# Patient Record
Sex: Male | Born: 1945
Health system: Southern US, Community
[De-identification: ages and names within clinical notes are randomized; demographics above are authoritative.]

## PROBLEM LIST (undated history)

## (undated) DIAGNOSIS — I10 Essential (primary) hypertension: Secondary | ICD-10-CM

## (undated) DIAGNOSIS — E119 Type 2 diabetes mellitus without complications: Secondary | ICD-10-CM

## (undated) DIAGNOSIS — F528 Other sexual dysfunction not due to a substance or known physiological condition: Secondary | ICD-10-CM

## (undated) DIAGNOSIS — H6500 Acute serous otitis media, unspecified ear: Secondary | ICD-10-CM

## (undated) DIAGNOSIS — E669 Obesity, unspecified: Secondary | ICD-10-CM

## (undated) DIAGNOSIS — E785 Hyperlipidemia, unspecified: Secondary | ICD-10-CM

## (undated) DIAGNOSIS — R112 Nausea with vomiting, unspecified: Secondary | ICD-10-CM

## (undated) DIAGNOSIS — M1990 Unspecified osteoarthritis, unspecified site: Secondary | ICD-10-CM

## (undated) DIAGNOSIS — K219 Gastro-esophageal reflux disease without esophagitis: Secondary | ICD-10-CM

## (undated) DIAGNOSIS — Z9889 Other specified postprocedural states: Secondary | ICD-10-CM

## (undated) HISTORY — PX: TONSILLECTOMY: SUR1361

## (undated) HISTORY — DX: Gastro-esophageal reflux disease without esophagitis: K21.9

## (undated) HISTORY — DX: Other sexual dysfunction not due to a substance or known physiological condition: F52.8

## (undated) HISTORY — DX: Essential (primary) hypertension: I10

## (undated) HISTORY — DX: Type 2 diabetes mellitus without complications: E11.9

## (undated) HISTORY — DX: Hyperlipidemia, unspecified: E78.5

## (undated) HISTORY — DX: Obesity, unspecified: E66.9

## (undated) HISTORY — DX: Acute serous otitis media, unspecified ear: H65.00

---

## 1972-12-02 HISTORY — PX: NASAL SEPTUM SURGERY: SHX37

## 2002-12-17 LAB — HM COLONOSCOPY

## 2004-03-21 ENCOUNTER — Ambulatory Visit (HOSPITAL_COMMUNITY): Admission: RE | Admit: 2004-03-21 | Discharge: 2004-03-21 | Payer: Self-pay | Admitting: Internal Medicine

## 2007-12-31 ENCOUNTER — Encounter: Payer: Self-pay | Admitting: Family Medicine

## 2008-01-07 ENCOUNTER — Encounter: Payer: Self-pay | Admitting: Family Medicine

## 2009-03-15 ENCOUNTER — Ambulatory Visit: Payer: Self-pay | Admitting: Family Medicine

## 2009-03-15 LAB — CONVERTED CEMR LAB
ALT: 33 units/L (ref 0–53)
Alkaline Phosphatase: 67 units/L (ref 39–117)
BUN: 20 mg/dL (ref 6–23)
Basophils Relative: 0.3 % (ref 0.0–3.0)
Bilirubin Urine: NEGATIVE
Bilirubin, Direct: 0 mg/dL (ref 0.0–0.3)
Blood in Urine, dipstick: NEGATIVE
Calcium: 10.1 mg/dL (ref 8.4–10.5)
Cholesterol: 237 mg/dL — ABNORMAL HIGH (ref 0–200)
Creatinine, Ser: 1 mg/dL (ref 0.4–1.5)
Direct LDL: 167.7 mg/dL
Eosinophils Absolute: 0.1 10*3/uL (ref 0.0–0.7)
Eosinophils Relative: 1.5 % (ref 0.0–5.0)
Glucose, Urine, Semiquant: NEGATIVE
HDL: 50.2 mg/dL (ref >39.00)
Ketones, urine, test strip: NEGATIVE
Lymphocytes Relative: 27.3 % (ref 12.0–46.0)
Monocytes Absolute: 0.7 10*3/uL (ref 0.1–1.0)
Neutrophils Relative %: 63.4 % (ref 43.0–77.0)
PSA: 0.93 ng/mL (ref 0.10–4.00)
Platelets: 268 10*3/uL (ref 150.0–400.0)
RBC: 5.56 M/uL (ref 4.22–5.81)
Total Bilirubin: 0.9 mg/dL (ref 0.3–1.2)
Total CHOL/HDL Ratio: 5
Total Protein: 7 g/dL (ref 6.0–8.3)
Triglycerides: 132 mg/dL (ref 0.0–149.0)
Urobilinogen, UA: 0.2
VLDL: 26.4 mg/dL (ref 0.0–40.0)
WBC: 9.3 10*3/uL (ref 4.5–10.5)
pH: 7.5

## 2009-03-24 ENCOUNTER — Ambulatory Visit: Payer: Self-pay | Admitting: Family Medicine

## 2009-03-24 DIAGNOSIS — I10 Essential (primary) hypertension: Secondary | ICD-10-CM

## 2009-03-24 DIAGNOSIS — K219 Gastro-esophageal reflux disease without esophagitis: Secondary | ICD-10-CM

## 2009-03-24 HISTORY — DX: Essential (primary) hypertension: I10

## 2009-03-24 HISTORY — DX: Gastro-esophageal reflux disease without esophagitis: K21.9

## 2009-04-24 ENCOUNTER — Telehealth (INDEPENDENT_AMBULATORY_CARE_PROVIDER_SITE_OTHER): Payer: Self-pay | Admitting: *Deleted

## 2009-04-25 ENCOUNTER — Encounter: Payer: Self-pay | Admitting: Family Medicine

## 2009-04-25 DIAGNOSIS — E669 Obesity, unspecified: Secondary | ICD-10-CM

## 2009-04-25 DIAGNOSIS — F528 Other sexual dysfunction not due to a substance or known physiological condition: Secondary | ICD-10-CM

## 2009-04-25 HISTORY — DX: Obesity, unspecified: E66.9

## 2009-04-25 HISTORY — DX: Other sexual dysfunction not due to a substance or known physiological condition: F52.8

## 2010-02-22 ENCOUNTER — Ambulatory Visit: Payer: Self-pay | Admitting: Family Medicine

## 2010-02-22 DIAGNOSIS — H6500 Acute serous otitis media, unspecified ear: Secondary | ICD-10-CM

## 2010-02-22 HISTORY — DX: Acute serous otitis media, unspecified ear: H65.00

## 2010-03-14 ENCOUNTER — Encounter: Payer: Self-pay | Admitting: Family Medicine

## 2010-03-22 ENCOUNTER — Ambulatory Visit: Payer: Self-pay | Admitting: Family Medicine

## 2010-03-22 LAB — CONVERTED CEMR LAB
ALT: 31 U/L
AST: 21 U/L
Albumin: 4.5 g/dL
Alkaline Phosphatase: 52 U/L
BUN: 19 mg/dL
Basophils Absolute: 0 K/uL
Basophils Relative: 0.4 %
Bilirubin Urine: NEGATIVE
Bilirubin, Direct: 0 mg/dL
Blood in Urine, dipstick: NEGATIVE
CO2: 27 meq/L
Calcium: 9.5 mg/dL
Chloride: 105 meq/L
Cholesterol: 240 mg/dL — ABNORMAL HIGH
Creatinine, Ser: 1 mg/dL
Direct LDL: 178.6 mg/dL
Eosinophils Absolute: 0.1 K/uL
Eosinophils Relative: 1.6 %
GFR calc non Af Amer: 79.64 mL/min
Glucose, Bld: 106 mg/dL — ABNORMAL HIGH
Glucose, Urine, Semiquant: NEGATIVE
HCT: 46.8 %
HDL: 47.3 mg/dL
Hemoglobin: 15.9 g/dL
Ketones, urine, test strip: NEGATIVE
Lymphocytes Relative: 30.8 %
Lymphs Abs: 2.3 K/uL
MCHC: 33.9 g/dL
MCV: 87.8 fL
Monocytes Absolute: 0.6 K/uL
Monocytes Relative: 8.1 %
Neutro Abs: 4.4 K/uL
Neutrophils Relative %: 59.1 %
Nitrite: NEGATIVE
PSA: 0.96 ng/mL
Platelets: 274 K/uL
Potassium: 4.1 meq/L
RBC: 5.32 M/uL
RDW: 14.6 %
Sodium: 139 meq/L
Specific Gravity, Urine: 1.02
TSH: 0.98 u[IU]/mL
Testosterone: 378.62 ng/dL
Total Bilirubin: 0.5 mg/dL
Total CHOL/HDL Ratio: 5
Total Protein: 7 g/dL
Triglycerides: 186 mg/dL — ABNORMAL HIGH
Urobilinogen, UA: 0.2
VLDL: 37.2 mg/dL
WBC Urine, dipstick: NEGATIVE
WBC: 7.4 10*3/microliter
pH: 7

## 2010-03-28 ENCOUNTER — Ambulatory Visit: Payer: Self-pay | Admitting: Family Medicine

## 2010-03-28 DIAGNOSIS — E785 Hyperlipidemia, unspecified: Secondary | ICD-10-CM

## 2010-03-28 HISTORY — DX: Hyperlipidemia, unspecified: E78.5

## 2010-04-26 ENCOUNTER — Ambulatory Visit: Payer: Self-pay | Admitting: Family Medicine

## 2010-05-31 ENCOUNTER — Telehealth: Payer: Self-pay | Admitting: Family Medicine

## 2010-07-19 ENCOUNTER — Ambulatory Visit: Payer: Self-pay | Admitting: Family Medicine

## 2011-01-01 NOTE — Assessment & Plan Note (Signed)
Summary: fup//ccm   Vital Signs:  Patient profile:   65 year old male Weight:      220 pounds Temp:     97.8 degrees F oral BP sitting:   138 / 88  (left arm) Cuff size:   regular  Vitals Entered By: Sid Falcon LPN (July 19, 2010 1:10 PM)  Serial Vital Signs/Assessments:  Time      Position  BP       Pulse  Resp  Temp     By                     138/82                         Evelena Peat MD   History of Present Illness: Follow up hypertension. Patient takes Diovan HCTZ. We have suspected white coat syndrome. Home blood pressures reviewed and consistently good control. He brings home cuff with him today and  consistent readings with our cuff here. He feels well. Good energy. No chest pains, dizziness, or palpitations.  Needs refills Viagra.  Allergies: 1)  ! Penicillin V Potassium (Penicillin V Potassium)  Past History:  Past Medical History: Last updated: 04/25/2009 GERD Hypertension Hay fever, allergies erectile dysfunction moderate obesity  Review of Systems      See HPI  Physical Exam  General:  Well-developed,well-nourished,in no acute distress; alert,appropriate and cooperative throughout examination Neck:  No deformities, masses, or tenderness noted. Lungs:  Normal respiratory effort, chest expands symmetrically. Lungs are clear to auscultation, no crackles or wheezes. Heart:  Normal rate and regular rhythm. S1 and S2 normal without gallop, murmur, click, rub or other extra sounds. Extremities:  no edema.   Impression & Recommendations:  Problem # 1:  HYPERTENSION (ICD-401.9) Assessment Unchanged controlled by home readings. His updated medication list for this problem includes:    Diovan Hct 160-12.5 Mg Tabs (Valsartan-hydrochlorothiazide) ..... One by mouth daily  Problem # 2:  ERECTILE DYSFUNCTION (ICD-302.72)  His updated medication list for this problem includes:    Viagra 100 Mg Tabs (Sildenafil citrate) ..... One daily as  needed  Complete Medication List: 1)  Diovan Hct 160-12.5 Mg Tabs (Valsartan-hydrochlorothiazide) .... One by mouth daily 2)  Androgel Pump 1 % Gel (Testosterone) .... Once daily 3)  Fluticasone Propionate 50 Mcg/act Susp (Fluticasone propionate) .... One spray to each nostril once daily 4)  Viagra 100 Mg Tabs (Sildenafil citrate) .... One daily as needed 5)  Betamethasone Valerate 0.1 % Crea (Betamethasone valerate) .... Apply to affected rash two times a day as needed 6)  Omeprazole Magnesium 20.6 (20 Base) Mg Cpdr (Omeprazole magnesium) .... Once daily Prescriptions: VIAGRA 100 MG TABS (SILDENAFIL CITRATE) one daily as needed  #10 x 11   Entered and Authorized by:   Evelena Peat MD   Signed by:   Evelena Peat MD on 07/19/2010   Method used:   Faxed to ...       [facility]       [address]       [place], [place]  [address]       Ph: [phone] or [phone]       Fax: [phone]   RxID:   [id number]

## 2011-01-01 NOTE — Medication Information (Signed)
Summary: Coverage Approval for Androgel  Coverage Approval for Androgel   Imported By: Maryln Gottron 03/20/2010 09:28:39  _____________________________________________________________________  External Attachment:    Type:   Image     Comment:   External Document

## 2011-01-01 NOTE — Assessment & Plan Note (Signed)
Summary: cpx/njr   Vital Signs:  Patient profile:   65 year old male Height:      67.5 inches Weight:      220 pounds BMI:     34.07 O2 Sat:      98 % Temp:     98.5 degrees F oral Pulse rate:   76 / minute Pulse rhythm:   regular Resp:     16 per minute BP sitting:   156 / 88  Vitals Entered By: Lynann Beaver CMA (March 28, 2010 10:41 AM)  Nutrition Counseling: Patient's BMI is greater than 25 and therefore counseled on weight management options. CC: cpx Is Patient Diabetic? No Pain Assessment Patient in pain? no        History of Present Illness: Here for CPE.  Overall doing well. Colonoscopy 2005 normal   Prior Zostavax 2009. Tdap 2009. Not exercisiing regularly.  PMH, SH , AND FH reviewed and no signif changes.  Current Medications (verified): 1)  Diovan 80 Mg Tabs (Valsartan) .... Once Daily 2)  Androgel Pump 1 % Gel (Testosterone) .... Once Daily 3)  Fluticasone Propionate 50 Mcg/act Susp (Fluticasone Propionate) .... One Spray To Each Nostril Once Daily 4)  Viagra 100 Mg Tabs (Sildenafil Citrate) .... One Daily As Needed 5)  Betamethasone Valerate 0.1 % Crea (Betamethasone Valerate) .... Apply To Affected Rash Two Times A Day As Needed 6)  Omeprazole Magnesium 20.6 (20 Base) Mg Cpdr (Omeprazole Magnesium) .... Once Daily  Allergies (verified): 1)  ! Penicillin V Potassium (Penicillin V Potassium)  Past History:  Past Medical History: Last updated: 04/25/2009 GERD Hypertension Hay fever, allergies erectile dysfunction moderate obesity  Family History: Last updated: 03/24/2009 scleroderma mother dementia  father  Social History: Last updated: 03/24/2009 Occupation:  Education Married Never Smoked Alcohol use-no  Risk Factors: Smoking Status: never (03/24/2009)  Review of Systems  The patient denies anorexia, fever, weight loss, weight gain, vision loss, decreased hearing, hoarseness, chest pain, syncope, dyspnea on exertion, peripheral  edema, prolonged cough, headaches, hemoptysis, abdominal pain, melena, hematochezia, severe indigestion/heartburn, hematuria, incontinence, genital sores, muscle weakness, suspicious skin lesions, transient blindness, difficulty walking, depression, unusual weight change, abnormal bleeding, enlarged lymph nodes, and testicular masses.    Physical Exam  General:  Well-developed,well-nourished,in no acute distress; alert,appropriate and cooperative throughout examination Head:  Normocephalic and atraumatic without obvious abnormalities. No apparent alopecia or balding. Eyes:  pupils equal, pupils round, and pupils reactive to light.   Ears:  External ear exam shows no significant lesions or deformities.  Otoscopic examination reveals clear canals, tympanic membranes are intact bilaterally without bulging, retraction, inflammation or discharge. Hearing is grossly normal bilaterally. Mouth:  Oral mucosa and oropharynx without lesions or exudates.  Teeth in good repair. Neck:  No deformities, masses, or tenderness noted. Chest Wall:  No deformities, masses, tenderness or gynecomastia noted. Lungs:  Normal respiratory effort, chest expands symmetrically. Lungs are clear to auscultation, no crackles or wheezes. Heart:  Normal rate and regular rhythm. S1 and S2 normal without gallop, murmur, click, rub or other extra sounds. Abdomen:  Bowel sounds positive,abdomen soft and non-tender without masses, organomegaly or hernias noted. Rectal:  No external abnormalities noted. Normal sphincter tone. No rectal masses or tenderness. Prostate:  Prostate gland firm and smooth, no enlargement, nodularity, tenderness, mass, asymmetry or induration. Msk:  No deformity or scoliosis noted of thoracic or lumbar spine.   Extremities:  No clubbing, cyanosis, edema, or deformity noted with normal full range of motion of all joints.  Neurologic:  alert & oriented X3, cranial nerves II-XII intact, strength normal in all  extremities, and gait normal.   Skin:  Intact without suspicious lesions or rashes Cervical Nodes:  No lymphadenopathy noted Psych:  normally interactive, good eye contact, not anxious appearing, and not depressed appearing.     Impression & Recommendations:  Problem # 1:  ROUTINE GENERAL MEDICAL EXAM@HEALTH  CARE FACL (ICD-V70.0) labs reviewed with pt.  No signif changes since last year.  Needs to lose some weight and work on more regular exercise. Colonoscopy in 4 years.  PVX is given.  EKG sinus rhythm with no acute changes.  Problem # 2:  HYPERTENSION (ICD-401.9) Assessment: Deteriorated Increase Diovan to 160 mg by mouth once daily and reassess in one month. His updated medication list for this problem includes:    Diovan 160 Mg Tabs (Valsartan) ..... One by mouth once daily  Orders: EKG w/ Interpretation (93000)  Complete Medication List: 1)  Diovan 160 Mg Tabs (Valsartan) .... One by mouth once daily 2)  Androgel Pump 1 % Gel (Testosterone) .... Once daily 3)  Fluticasone Propionate 50 Mcg/act Susp (Fluticasone propionate) .... One spray to each nostril once daily 4)  Viagra 100 Mg Tabs (Sildenafil citrate) .... One daily as needed 5)  Betamethasone Valerate 0.1 % Crea (Betamethasone valerate) .... Apply to affected rash two times a day as needed 6)  Omeprazole Magnesium 20.6 (20 Base) Mg Cpdr (Omeprazole magnesium) .... Once daily  Other Orders: Pneumococcal Vaccine (13086) Admin 1st Vaccine (57846)  Patient Instructions: 1)  Please schedule a follow-up appointment in 1 month.  2)  It is important that you exercise reguarly at least 20 minutes 5 times a week. If you develop chest pain, have severe difficulty breathing, or feel very tired, stop exercising immediately and seek medical attention.  3)  You need to lose weight. Consider a lower calorie diet and regular exercise.  Prescriptions: DIOVAN 160 MG TABS (VALSARTAN) one by mouth once daily  #30 x 11   Entered and  Authorized by:   Evelena Peat MD   Signed by:   Evelena Peat MD on 03/28/2010   Method used:   Faxed to ...       [facility]ail[facility]       [address]       [place], [place]  [address]       Ph: [phone] or [phone]       Fax: [phone]   RxID:   [id number]    Preventive Care Screening  Last Pneumovax:    Date:  03/28/2010    Results:  given   Immunizations Administered:  Pneumonia Vaccine:    Vaccine Type: Pneumovax    Site: right deltoid    Mfr: Merck    Dose: 0.5 ml    Route: [place]    Given by: Lynann Beaver CMA    Exp. Date: 03/22/2011    Lot #: [id number]

## 2011-01-01 NOTE — Assessment & Plan Note (Signed)
Summary: ear pain/cb   Vital Signs:  Patient profile:   65 year old male BP sitting:   140 / 82  (left arm) Cuff size:   regular  Vitals Entered By: Sid Falcon LPN (February 22, 2010 8:43 AM) CC: left ear pain, wax build-up?   History of Present Illness: L ear pain for 2 weeks.  Rx Z-pack at Urgent care 2 weeks ago. No better.  Sensation of ear closing up off and on.  moderate pain.  No signif vertigo.  ?mild hearing loss.  No fever  Needs refill androderm and to schedule CPE.  Allergies: 1)  ! Penicillin V Potassium (Penicillin V Potassium)  Past History:  Past Medical History: Last updated: 04/25/2009 GERD Hypertension Hay fever, allergies erectile dysfunction moderate obesity PMH reviewed for relevance  Review of Systems      See HPI  Physical Exam  General:  Well-developed,well-nourished,in no acute distress; alert,appropriate and cooperative throughout examination Ears:  minimal cerumen canals.  minimal L effusion o/w normal. Nose:  External nasal examination shows no deformity or inflammation. Nasal mucosa are pink and moist without lesions or exudates. Mouth:  Oral mucosa and oropharynx without lesions or exudates.  Teeth in good repair.   Impression & Recommendations:  Problem # 1:  ACUTE SEROUS OTITIS MEDIA (ICD-381.01) Assessment New add allegra to his Flonase. ?allergic precipitant.  No supperative changes on exam.  Problem # 2:  Preventive Health Care (ICD-V70.0) scedule CPE.  Complete Medication List: 1)  Diovan 80 Mg Tabs (Valsartan) .... Once daily 2)  Androgel Pump 1 % Gel (Testosterone) .... Once daily 3)  Fluticasone Propionate 50 Mcg/act Susp (Fluticasone propionate) .... One spray to each nostril once daily 4)  Viagra 100 Mg Tabs (Sildenafil citrate) .... One daily as needed 5)  Betamethasone Valerate 0.1 % Crea (Betamethasone valerate) .... Apply to affected rash two times a day as needed 6)  Omeprazole Magnesium 20.6 (20 Base) Mg Cpdr  (Omeprazole magnesium) .... Once daily  Patient Instructions: 1)  Please schedule a follow-up appointment in 1 month.  2)  Schedule CPE then and add tetosterone level:  302.72 3)  Add Allegra once daily for ear symptoms. Prescriptions: ANDROGEL PUMP 1 % GEL (TESTOSTERONE) once daily  #1 month x 11   Entered and Authorized by:   Evelena Peat MD   Signed by:   Evelena Peat MD on 02/22/2010   Method used:   Print then Give to Patient   RxID:   [id number]

## 2011-01-01 NOTE — Progress Notes (Signed)
Summary: BP readings  Phone Note Call from Patient   Caller: Patient Call For: Evelena Peat MD Summary of Call: BP 156/83, 148/80  averaging the same. Headache today. [phone]  [phone] Initial call taken by: Lynann Beaver CMA,  May 31, 2010 1:58 PM  Follow-up for Phone Call        change to Diovan HCTZ 160/12.5 mg one by mouth once daily and pt needs office follow up within one month to reassess. Follow-up by: Evelena Peat MD,  May 31, 2010 6:16 PM    New/Updated Medications: DIOVAN HCT 160-12.5 MG TABS (VALSARTAN-HYDROCHLOROTHIAZIDE) one by mouth daily Prescriptions: DIOVAN HCT 160-12.5 MG TABS (VALSARTAN-HYDROCHLOROTHIAZIDE) one by mouth daily  #30 x 6   Entered by:   Lynann Beaver CMA   Authorized by:   Evelena Peat MD   Signed by:   Lynann Beaver CMA on 06/01/2010   Method used:   Faxed to ...       [facility] (retail)       [address]       [place], [place]  [address]       Ph: [phone] or [phone]       Fax: [phone]   RxID:   [id number]  Pt notified.

## 2011-01-01 NOTE — Assessment & Plan Note (Signed)
Summary: Follow up/ns/cb   Vital Signs:  Patient profile:   65 year old male Temp:     98.0 degrees F oral BP sitting:   158 / 92  (left arm) Cuff size:   large  Vitals Entered By: Sid Falcon LPN (Apr 26, 2010 1:16 PM)  Serial Vital Signs/Assessments:  Time      Position  BP       Pulse  Resp  Temp     By                     150/82                         Evelena Peat MD  CC: BP med check, Hypertension Management   History of Present Illness: Patient here for followup hypertension. Well controlled by home readings with 130 systolic and around 80 diastolic. No side effects from increased dosage of Diovan. Compliant with therapy. Exercising more and has lost a few pounds. Denies chest pains or dizziness.  Hypertension History:      He denies headache, chest pain, palpitations, dyspnea with exertion, orthopnea, peripheral edema, visual symptoms, neurologic problems, and syncope.        Positive major cardiovascular risk factors include male age 51 years old or older, hyperlipidemia, and hypertension.  Negative major cardiovascular risk factors include non-tobacco-user status.     Allergies: 1)  ! Penicillin V Potassium (Penicillin V Potassium)  Past History:  Past Medical History: Last updated: 04/25/2009 GERD Hypertension Hay fever, allergies erectile dysfunction moderate obesity  Review of Systems      See HPI  Physical Exam  General:  Well-developed,well-nourished,in no acute distress; alert,appropriate and cooperative throughout examination Mouth:  Oral mucosa and oropharynx without lesions or exudates.  Teeth in good repair. Neck:  No deformities, masses, or tenderness noted. Lungs:  Normal respiratory effort, chest expands symmetrically. Lungs are clear to auscultation, no crackles or wheezes. Heart:  Normal rate and regular rhythm. S1 and S2 normal without gallop, murmur, click, rub or other extra sounds.   Impression & Recommendations:  Problem # 1:   HYPERTENSION (ICD-401.9) probable whitecoat syndrome. Continue close monitoring at home. Reassess in 3 months. His updated medication list for this problem includes:    Diovan 160 Mg Tabs (Valsartan) ..... One by mouth once daily  Complete Medication List: 1)  Diovan 160 Mg Tabs (Valsartan) .... One by mouth once daily 2)  Androgel Pump 1 % Gel (Testosterone) .... Once daily 3)  Fluticasone Propionate 50 Mcg/act Susp (Fluticasone propionate) .... One spray to each nostril once daily 4)  Viagra 100 Mg Tabs (Sildenafil citrate) .... One daily as needed 5)  Betamethasone Valerate 0.1 % Crea (Betamethasone valerate) .... Apply to affected rash two times a day as needed 6)  Omeprazole Magnesium 20.6 (20 Base) Mg Cpdr (Omeprazole magnesium) .... Once daily  Hypertension Assessment/Plan:      The patient's hypertensive risk group is category B: At least one risk factor (excluding diabetes) with no target organ damage.  Today's blood pressure is 158/92.    Patient Instructions: 1)  Please schedule a follow-up appointment in 3 months .  2)  It is important that you exercise reguarly at least 20 minutes 5 times a week. If you develop chest pain, have severe difficulty breathing, or feel very tired, stop exercising immediately and seek medical attention.

## 2011-02-07 ENCOUNTER — Other Ambulatory Visit: Payer: Self-pay | Admitting: Family Medicine

## 2011-04-19 NOTE — Op Note (Signed)
NAME:  Clifford Gibson, Clifford Gibson                       ACCOUNT NO.:  [id number]   MEDICAL RECORD NO.:  [id number]                   PATIENT TYPE:  AMB   LOCATION:  DAY                                  FACILITY:  [facility]   PHYSICIAN:  Lionel December, M.D.                 DATE OF BIRTH:  05-23-1946   DATE OF PROCEDURE:  03/21/2004  DATE OF DISCHARGE:                                 OPERATIVE REPORT   PROCEDURE:  Total colonoscopy.   INDICATIONS FOR PROCEDURE:  Effie is a 65 year old Caucasian male who is  here for screening colonoscopy.  His family history is negative for  colorectal carcinoma.  The procedure and risks were reviewed with the  patient, and informed consent was obtained.   PREOPERATIVE MEDICATIONS:  Demerol 25 mg IV, Versed 4 mg IV.   FINDINGS:  The procedure was performed in the endoscopy suite.  The  patient's vital signs and O2 saturations were monitored during the  procedure.  He was noted to have single PVCs.  He had a run of bigeminy  without any symptoms.  He did tell us that he has history of PVCs.   The patient was placed in the left lateral recumbent position and rectal  examination performed.  No abnormality noted on external or digital exam.  The Olympus videoscope was placed into the rectum and advanced into the  region of the sigmoid colon and beyond.  The preparation was excellent.  The  scope was passed to the cecum which was identified by the appendiceal  orifice and ileocecal valve.  As the scope was withdrawn, the colonic mucosa  was once again carefully examined, and no polyps, tumor masses, or other  abnormalities were noted.  The rectal mucosa similarly was normal.  The  scope was retroflexed to examine the anorectal junction, and thickened anal  papillae were noted.  The endoscope was straightened and withdrawn.  The  patient tolerated the procedure well.   FINAL DIAGNOSIS:  Normal colonoscopy.   RECOMMENDATIONS:  1. He should continue yearly  Hemoccults.  2. He may consider next screening exam in 10 years from now.      ___________________________________________                                            Lionel December, M.D.   NR/MEDQ  D:  03/21/2004  T:  03/21/2004  Job:  [id number]   cc:   Weyman Pedro, M.D.  [place], [place]

## 2011-06-17 ENCOUNTER — Ambulatory Visit (INDEPENDENT_AMBULATORY_CARE_PROVIDER_SITE_OTHER): Payer: Medicare Other | Admitting: Family Medicine

## 2011-06-17 ENCOUNTER — Encounter: Payer: Self-pay | Admitting: Family Medicine

## 2011-06-17 VITALS — BP 128/82 | Temp 98.3°F | Wt 228.0 lb

## 2011-06-17 DIAGNOSIS — R42 Dizziness and giddiness: Secondary | ICD-10-CM

## 2011-06-17 MED ORDER — VALSARTAN-HYDROCHLOROTHIAZIDE 160-12.5 MG PO TABS: 1.0000 | ORAL_TABLET | Freq: Every day | ORAL | Status: DC

## 2011-06-17 NOTE — Progress Notes (Signed)
  Subjective:    Patient ID: Clifford Gibson, male    DOB: 1946-03-28, 65 y.o.   MRN: [id number]  HPI Patient seen with intermittent vertigo for several years. About 40 years ago had initial episode. Now has occurrences which usually last 2-3 days. Occasional nausea but no vomiting. No hearing changes. No tinnitus. Has taken meclizine and scopolamine in past but had sedation with both. Has previously seen ENT surgeons with no successful treatment. Question history acute vestibular neuritis versus acute labyrinthitis initially several years ago. No syncopal or presyncopal symptoms. Denies recent headache.   Review of Systems  Constitutional: Negative for fever, chills, fatigue and unexpected weight change.  HENT: Negative for hearing loss, ear pain, tinnitus and ear discharge.   Cardiovascular: Negative for chest pain.  Neurological: Positive for dizziness. Negative for tremors, syncope, weakness, light-headedness and headaches.  Hematological: Negative for adenopathy.       Objective:   Physical Exam  Constitutional: He is oriented to person, place, and time. He appears well-developed and well-nourished. No distress.  Cardiovascular: Normal rate, regular rhythm and normal heart sounds.   Pulmonary/Chest: Effort normal and breath sounds normal. No respiratory distress. He has no wheezes. He has no rales.  Neurological: He is alert and oriented to person, place, and time. No cranial nerve deficit. He exhibits normal muscle tone.       Cerebellar normal by finger to nose testing No focal strength deficits  Psychiatric: He has a normal mood and affect. His behavior is normal.          Assessment & Plan:  Chronic recurrent vertigo probably following acute vestibular neuritis several years ago. Discussed medication options for flare but limitations of sedation with each. Doubt that he would benefit from vestibular rehabilitation as episodes are acute and very limited.  No red flags for  acute change of symptoms

## 2011-06-17 NOTE — Patient Instructions (Signed)
Follow up promptly for any hearing loss or any new symptoms.

## 2011-09-18 ENCOUNTER — Other Ambulatory Visit: Payer: Self-pay | Admitting: Family Medicine

## 2011-10-25 ENCOUNTER — Encounter: Payer: Self-pay | Admitting: Family Medicine

## 2011-10-25 ENCOUNTER — Ambulatory Visit (INDEPENDENT_AMBULATORY_CARE_PROVIDER_SITE_OTHER): Payer: Medicare Other | Admitting: Family Medicine

## 2011-10-25 DIAGNOSIS — I10 Essential (primary) hypertension: Secondary | ICD-10-CM

## 2011-10-25 DIAGNOSIS — Z0000 Encounter for general adult medical examination without abnormal findings: Secondary | ICD-10-CM

## 2011-10-25 DIAGNOSIS — E785 Hyperlipidemia, unspecified: Secondary | ICD-10-CM

## 2011-10-25 DIAGNOSIS — K219 Gastro-esophageal reflux disease without esophagitis: Secondary | ICD-10-CM

## 2011-10-25 LAB — HEPATIC FUNCTION PANEL
ALT: 29 U/L (ref 0–53)
AST: 21 U/L (ref 0–37)
Bilirubin, Direct: 0 mg/dL (ref 0.0–0.3)
Total Bilirubin: 0.6 mg/dL (ref 0.3–1.2)
Total Protein: 7.1 g/dL (ref 6.0–8.3)

## 2011-10-25 LAB — BASIC METABOLIC PANEL
BUN: 18 mg/dL (ref 6–23)
Chloride: 103 mEq/L (ref 96–112)
Glucose, Bld: 125 mg/dL — ABNORMAL HIGH (ref 70–99)
Potassium: 5 mEq/L (ref 3.5–5.1)
Sodium: 141 mEq/L (ref 135–145)

## 2011-10-25 LAB — LIPID PANEL
Cholesterol: 223 mg/dL — ABNORMAL HIGH (ref 0–200)
HDL: 49.2 mg/dL (ref >39.00)
Total CHOL/HDL Ratio: 5
VLDL: 36.2 mg/dL (ref 0.0–40.0)

## 2011-10-25 LAB — LDL CHOLESTEROL, DIRECT: Direct LDL: 162.8 mg/dL

## 2011-10-25 NOTE — Progress Notes (Signed)
Subjective:    Patient ID: Clifford Gibson, male    DOB: 1946-02-17, 65 y.o.   MRN: [id number]  HPI  Patient seen for Medicare wellness exam and medical followup. Medical problems include hyperlipidemia, obesity, prediabetes, erectile dysfunction, hypertension, and GERD. Medications reviewed. Compliant with all. Blood pressure stable. No symptoms of hyperglycemia.  Denies side effects from medications.  No consistent exercise.  Immunizations reviewed. All up to date. Colonoscopy 2004. Not exercising regularly.  1.  Risk factors based on Past Medical , Social, and Family history reviewed as below 2.  Limitations in physical activities no limitation in activities. Low risk for fall 3.  Depression/mood no depression or anxiety issues 4.  Hearing no deficits 5.  ADLs fully independent in all 6.  Cognitive function (orientation to time and place, language, writing, speech,memory) no short or long-term memory deficits. Language intact. Judgment intact 7.  Home Safety no issues identified 8.  Height, weight, and visual acuity. Height and weight stable. Vision stable 9.  Counseling needs to lose weight and exercise more. 10. Recommendation of preventive services. Immunizations up to date. Discussed importance of more regular exercise. 11. Labs based on risk factors based metabolic panel, lipid panel, and hepatic panel 12. Care Plan as above  Past Medical History  Diagnosis Date  . HYPERLIPIDEMIA 03/28/2010  . OBESITY, MODERATE 04/25/2009  . ERECTILE DYSFUNCTION 04/25/2009  . Acute serous otitis media 02/22/2010  . HYPERTENSION 03/24/2009  . GERD 03/24/2009   No past surgical history on file.  reports that he has never smoked. He has never used smokeless tobacco. He reports that he does not drink alcohol. His drug history not on file. family history includes Dementia in his father and Scleroderma in his mother. Allergies  Allergen Reactions  . Penicillins        Review of Systems    Constitutional: Negative for fever, activity change, appetite change and fatigue.  HENT: Negative for ear pain, congestion and trouble swallowing.   Eyes: Negative for pain and visual disturbance.  Respiratory: Negative for cough, shortness of breath and wheezing.   Cardiovascular: Negative for chest pain and palpitations.  Gastrointestinal: Negative for nausea, vomiting, abdominal pain, diarrhea, constipation, blood in stool, abdominal distention and rectal pain.  Genitourinary: Negative for dysuria, hematuria and testicular pain.  Musculoskeletal: Negative for joint swelling and arthralgias.  Skin: Negative for rash.  Neurological: Negative for dizziness, syncope and headaches.  Hematological: Negative for adenopathy.  Psychiatric/Behavioral: Negative for confusion and dysphoric mood.       Objective:   Physical Exam  Constitutional: He is oriented to person, place, and time. He appears well-developed and well-nourished. No distress.  HENT:  Head: Normocephalic and atraumatic.  Right Ear: External ear normal.  Left Ear: External ear normal.  Mouth/Throat: Oropharynx is clear and moist.  Eyes: Conjunctivae and EOM are normal. Pupils are equal, round, and reactive to light.  Neck: Normal range of motion. Neck supple. No thyromegaly present.  Cardiovascular: Normal rate, regular rhythm and normal heart sounds.   No murmur heard. Pulmonary/Chest: No respiratory distress. He has no wheezes. He has no rales.  Abdominal: Soft. Bowel sounds are normal. He exhibits no distension and no mass. There is no tenderness. There is no rebound and no guarding.  Musculoskeletal: He exhibits no edema.  Lymphadenopathy:    He has no cervical adenopathy.  Neurological: He is alert and oriented to person, place, and time. He displays normal reflexes. No cranial nerve deficit.  Skin: No rash noted.  Psychiatric: He has a normal mood and affect.          Assessment & Plan:  #1 health  maintenance. Immunizations up to date. Discussed need for weight loss and more consistent exercise. Obtain screening labs #2 hyperlipidemia. Check lipid and hepatic panel  #3 hypertension stable continue Diovan HCTZ.  #4 GERD stable continue omeprazole. #5 history of prediabetes. Reassess fasting sugar. Work on weight loss.

## 2011-10-25 NOTE — Patient Instructions (Signed)
Work on weight loss.

## 2011-10-28 NOTE — Progress Notes (Signed)
Quick Note:  Pt aware ______ 

## 2011-10-28 NOTE — Progress Notes (Signed)
Quick Note:  Left a message for return call. ______ 

## 2012-03-05 ENCOUNTER — Encounter: Payer: Self-pay | Admitting: Family Medicine

## 2012-03-05 ENCOUNTER — Ambulatory Visit (INDEPENDENT_AMBULATORY_CARE_PROVIDER_SITE_OTHER): Payer: Medicare Other | Admitting: Family Medicine

## 2012-03-05 ENCOUNTER — Ambulatory Visit (INDEPENDENT_AMBULATORY_CARE_PROVIDER_SITE_OTHER)
Admission: RE | Admit: 2012-03-05 | Discharge: 2012-03-05 | Disposition: A | Discharge status: Home / Self Care | Payer: Medicare Other | Source: Ambulatory Visit | Attending: Family Medicine | Admitting: Family Medicine

## 2012-03-05 VITALS — BP 150/84 | Temp 98.0°F | Wt 231.0 lb

## 2012-03-05 DIAGNOSIS — M25461 Effusion, right knee: Secondary | ICD-10-CM

## 2012-03-05 DIAGNOSIS — M25469 Effusion, unspecified knee: Secondary | ICD-10-CM

## 2012-03-05 MED ORDER — DICLOFENAC SODIUM 75 MG PO TBEC: 75.0000 mg | DELAYED_RELEASE_TABLET | Freq: Two times a day (BID) | ORAL | Status: DC

## 2012-03-05 NOTE — Progress Notes (Signed)
  Subjective:    Patient ID: Clifford Gibson, male    DOB: [birth year], 66 y.o.   MRN: [id number]  HPI  Right knee pain. Onset in January. No injury. Pain is medial aspect of knee. Moderate severity without radiation. Possibly small edema but no erythema or warmth. No ecchymosis. Pain actually improves somewhat with walking. Occasional Aleve with some relief. No locking or giving way. No prior history of knee difficulty.   Review of Systems  Constitutional: Negative for fever and chills.  Skin: Negative for rash.       Objective:   Physical Exam  Constitutional: He appears well-developed and well-nourished.  Cardiovascular: Normal rate and regular rhythm.   Pulmonary/Chest: Effort normal and breath sounds normal. No respiratory distress. He has no wheezes. He has no rales.  Musculoskeletal:       Right knee reveals small effusion. No ecchymosis. No erythema. No definite warmth. Full range of motion with some crepitus with flexion and extension. Mild to moderate medial joint line tenderness. Ligament testing is normal.          Assessment & Plan:  Small right knee effusion. Differential is degenerative arthritis versus medial meniscus tear. Plain x-rays. Diclofenac 75 mg twice a day with food. Reassess 2 weeks

## 2012-03-19 ENCOUNTER — Encounter: Payer: Self-pay | Admitting: Family Medicine

## 2012-03-19 ENCOUNTER — Ambulatory Visit (INDEPENDENT_AMBULATORY_CARE_PROVIDER_SITE_OTHER): Payer: Medicare Other | Admitting: Family Medicine

## 2012-03-19 VITALS — BP 140/90 | Temp 98.6°F | Wt 231.0 lb

## 2012-03-19 DIAGNOSIS — S83241A Other tear of medial meniscus, current injury, right knee, initial encounter: Secondary | ICD-10-CM

## 2012-03-19 DIAGNOSIS — M25561 Pain in right knee: Secondary | ICD-10-CM

## 2012-03-19 DIAGNOSIS — IMO0002 Reserved for concepts with insufficient information to code with codable children: Secondary | ICD-10-CM

## 2012-03-19 DIAGNOSIS — M25569 Pain in unspecified knee: Secondary | ICD-10-CM

## 2012-03-19 NOTE — Progress Notes (Addendum)
  Subjective:    Patient ID: Clifford Gibson, male    DOB: 01-15-1946, 66 y.o.   MRN: [id number]  HPI  Persistent right knee pain and effusion. This has gone on now for several months. No specific injury. No improvement with NSAIDS. Location is medial. Effusion is not improved. No locking or giving way. Pain has been somewhat progressive since last visit. Moderate pain without radiation.  Past Medical History  Diagnosis Date  . HYPERLIPIDEMIA 03/28/2010  . OBESITY, MODERATE 04/25/2009  . ERECTILE DYSFUNCTION 04/25/2009  . Acute serous otitis media 02/22/2010  . HYPERTENSION 03/24/2009  . GERD 03/24/2009   No past surgical history on file.  reports that he has never smoked. He has never used smokeless tobacco. He reports that he does not drink alcohol. His drug history not on file. family history includes Dementia in his father and Scleroderma in his mother. Allergies  Allergen Reactions  . Penicillins       Review of Systems  Constitutional: Negative for fever and chills.  Musculoskeletal: Positive for joint swelling. Negative for gait problem.       Objective:   Physical Exam  Constitutional: He appears well-developed and well-nourished.  Cardiovascular: Normal rate and regular rhythm.   Musculoskeletal:       Right knee reveals small effusion. Full range of motion. Medial joint line tenderness. Ligament testing is normal. No erythema. Possibly mild warmth.          Assessment & Plan:  Persistent right knee pain and effusion. Rule out medial meniscal tear. Set up MRI scan. May need orthopedic referral.  MRI large R medial meniscus post horn tear. Will set up orthopedic referral.

## 2012-03-20 ENCOUNTER — Ambulatory Visit
Admission: RE | Admit: 2012-03-20 | Discharge: 2012-03-20 | Disposition: A | Discharge status: Home / Self Care | Payer: Medicare Other | Source: Ambulatory Visit | Attending: Family Medicine | Admitting: Family Medicine

## 2012-03-20 DIAGNOSIS — M25561 Pain in right knee: Secondary | ICD-10-CM

## 2012-03-22 NOTE — Progress Notes (Signed)
Addended by: Kristian Covey on: 03/22/2012 12:02 PM   Modules accepted: Orders

## 2012-05-05 ENCOUNTER — Other Ambulatory Visit: Payer: Self-pay | Admitting: Family Medicine

## 2012-05-06 ENCOUNTER — Encounter: Payer: Self-pay | Admitting: Family Medicine

## 2012-05-06 ENCOUNTER — Ambulatory Visit (INDEPENDENT_AMBULATORY_CARE_PROVIDER_SITE_OTHER)
Admission: RE | Admit: 2012-05-06 | Discharge: 2012-05-06 | Disposition: A | Discharge status: Home / Self Care | Payer: Medicare Other | Source: Ambulatory Visit | Attending: Family Medicine | Admitting: Family Medicine

## 2012-05-06 ENCOUNTER — Ambulatory Visit (INDEPENDENT_AMBULATORY_CARE_PROVIDER_SITE_OTHER): Payer: Medicare Other | Admitting: Family Medicine

## 2012-05-06 VITALS — BP 130/60 | HR 80 | Temp 97.8°F | Resp 12 | Wt 230.0 lb

## 2012-05-06 DIAGNOSIS — K219 Gastro-esophageal reflux disease without esophagitis: Secondary | ICD-10-CM

## 2012-05-06 DIAGNOSIS — E785 Hyperlipidemia, unspecified: Secondary | ICD-10-CM

## 2012-05-06 DIAGNOSIS — Z01818 Encounter for other preprocedural examination: Secondary | ICD-10-CM

## 2012-05-06 DIAGNOSIS — I517 Cardiomegaly: Secondary | ICD-10-CM

## 2012-05-06 DIAGNOSIS — I10 Essential (primary) hypertension: Secondary | ICD-10-CM

## 2012-05-06 NOTE — Progress Notes (Addendum)
  Subjective:    Patient ID: Clifford Gibson, male    DOB: February 22, 1946, 66 y.o.   MRN: [id number]  HPI  Patient here for preoperative assessment.. Presented here with right knee injury. X-rays did not show any major degenerative changes but he had ongoing medial knee pain. MRI scan revealed large posterior horn meniscus tear. Patient referred to orthopedist. Apparently had significant loss of joint space with standing views of the knee. He has been scheduled for right total knee replacement. His chronic problems include history of obesity, hypertension, GERD, hyperlipidemia. No cardiac history. No lung problems. No history of smoking. Blood pressures have been well controlled.  Medications reviewed. Compliant with all.  Past Medical History  Diagnosis Date  . HYPERLIPIDEMIA 03/28/2010  . OBESITY, MODERATE 04/25/2009  . ERECTILE DYSFUNCTION 04/25/2009  . Acute serous otitis media 02/22/2010  . HYPERTENSION 03/24/2009  . GERD 03/24/2009   No past surgical history on file.  reports that he has never smoked. He has never used smokeless tobacco. He reports that he does not drink alcohol. His drug history not on file. family history includes Dementia in his father and Scleroderma in his mother. Allergies  Allergen Reactions  . Penicillins       Review of Systems  Constitutional: Negative for fever, chills, appetite change, fatigue and unexpected weight change.  Eyes: Negative for visual disturbance.  Respiratory: Negative for cough, chest tightness and shortness of breath.   Cardiovascular: Negative for chest pain, palpitations and leg swelling.  Gastrointestinal: Negative for abdominal pain.  Neurological: Negative for dizziness, syncope, weakness, light-headedness and headaches.       Objective:   Physical Exam  Constitutional: He is oriented to person, place, and time. He appears well-developed and well-nourished.  HENT:  Mouth/Throat: Oropharynx is clear and moist.  Neck: Neck  supple. No thyromegaly present.       No carotid bruits  Cardiovascular: Normal rate and regular rhythm.  Exam reveals no gallop.   No murmur heard. Pulmonary/Chest: Effort normal and breath sounds normal. No respiratory distress. He has no wheezes. He has no rales.  Abdominal: Soft. He exhibits no distension and no mass. There is no tenderness. There is no rebound and no guarding.  Musculoskeletal: He exhibits no edema.  Lymphadenopathy:    He has no cervical adenopathy.  Neurological: He is alert and oriented to person, place, and time. No cranial nerve deficit.  Psychiatric: He has a normal mood and affect. His behavior is normal.          Assessment & Plan:  Preoperative assessment. Blood pressure well controlled. EKG no acute changes compared to previous tracing 2011. Obtain PA and lateral chest x-ray. No contraindications noted for surgery  Chest x-ray shows cardiomegaly, though poor inspiration. Patient asymptomatic. Set up echocardiogram prior to elective surgery.

## 2012-05-08 NOTE — Progress Notes (Signed)
Addended by: Kristian Covey on: 05/08/2012 08:29 AM   Modules accepted: Orders

## 2012-05-11 ENCOUNTER — Ambulatory Visit (HOSPITAL_COMMUNITY): Payer: Medicare Other | Attending: Cardiology | Admitting: Radiology

## 2012-05-11 DIAGNOSIS — I10 Essential (primary) hypertension: Secondary | ICD-10-CM | POA: Insufficient documentation

## 2012-05-11 DIAGNOSIS — E785 Hyperlipidemia, unspecified: Secondary | ICD-10-CM | POA: Insufficient documentation

## 2012-05-11 DIAGNOSIS — I517 Cardiomegaly: Secondary | ICD-10-CM

## 2012-05-11 DIAGNOSIS — Z01818 Encounter for other preprocedural examination: Secondary | ICD-10-CM

## 2012-05-11 NOTE — Progress Notes (Signed)
Echocardiogram performed.  

## 2012-05-15 ENCOUNTER — Other Ambulatory Visit: Payer: Self-pay | Admitting: Otolaryngology

## 2012-05-29 ENCOUNTER — Encounter (HOSPITAL_COMMUNITY): Payer: Self-pay | Admitting: Pharmacy Technician

## 2012-06-03 ENCOUNTER — Encounter (HOSPITAL_COMMUNITY): Payer: Self-pay

## 2012-06-03 ENCOUNTER — Encounter (HOSPITAL_COMMUNITY)
Admission: RE | Admit: 2012-06-03 | Discharge: 2012-06-03 | Disposition: A | Discharge status: Home / Self Care | Payer: Medicare Other | Source: Ambulatory Visit | Attending: Specialist | Admitting: Specialist

## 2012-06-03 ENCOUNTER — Ambulatory Visit (HOSPITAL_COMMUNITY)
Admission: RE | Admit: 2012-06-03 | Discharge: 2012-06-03 | Disposition: A | Discharge status: Home / Self Care | Payer: Medicare Other | Source: Ambulatory Visit | Attending: Otolaryngology | Admitting: Otolaryngology

## 2012-06-03 DIAGNOSIS — Z01818 Encounter for other preprocedural examination: Secondary | ICD-10-CM | POA: Insufficient documentation

## 2012-06-03 DIAGNOSIS — Z01812 Encounter for preprocedural laboratory examination: Secondary | ICD-10-CM | POA: Insufficient documentation

## 2012-06-03 DIAGNOSIS — M1710 Unilateral primary osteoarthritis, unspecified knee: Secondary | ICD-10-CM | POA: Insufficient documentation

## 2012-06-03 HISTORY — DX: Unspecified osteoarthritis, unspecified site: M19.90

## 2012-06-03 HISTORY — DX: Other specified postprocedural states: R11.2

## 2012-06-03 HISTORY — DX: Other specified postprocedural states: Z98.890

## 2012-06-03 LAB — URINALYSIS, ROUTINE W REFLEX MICROSCOPIC
Glucose, UA: NEGATIVE mg/dL
Leukocytes, UA: NEGATIVE
Nitrite: NEGATIVE
Specific Gravity, Urine: 1.02 (ref 1.005–1.030)
pH: 7 (ref 5.0–8.0)

## 2012-06-03 LAB — CBC
HCT: 44.5 % (ref 39.0–52.0)
MCHC: 34.2 g/dL (ref 30.0–36.0)
Platelets: 296 10*3/uL (ref 150–400)
RDW: 14.3 % (ref 11.5–15.5)
WBC: 9 10*3/uL (ref 4.0–10.5)

## 2012-06-03 LAB — DIFFERENTIAL
Eosinophils Absolute: 0.1 10*3/uL (ref 0.0–0.7)
Lymphs Abs: 2.5 10*3/uL (ref 0.7–4.0)
Monocytes Absolute: 1 10*3/uL (ref 0.1–1.0)
Monocytes Relative: 11 % (ref 3–12)
Neutrophils Relative %: 59 % (ref 43–77)

## 2012-06-03 LAB — COMPREHENSIVE METABOLIC PANEL
AST: 18 U/L (ref 0–37)
Albumin: 4.4 g/dL (ref 3.5–5.2)
Alkaline Phosphatase: 67 U/L (ref 39–117)
BUN: 20 mg/dL (ref 6–23)
Chloride: 100 mEq/L (ref 96–112)
Potassium: 4.3 mEq/L (ref 3.5–5.1)
Sodium: 138 mEq/L (ref 135–145)
Total Bilirubin: 0.4 mg/dL (ref 0.3–1.2)
Total Protein: 7.1 g/dL (ref 6.0–8.3)

## 2012-06-03 LAB — PROTIME-INR: INR: 0.99 (ref 0.00–1.49)

## 2012-06-03 NOTE — Patient Instructions (Signed)
20 Clifford Gibson  06/03/2012   Your procedure is scheduled on:  Thursday 06/11/2012 at 0730am  Report to [facility] at 500 AM.  Call this number if you have problems the morning of surgery: [phone]   Remember:   Do not eat food:After Midnight.  May have clear liquids:until Midnight .  Marland Kitchen  Take these medicines the morning of surgery with A SIP OF WATER: prilosec, use Flonase nasal spray if needed   Do not wear jewelry  Do not wear lotions, powders, or perfumes.   Do not shave 48 hours prior to surgery. Men may shave face and neck.  Do not bring valuables to the hospital.  Contacts, dentures or bridgework may not be worn into surgery.  Leave suitcase in the car. After surgery it may be brought to your room.  For patients admitted to the hospital, checkout time is 11:00 AM the day of discharge.       Special Instructions: CHG Shower Use Special Wash: 1/2 bottle night before surgery and 1/2 bottle morning of surgery.   Please read over the following fact sheets that you were given: MRSA Information, Blood Transfusion sheet, Incentive Spirometry sheet, Sleep apnea sheet               Call me [name], BSN at [phone] if any questions

## 2012-06-03 NOTE — Progress Notes (Signed)
06/03/12 1053  OBSTRUCTIVE SLEEP APNEA  Have you ever been diagnosed with sleep apnea through a sleep study? No  Do you snore loudly (loud enough to be heard through closed doors)?  1  Do you often feel tired, fatigued, or sleepy during the daytime? 0  Has anyone observed you stop breathing during your sleep? 0  Do you have, or are you being treated for high blood pressure? 1  BMI more than 35 kg/m2? 0  Age over 67 years old? 1  Neck circumference greater than 40 cm/18 inches? 0  Gender: 1  Obstructive Sleep Apnea Score 4   Score 4 or greater  Updated health history

## 2012-06-03 NOTE — Pre-Procedure Instructions (Signed)
Reviewed pre-op instructions with patient using Teach back method. 

## 2012-06-04 NOTE — H&P (Signed)
Chief Complaint:  right knee pain.  Subjective: Patient is admitted for right total knee arthroplasty.  Patient is a 66 y.o. male who has been seen by Dr. Shelle Iron for ongoing hip pain.  They have been followed and found to have continued progressive pain.  They have been treated conservatively in the past including medications.  Despite conservative measures, they continue to have pain.  X-rays show that the patient has bone on bone arthritis of the right knee.  It is felt that they would benefit from undergoing surgical intervention.  Risks and benefits have been discussed with the patient and they elect to proceed with surgery.  The patient has no contraindications to the upcoming procedure such as ongoing infection or progressive neurological disease.  Allergies: Allergies  Allergen Reactions  . Penicillins Other (See Comments)    Reaction=bleeding,diarrhea     Medications: Diovan 160 mg daily Fluticasone Propionate one spray daily 1% Andorgel daily Omeprazole 20.6mg  daily Vitamins Flax Oil Omega 3 fish oil DHA  Magnesium Baby Aspirin Vitamin C  Past Medical History: Past Medical History  Diagnosis Date  . HYPERLIPIDEMIA 03/28/2010  . OBESITY, MODERATE 04/25/2009  . ERECTILE DYSFUNCTION 04/25/2009  . Acute serous otitis media 02/22/2010  . HYPERTENSION 03/24/2009  . GERD 03/24/2009  . PONV (postoperative nausea and vomiting)   . Arthritis      Past Surgical History: Past Surgical History  Procedure Date  . Nasal septum surgery 1974  . Tonsillectomy     3 yrs. old     Family History: Family History  Problem Relation Age of Onset  . Scleroderma Mother   . Dementia Father     Social History: History  Substance Use Topics  . Smoking status: Never Smoker   . Smokeless tobacco: Never Used  . Alcohol Use: No     Review of Systems A comprehensive review of systems was negative.  Physical Exam:  Vitals: Pulse:76 Respirations:10 Blood  Pressure:163/87  General appearance: alert and mild distress Head: Normocephalic, without obvious abnormality, atraumatic Lungs: clear to auscultation bilaterally Heart: regular rate and rhythm, S1, S2 normal, no murmur, click, rub or gallop Abdomen: soft, non-tender; bowel sounds normal; no masses,  no organomegaly Extremities: mild effusion right knee, TTP MJL, -3 to 90 Skin: Skin color, texture, turgor normal. No rashes or lesions  Assessment/Plan: End stage arthritis, right knee  The patient is being admitted to [facility] to undergo a right total knee arthroplasty.  Surgery will be performed by Dr. Jene Every.  Risks and benefits have been discussed with the patient and they elect to proceed wth the procedure.  Patient plans to go home after surgery.

## 2012-06-11 ENCOUNTER — Encounter (HOSPITAL_COMMUNITY): Payer: Self-pay | Admitting: Anesthesiology

## 2012-06-11 ENCOUNTER — Inpatient Hospital Stay (HOSPITAL_COMMUNITY)
Admission: RE | Admit: 2012-06-11 | Discharge: 2012-06-14 | DRG: 470 | Disposition: A | Discharge status: Home Health | Payer: Medicare Other | Source: Ambulatory Visit | Attending: Specialist | Admitting: Specialist

## 2012-06-11 ENCOUNTER — Ambulatory Visit (HOSPITAL_COMMUNITY): Payer: Medicare Other | Admitting: Anesthesiology

## 2012-06-11 ENCOUNTER — Encounter (HOSPITAL_COMMUNITY): Payer: Self-pay | Admitting: *Deleted

## 2012-06-11 ENCOUNTER — Encounter (HOSPITAL_COMMUNITY)
Admission: RE | Disposition: A | Discharge status: Home Health | Payer: Self-pay | Source: Ambulatory Visit | Attending: Specialist

## 2012-06-11 DIAGNOSIS — M1710 Unilateral primary osteoarthritis, unspecified knee: Secondary | ICD-10-CM

## 2012-06-11 DIAGNOSIS — I10 Essential (primary) hypertension: Secondary | ICD-10-CM | POA: Diagnosis present

## 2012-06-11 DIAGNOSIS — E669 Obesity, unspecified: Secondary | ICD-10-CM | POA: Diagnosis present

## 2012-06-11 HISTORY — PX: TOTAL KNEE ARTHROPLASTY: SHX125

## 2012-06-11 LAB — TYPE AND SCREEN: Antibody Screen: NEGATIVE

## 2012-06-11 SURGERY — ARTHROPLASTY, KNEE, TOTAL
Anesthesia: General | Site: Knee | Laterality: Right | Wound class: Clean

## 2012-06-11 MED ORDER — IRBESARTAN 150 MG PO TABS
150.0000 mg | ORAL_TABLET | Freq: Every day | ORAL | Status: DC
  Administered 2012-06-11 – 2012-06-13 (×3): 150 mg via ORAL
  Filled 2012-06-11 (×4): qty 1

## 2012-06-11 MED ORDER — VANCOMYCIN HCL IN DEXTROSE 1-5 GM/200ML-% IV SOLN
1000.0000 mg | Freq: Two times a day (BID) | INTRAVENOUS | Status: AC
  Administered 2012-06-12 (×3): 1000 mg via INTRAVENOUS
  Filled 2012-06-11 (×3): qty 200

## 2012-06-11 MED ORDER — SODIUM CHLORIDE 0.9 % IR SOLN
Status: DC | PRN
  Administered 2012-06-11: 08:00:00

## 2012-06-11 MED ORDER — MENTHOL 3 MG MT LOZG: 1.0000 | LOZENGE | OROMUCOSAL | Status: DC | PRN

## 2012-06-11 MED ORDER — PROMETHAZINE HCL 25 MG/ML IJ SOLN
6.2500 mg | INTRAMUSCULAR | Status: DC | PRN
  Administered 2012-06-11: 12.5 mg via INTRAVENOUS

## 2012-06-11 MED ORDER — 0.9 % SODIUM CHLORIDE (POUR BTL) OPTIME
TOPICAL | Status: DC | PRN
  Administered 2012-06-11: 1000 mL

## 2012-06-11 MED ORDER — SUFENTANIL CITRATE 50 MCG/ML IV SOLN
INTRAVENOUS | Status: DC | PRN
  Administered 2012-06-11: 10 ug via INTRAVENOUS
  Administered 2012-06-11 (×2): 5 ug via INTRAVENOUS
  Administered 2012-06-11: 10 ug via INTRAVENOUS
  Administered 2012-06-11: 20 ug via INTRAVENOUS

## 2012-06-11 MED ORDER — METHOCARBAMOL 100 MG/ML IJ SOLN
500.0000 mg | Freq: Four times a day (QID) | INTRAVENOUS | Status: DC | PRN
  Administered 2012-06-11: 500 mg via INTRAVENOUS
  Filled 2012-06-11 (×2): qty 5

## 2012-06-11 MED ORDER — SODIUM CHLORIDE 0.45 % IV SOLN
INTRAVENOUS | Status: DC
  Administered 2012-06-11 – 2012-06-12 (×2): via INTRAVENOUS

## 2012-06-11 MED ORDER — ONDANSETRON HCL 4 MG/2ML IJ SOLN
INTRAMUSCULAR | Status: DC | PRN
  Administered 2012-06-11: 4 mg via INTRAVENOUS

## 2012-06-11 MED ORDER — ACETAMINOPHEN 650 MG RE SUPP: 650.0000 mg | Freq: Four times a day (QID) | RECTAL | Status: DC | PRN

## 2012-06-11 MED ORDER — BUPIVACAINE-EPINEPHRINE (PF) 0.5% -1:200000 IJ SOLN
INTRAMUSCULAR | Status: AC
Start: 2012-06-11 — End: 2012-06-11
  Filled 2012-06-11: qty 10

## 2012-06-11 MED ORDER — ACETAMINOPHEN 325 MG PO TABS: 650.0000 mg | ORAL_TABLET | Freq: Four times a day (QID) | ORAL | Status: DC | PRN

## 2012-06-11 MED ORDER — ACETAMINOPHEN 10 MG/ML IV SOLN
INTRAVENOUS | Status: AC
  Filled 2012-06-11: qty 100

## 2012-06-11 MED ORDER — PROPOFOL 10 MG/ML IV EMUL
INTRAVENOUS | Status: DC | PRN
  Administered 2012-06-11: 150 mg via INTRAVENOUS

## 2012-06-11 MED ORDER — HYDROMORPHONE HCL PF 1 MG/ML IJ SOLN
1.0000 mg | INTRAMUSCULAR | Status: DC | PRN
  Administered 2012-06-11 (×2): 1 mg via INTRAVENOUS
  Administered 2012-06-11: 0.5 mg via INTRAVENOUS
  Administered 2012-06-12: 1 mg via INTRAVENOUS
  Filled 2012-06-11 (×4): qty 1

## 2012-06-11 MED ORDER — ROCURONIUM BROMIDE 100 MG/10ML IV SOLN
INTRAVENOUS | Status: DC | PRN
  Administered 2012-06-11: 40 mg via INTRAVENOUS

## 2012-06-11 MED ORDER — RIVAROXABAN 10 MG PO TABS
10.0000 mg | ORAL_TABLET | Freq: Every day | ORAL | Status: DC
  Administered 2012-06-12 – 2012-06-14 (×3): 10 mg via ORAL
  Filled 2012-06-11 (×4): qty 1

## 2012-06-11 MED ORDER — ACETAMINOPHEN 10 MG/ML IV SOLN
INTRAVENOUS | Status: DC | PRN
  Administered 2012-06-11: 1000 mg via INTRAVENOUS

## 2012-06-11 MED ORDER — LACTATED RINGERS IV SOLN
INTRAVENOUS | Status: DC
  Administered 2012-06-11 (×3): via INTRAVENOUS

## 2012-06-11 MED ORDER — VALSARTAN-HYDROCHLOROTHIAZIDE 160-12.5 MG PO TABS: 1.0000 | ORAL_TABLET | Freq: Every day | ORAL | Status: DC

## 2012-06-11 MED ORDER — OXYCODONE HCL 5 MG PO TABS
5.0000 mg | ORAL_TABLET | ORAL | Status: DC | PRN
  Administered 2012-06-12: 5 mg via ORAL
  Administered 2012-06-12: 10 mg via ORAL
  Administered 2012-06-12: 5 mg via ORAL
  Administered 2012-06-12 – 2012-06-13 (×5): 10 mg via ORAL
  Filled 2012-06-11: qty 1
  Filled 2012-06-11 (×2): qty 2
  Filled 2012-06-11: qty 1
  Filled 2012-06-11 (×4): qty 2

## 2012-06-11 MED ORDER — SODIUM CHLORIDE 0.9 % IR SOLN
Status: DC | PRN
  Administered 2012-06-11: 3000 mL

## 2012-06-11 MED ORDER — METOCLOPRAMIDE HCL 10 MG PO TABS
5.0000 mg | ORAL_TABLET | Freq: Three times a day (TID) | ORAL | Status: DC | PRN
  Administered 2012-06-12: 5 mg via ORAL
  Filled 2012-06-11: qty 1

## 2012-06-11 MED ORDER — BUPIVACAINE-EPINEPHRINE 0.5% -1:200000 IJ SOLN
INTRAMUSCULAR | Status: DC | PRN
  Administered 2012-06-11: 25 mL

## 2012-06-11 MED ORDER — PANTOPRAZOLE SODIUM 40 MG PO TBEC
40.0000 mg | DELAYED_RELEASE_TABLET | Freq: Every day | ORAL | Status: DC
  Administered 2012-06-12 – 2012-06-13 (×2): 40 mg via ORAL
  Filled 2012-06-11 (×4): qty 1

## 2012-06-11 MED ORDER — HYDROCHLOROTHIAZIDE 12.5 MG PO CAPS
12.5000 mg | ORAL_CAPSULE | Freq: Every day | ORAL | Status: DC
  Administered 2012-06-11 – 2012-06-13 (×3): 12.5 mg via ORAL
  Filled 2012-06-11 (×4): qty 1

## 2012-06-11 MED ORDER — METOCLOPRAMIDE HCL 5 MG/ML IJ SOLN: 5.0000 mg | Freq: Three times a day (TID) | INTRAMUSCULAR | Status: DC | PRN

## 2012-06-11 MED ORDER — CIPROFLOXACIN IN D5W 400 MG/200ML IV SOLN
400.0000 mg | Freq: Two times a day (BID) | INTRAVENOUS | Status: AC
  Administered 2012-06-11 – 2012-06-12 (×2): 400 mg via INTRAVENOUS
  Filled 2012-06-11 (×2): qty 200

## 2012-06-11 MED ORDER — MIDAZOLAM HCL 5 MG/5ML IJ SOLN
INTRAMUSCULAR | Status: DC | PRN
  Administered 2012-06-11: 2 mg via INTRAVENOUS

## 2012-06-11 MED ORDER — CIPROFLOXACIN IN D5W 400 MG/200ML IV SOLN
400.0000 mg | INTRAVENOUS | Status: AC
  Administered 2012-06-11: 400 mg via INTRAVENOUS

## 2012-06-11 MED ORDER — PROMETHAZINE HCL 25 MG/ML IJ SOLN
INTRAMUSCULAR | Status: AC
  Filled 2012-06-11: qty 1

## 2012-06-11 MED ORDER — CIPROFLOXACIN IN D5W 400 MG/200ML IV SOLN
INTRAVENOUS | Status: AC
Start: 2012-06-11 — End: 2012-06-11
  Filled 2012-06-11: qty 200

## 2012-06-11 MED ORDER — PHENOL 1.4 % MT LIQD: 1.0000 | OROMUCOSAL | Status: DC | PRN

## 2012-06-11 MED ORDER — ONDANSETRON HCL 4 MG PO TABS
4.0000 mg | ORAL_TABLET | Freq: Four times a day (QID) | ORAL | Status: DC | PRN
  Administered 2012-06-12 (×2): 4 mg via ORAL
  Filled 2012-06-11 (×2): qty 1

## 2012-06-11 MED ORDER — ONDANSETRON HCL 4 MG/2ML IJ SOLN
4.0000 mg | Freq: Four times a day (QID) | INTRAMUSCULAR | Status: DC | PRN
  Administered 2012-06-13: 4 mg via INTRAVENOUS
  Filled 2012-06-11: qty 2

## 2012-06-11 MED ORDER — DEXAMETHASONE SODIUM PHOSPHATE 4 MG/ML IJ SOLN
INTRAMUSCULAR | Status: DC | PRN
  Administered 2012-06-11: 10 mg via INTRAVENOUS

## 2012-06-11 MED ORDER — VANCOMYCIN HCL 1000 MG IV SOLR
1500.0000 mg | INTRAVENOUS | Status: AC
  Administered 2012-06-11: 1500 mg via INTRAVENOUS
  Filled 2012-06-11: qty 1500

## 2012-06-11 MED ORDER — METHOCARBAMOL 500 MG PO TABS
500.0000 mg | ORAL_TABLET | Freq: Four times a day (QID) | ORAL | Status: DC | PRN
  Administered 2012-06-12 (×3): 500 mg via ORAL
  Filled 2012-06-11 (×3): qty 1

## 2012-06-11 MED ORDER — DOCUSATE SODIUM 100 MG PO CAPS
100.0000 mg | ORAL_CAPSULE | Freq: Two times a day (BID) | ORAL | Status: DC
  Administered 2012-06-11 – 2012-06-14 (×6): 100 mg via ORAL

## 2012-06-11 MED ORDER — HYDROMORPHONE HCL PF 1 MG/ML IJ SOLN
INTRAMUSCULAR | Status: AC
  Filled 2012-06-11: qty 1

## 2012-06-11 MED ORDER — LIDOCAINE HCL (CARDIAC) 20 MG/ML IV SOLN
INTRAVENOUS | Status: DC | PRN
  Administered 2012-06-11: 100 mg via INTRAVENOUS

## 2012-06-11 MED ORDER — FLUTICASONE PROPIONATE 50 MCG/ACT NA SUSP
1.0000 | Freq: Every day | NASAL | Status: DC
  Administered 2012-06-12 – 2012-06-13 (×2): 1 via NASAL
  Filled 2012-06-11: qty 16

## 2012-06-11 MED ORDER — HYDROMORPHONE HCL PF 1 MG/ML IJ SOLN
0.2500 mg | INTRAMUSCULAR | Status: DC | PRN
Start: 2012-06-11 — End: 2012-06-11
  Administered 2012-06-11 (×2): 0.5 mg via INTRAVENOUS

## 2012-06-11 MED ORDER — MECLIZINE HCL 25 MG PO TABS
25.0000 mg | ORAL_TABLET | Freq: Three times a day (TID) | ORAL | Status: DC | PRN
  Filled 2012-06-11: qty 1

## 2012-06-11 MED ORDER — DROPERIDOL 2.5 MG/ML IJ SOLN
INTRAMUSCULAR | Status: DC | PRN
  Administered 2012-06-11: .625 mg via INTRAVENOUS

## 2012-06-11 SURGICAL SUPPLY — 65 items
BAG SPEC THK2 15X12 ZIP CLS (MISCELLANEOUS) ×1
BAG ZIPLOCK 12X15 (MISCELLANEOUS) ×2 IMPLANT
BANDAGE ELASTIC 4 VELCRO ST LF (GAUZE/BANDAGES/DRESSINGS) ×2 IMPLANT
BANDAGE ELASTIC 6 VELCRO ST LF (GAUZE/BANDAGES/DRESSINGS) ×2 IMPLANT
BANDAGE ESMARK 6X9 LF (GAUZE/BANDAGES/DRESSINGS) ×1 IMPLANT
BLADE SAG 18X100X1.27 (BLADE) ×2 IMPLANT
BLADE SAW SGTL 13.0X1.19X90.0M (BLADE) ×2 IMPLANT
BNDG CMPR 9X6 STRL LF SNTH (GAUZE/BANDAGES/DRESSINGS) ×1
BNDG ESMARK 6X9 LF (GAUZE/BANDAGES/DRESSINGS) ×2
CEMENT HV SMART SET (Cement) ×4 IMPLANT
CHLORAPREP W/TINT 26ML (MISCELLANEOUS) IMPLANT
CLOTH BEACON ORANGE TIMEOUT ST (SAFETY) ×2 IMPLANT
CUFF TOURN SGL QUICK 34 (TOURNIQUET CUFF) ×2
CUFF TRNQT CYL 34X4X40X1 (TOURNIQUET CUFF) ×1 IMPLANT
DECANTER SPIKE VIAL GLASS SM (MISCELLANEOUS) ×2 IMPLANT
DRAPE INCISE IOBAN 66X45 STRL (DRAPES) ×1 IMPLANT
DRAPE LG THREE QUARTER DISP (DRAPES) ×2 IMPLANT
DRAPE ORTHO SPLIT 77X108 STRL (DRAPES) ×4
DRAPE POUCH INSTRU U-SHP 10X18 (DRAPES) ×2 IMPLANT
DRAPE SURG ORHT 6 SPLT 77X108 (DRAPES) ×2 IMPLANT
DRAPE U-SHAPE 47X51 STRL (DRAPES) ×2 IMPLANT
DRSG ADAPTIC 3X8 NADH LF (GAUZE/BANDAGES/DRESSINGS) ×2 IMPLANT
DRSG PAD ABDOMINAL 8X10 ST (GAUZE/BANDAGES/DRESSINGS) ×2 IMPLANT
DURAPREP 26ML APPLICATOR (WOUND CARE) ×2 IMPLANT
ELECT REM PT RETURN 9FT ADLT (ELECTROSURGICAL) ×2
ELECTRODE REM PT RTRN 9FT ADLT (ELECTROSURGICAL) ×1 IMPLANT
EVACUATOR 1/8 PVC DRAIN (DRAIN) ×2 IMPLANT
FACESHIELD LNG OPTICON STERILE (SAFETY) ×10 IMPLANT
GLOVE BIO SURGEON STRL SZ 6.5 (GLOVE) ×2 IMPLANT
GLOVE BIOGEL PI IND STRL 8 (GLOVE) ×1 IMPLANT
GLOVE BIOGEL PI INDICATOR 8 (GLOVE) ×1
GLOVE ECLIPSE 6.5 STRL STRAW (GLOVE) ×2 IMPLANT
GLOVE INDICATOR 6.5 STRL GRN (GLOVE) ×2 IMPLANT
GLOVE SURG SS PI 8.0 STRL IVOR (GLOVE) ×4 IMPLANT
GOWN PREVENTION PLUS LG XLONG (DISPOSABLE) ×2 IMPLANT
GOWN PREVENTION PLUS XLARGE (GOWN DISPOSABLE) ×3 IMPLANT
GOWN STRL REIN XL XLG (GOWN DISPOSABLE) ×3 IMPLANT
HANDPIECE INTERPULSE COAX TIP (DISPOSABLE) ×2
IMMOBILIZER KNEE 20 (SOFTGOODS) ×2
IMMOBILIZER KNEE 20 THIGH 36 (SOFTGOODS) ×1 IMPLANT
KIT BASIN OR (CUSTOM PROCEDURE TRAY) ×2 IMPLANT
MANIFOLD NEPTUNE II (INSTRUMENTS) ×2 IMPLANT
NS IRRIG 1000ML POUR BTL (IV SOLUTION) ×2 IMPLANT
PACK TOTAL JOINT (CUSTOM PROCEDURE TRAY) ×2 IMPLANT
PADDING CAST COTTON 6X4 STRL (CAST SUPPLIES) ×2 IMPLANT
POSITIONER SURGICAL ARM (MISCELLANEOUS) ×2 IMPLANT
SET HNDPC FAN SPRY TIP SCT (DISPOSABLE) ×1 IMPLANT
SPONGE GAUZE 4X4 12PLY (GAUZE/BANDAGES/DRESSINGS) ×1 IMPLANT
SPONGE SURGIFOAM ABS GEL 100 (HEMOSTASIS) ×2 IMPLANT
STAPLER VISISTAT (STAPLE) ×2 IMPLANT
STRIP CLOSURE SKIN 1/2X4 (GAUZE/BANDAGES/DRESSINGS) ×1 IMPLANT
SUCTION FRAZIER 12FR DISP (SUCTIONS) ×2 IMPLANT
SUT BONE WAX W31G (SUTURE) ×2 IMPLANT
SUT MNCRL AB 4-0 PS2 18 (SUTURE) ×1 IMPLANT
SUT VIC AB 1 CT1 27 (SUTURE) ×8
SUT VIC AB 1 CT1 27XBRD ANTBC (SUTURE) ×4 IMPLANT
SUT VIC AB 2-0 CT1 27 (SUTURE) ×6
SUT VIC AB 2-0 CT1 TAPERPNT 27 (SUTURE) ×3 IMPLANT
SUT VLOC 180 0 24IN GS25 (SUTURE) ×2 IMPLANT
SYR 30ML LL (SYRINGE) ×2 IMPLANT
SYR BULB IRRIGATION 50ML (SYRINGE) ×1 IMPLANT
TOWER CARTRIDGE SMART MIX (DISPOSABLE) ×2 IMPLANT
TRAY FOLEY CATH 14FRSI W/METER (CATHETERS) ×2 IMPLANT
WATER STERILE IRR 1500ML POUR (IV SOLUTION) ×2 IMPLANT
WRAP KNEE MAXI GEL POST OP (GAUZE/BANDAGES/DRESSINGS) ×2 IMPLANT

## 2012-06-11 NOTE — Anesthesia Preprocedure Evaluation (Signed)
Anesthesia Evaluation  Patient identified by MRN, date of birth, ID band Patient awake    Reviewed: Allergy & Precautions, H&P , NPO status , Patient's Chart, lab work & pertinent test results, reviewed documented beta blocker date and time   Airway Mallampati: III TM Distance: >3 FB Neck ROM: Full    Dental  (+) Teeth Intact and Dental Advisory Given   Pulmonary neg pulmonary ROS,  breath sounds clear to auscultation        Cardiovascular hypertension, Pt. on medications Rhythm:Regular Rate:Normal  Denies cardiac symptoms   Neuro/Psych negative neurological ROS  negative psych ROS   GI/Hepatic negative GI ROS, Neg liver ROS,   Endo/Other  negative endocrine ROS  Renal/GU negative Renal ROS  negative genitourinary   Musculoskeletal   Abdominal   Peds negative pediatric ROS (+)  Hematology negative hematology ROS (+)   Anesthesia Other Findings   Reproductive/Obstetrics negative OB ROS                           Anesthesia Physical Anesthesia Plan  ASA: II  Anesthesia Plan: General   Post-op Pain Management:    Induction: Intravenous  Airway Management Planned: Oral ETT  Additional Equipment:   Intra-op Plan:   Post-operative Plan: Extubation in OR  Informed Consent: I have reviewed the patients History and Physical, chart, labs and discussed the procedure including the risks, benefits and alternatives for the proposed anesthesia with the patient or authorized representative who has indicated his/her understanding and acceptance.   Dental advisory given  Plan Discussed with: CRNA and Surgeon  Anesthesia Plan Comments:         Anesthesia Quick Evaluation

## 2012-06-11 NOTE — Progress Notes (Signed)
UR COMPLETED  

## 2012-06-11 NOTE — Interval H&P Note (Signed)
History and Physical Interval Note:  06/11/2012 6:41 AM  Clifford Gibson  has presented today for surgery, with the diagnosis of djd right knee   The various methods of treatment have been discussed with the patient and family. After consideration of risks, benefits and other options for treatment, the patient has consented to  Procedure(s) (LRB): TOTAL KNEE ARTHROPLASTY (Right) as a surgical intervention .  The patient's history has been reviewed, patient examined, no change in status, stable for surgery.  I have reviewed the patients' chart and labs.  Questions were answered to the patient's satisfaction.     Secret Kristensen C

## 2012-06-11 NOTE — Brief Op Note (Signed)
06/11/2012  9:41 AM  PATIENT:  Clifford Gibson  66 y.o. male  PRE-OPERATIVE DIAGNOSIS:  degenerative joint disease right knee   POST-OPERATIVE DIAGNOSIS:  degenerative joint disease right knee  PROCEDURE:  Procedure(s) (LRB): TOTAL KNEE ARTHROPLASTY (Right)  SURGEON:  Surgeon(s) and Role:    * Javier Docker, MD - Primary  PHYSICIAN ASSISTANT:   ASSISTANTS: Tech   ANESTHESIA:   general  EBL:  Total I/O In: 1000 [I.V.:1000] Out: 300 [Urine:200; Blood:100]  BLOOD ADMINISTERED:none  DRAINS: (1) Hemovact drain(s) in the open with  Suction Open   LOCAL MEDICATIONS USED:  MARCAINE     SPECIMEN:  No Specimen  DISPOSITION OF SPECIMEN:  N/A  COUNTS:  YES  TOURNIQUET:   Total Tourniquet Time Documented: Thigh (Right) - 91 minutes  DICTATION: .Other Dictation: Dictation Number [id number]  PLAN OF CARE: Admit to inpatient   PATIENT DISPOSITION:  PACU - hemodynamically stable.   Delay start of Pharmacological VTE agent (>24hrs) due to surgical blood loss or risk of bleeding: no

## 2012-06-11 NOTE — Anesthesia Postprocedure Evaluation (Signed)
  Anesthesia Post-op Note  Patient: Clifford Gibson  Procedure(s) Performed: Procedure(s) (LRB): TOTAL KNEE ARTHROPLASTY (Right)  Patient Location: PACU  Anesthesia Type: General  Level of Consciousness: oriented and sedated  Airway and Oxygen Therapy: Patient Spontanous Breathing and Patient connected to nasal cannula oxygen  Post-op Pain: mild  Post-op Assessment: Post-op Vital signs reviewed, Patient's Cardiovascular Status Stable, Respiratory Function Stable and Patent Airway  Post-op Vital Signs: stable  Complications: No apparent anesthesia complications 

## 2012-06-11 NOTE — Transfer of Care (Signed)
Immediate Anesthesia Transfer of Care Note  Patient: Clifford Gibson  Procedure(s) Performed: Procedure(s) (LRB): TOTAL KNEE ARTHROPLASTY (Right)  Patient Location: PACU  Anesthesia Type: General  Level of Consciousness: awake and sedated  Airway & Oxygen Therapy: Patient Spontanous Breathing and Patient connected to face mask oxygen  Post-op Assessment: Report given to PACU RN, Post -op Vital signs reviewed and stable and Patient moving all extremities  Post vital signs: Reviewed and stable  Complications: No apparent anesthesia complications

## 2012-06-11 NOTE — Anesthesia Postprocedure Evaluation (Signed)
  Anesthesia Post-op Note  Patient: Clifford Gibson  Procedure(s) Performed: Procedure(s) (LRB): TOTAL KNEE ARTHROPLASTY (Right)  Patient Location: PACU  Anesthesia Type: General  Level of Consciousness: oriented and sedated  Airway and Oxygen Therapy: Patient Spontanous Breathing and Patient connected to nasal cannula oxygen  Post-op Pain: mild  Post-op Assessment: Post-op Vital signs reviewed, Patient's Cardiovascular Status Stable, Respiratory Function Stable and Patent Airway  Post-op Vital Signs: stable  Complications: No apparent anesthesia complications

## 2012-06-12 ENCOUNTER — Inpatient Hospital Stay (HOSPITAL_COMMUNITY): Payer: Medicare Other

## 2012-06-12 ENCOUNTER — Encounter (HOSPITAL_COMMUNITY): Payer: Self-pay | Admitting: Specialist

## 2012-06-12 LAB — BASIC METABOLIC PANEL
BUN: 15 mg/dL (ref 6–23)
Calcium: 9 mg/dL (ref 8.4–10.5)
GFR calc non Af Amer: 84 mL/min — ABNORMAL LOW (ref >90)
Glucose, Bld: 173 mg/dL — ABNORMAL HIGH (ref 70–99)
Sodium: 135 mEq/L (ref 135–145)

## 2012-06-12 LAB — CBC
HCT: 38.5 % — ABNORMAL LOW (ref 39.0–52.0)
Hemoglobin: 13.1 g/dL (ref 13.0–17.0)
MCH: 28.5 pg (ref 26.0–34.0)
MCHC: 34 g/dL (ref 30.0–36.0)

## 2012-06-12 NOTE — Evaluation (Signed)
Physical Therapy Evaluation Patient Details Name: Clifford Gibson MRN: [id number] DOB: 06-29-1946 Today's Date: 06/12/2012 Time: 4098-1191 PT Time Calculation (min): 42 min  PT Assessment / Plan / Recommendation Clinical Impression  Pt with R TKR presents with decreased R LE strength/ ROM and limitations to functional mobility    PT Assessment  Patient needs continued PT services    Follow Up Recommendations  Home health PT    Barriers to Discharge        Equipment Recommendations  Rolling walker with 5" wheels    Recommendations for Other Services     Frequency 7X/week    Precautions / Restrictions Precautions Precautions: Knee Required Braces or Orthoses: Knee Immobilizer - Right Knee Immobilizer - Right: Discontinue once straight leg raise with < 10 degree lag Restrictions Weight Bearing Restrictions: No Other Position/Activity Restrictions: WBAT   Pertinent Vitals/Pain 5/10, pt premedicated, cold packs provided      Mobility  Bed Mobility Bed Mobility: Supine to Sit Supine to Sit: 4: Min assist Details for Bed Mobility Assistance: cues for sequence and use of R LE to self assist Transfers Transfers: Sit to Stand;Stand to Sit Sit to Stand: 4: Min assist Stand to Sit: 4: Min assist Details for Transfer Assistance: cues for LE management and use of UEs to self assist Ambulation/Gait Ambulation/Gait Assistance: 4: Min assist Ambulation Distance (Feet): 70 Feet Assistive device: Rolling walker Ambulation/Gait Assistance Details: cues for sequence, stride length, posture and position from RW Gait Pattern: Step-to pattern    Exercises Total Joint Exercises Ankle Circles/Pumps: AROM;15 reps;Both;Supine Quad Sets: AROM;Both;10 reps;Supine Heel Slides: AAROM;10 reps;Supine;Right Straight Leg Raises: AAROM;15 reps;Supine;Right   PT Diagnosis: Difficulty walking  PT Problem List: Decreased range of motion;Decreased strength;Decreased activity tolerance;Decreased  mobility;Decreased knowledge of use of DME;Pain PT Treatment Interventions: DME instruction;Gait training;Stair training;Functional mobility training;Therapeutic activities;Therapeutic exercise;Patient/family education   PT Goals Acute Rehab PT Goals PT Goal Formulation: With patient Time For Goal Achievement: 06/17/12 Potential to Achieve Goals: Good Pt will go Supine/Side to Sit: with supervision PT Goal: Supine/Side to Sit - Progress: Goal set today Pt will go Sit to Supine/Side: with supervision PT Goal: Sit to Supine/Side - Progress: Goal set today Pt will go Sit to Stand: with supervision PT Goal: Sit to Stand - Progress: Goal set today Pt will go Stand to Sit: with supervision PT Goal: Stand to Sit - Progress: Goal set today Pt will Ambulate: >150 feet;with supervision;with rolling walker PT Goal: Ambulate - Progress: Goal set today Pt will Go Up / Down Stairs: 1-2 stairs;with min assist;with least restrictive assistive device PT Goal: Up/Down Stairs - Progress: Goal set today  Visit Information  Last PT Received On: 06/12/12 Assistance Needed: +1    Subjective Data  Subjective: It's not as hard to walk as I expected Patient Stated Goal: Resume previous lifestyle with decreased pain   Prior Functioning  Home Living Lives With: Spouse Available Help at Discharge: Family Type of Home: House Home Access: Stairs to enter Secretary/administrator of Steps: 1 Home Layout: One level Prior Function Level of Independence: Independent Able to Take Stairs?: Yes Driving: Yes Communication Communication: No difficulties    Cognition  Overall Cognitive Status: Appears within functional limits for tasks assessed/performed Arousal/Alertness: Awake/alert Orientation Level: Appears intact for tasks assessed Behavior During Session: [facility] for tasks performed    Extremity/Trunk Assessment Right Upper Extremity Assessment RUE ROM/Strength/Tone: [facility] for tasks assessed Left Upper  Extremity Assessment LUE ROM/Strength/Tone: [facility] for tasks assessed Right Lower Extremity Assessment  RLE ROM/Strength/Tone: Deficits RLE ROM/Strength/Tone Deficits: AAROM at knee -10 - 70 with 3-/5 quads Left Lower Extremity Assessment LLE ROM/Strength/Tone: WFL for tasks assessed   Balance    End of Session PT - End of Session Equipment Utilized During Treatment: Right knee immobilizer Activity Tolerance: Patient tolerated treatment well Patient left: in chair;with call bell/phone within reach;with family/visitor present Nurse Communication: Mobility status  GP     Clifford Gibson 06/12/2012, 2:12 PM

## 2012-06-12 NOTE — Progress Notes (Signed)
Physical Therapy Treatment Patient Details Name: Clifford Gibson MRN: [id number] DOB: 1946-09-24 Today's Date: 06/12/2012 Time: 2595-6387 PT Time Calculation (min): 25 min  PT Assessment / Plan / Recommendation Comments on Treatment Session       Follow Up Recommendations  Home health PT    Barriers to Discharge        Equipment Recommendations  Rolling walker with 5" wheels    Recommendations for Other Services    Frequency 7X/week   Plan Discharge plan remains appropriate    Precautions / Restrictions Precautions Precautions: Knee Required Braces or Orthoses: Knee Immobilizer - Right Knee Immobilizer - Right: Discontinue once straight leg raise with < 10 degree lag Restrictions Weight Bearing Restrictions: No Other Position/Activity Restrictions: WBAT   Pertinent Vitals/Pain 3/10, pt premedicated    Mobility  Bed Mobility Bed Mobility: Sit to Supine Supine to Sit: 4: Min assist Sit to Supine: 4: Min assist Details for Bed Mobility Assistance: cues for sequence and min assist for R LE Transfers Transfers: Sit to Stand;Stand to Sit Sit to Stand: 4: Min guard Stand to Sit: 4: Min assist Details for Transfer Assistance: cues for LE management and use of UEs to self assist Ambulation/Gait Ambulation/Gait Assistance: 4: Min Environmental consultant (Feet): 115 Feet Assistive device: Rolling walker Ambulation/Gait Assistance Details: cues for stride length, safe RW management, posture and position from RW Gait Pattern: Step-to pattern    Exercises Total Joint Exercises Ankle Circles/Pumps: AROM;15 reps;Both;Supine Quad Sets: AROM;Both;10 reps;Supine Heel Slides: AAROM;10 reps;Supine;Right Straight Leg Raises: AAROM;15 reps;Supine;Right   PT Diagnosis: Difficulty walking  PT Problem List: Decreased range of motion;Decreased strength;Decreased activity tolerance;Decreased mobility;Decreased knowledge of use of DME;Pain PT Treatment Interventions: DME  instruction;Gait training;Stair training;Functional mobility training;Therapeutic activities;Therapeutic exercise;Patient/family education   PT Goals Acute Rehab PT Goals PT Goal Formulation: With patient Time For Goal Achievement: 06/17/12 Potential to Achieve Goals: Good Pt will go Supine/Side to Sit: with supervision PT Goal: Supine/Side to Sit - Progress: Goal set today Pt will go Sit to Supine/Side: with supervision PT Goal: Sit to Supine/Side - Progress: Goal set today Pt will go Sit to Stand: with supervision PT Goal: Sit to Stand - Progress: Progressing toward goal Pt will go Stand to Sit: with supervision PT Goal: Stand to Sit - Progress: Progressing toward goal Pt will Ambulate: >150 feet;with supervision;with rolling walker PT Goal: Ambulate - Progress: Progressing toward goal Pt will Go Up / Down Stairs: 1-2 stairs;with min assist;with least restrictive assistive device PT Goal: Up/Down Stairs - Progress: Goal set today  Visit Information  Last PT Received On: 06/12/12 Assistance Needed: +1    Subjective Data  Subjective: I need to use the bathroom Patient Stated Goal: Resume previous lifestyle with decreased pain   Cognition  Overall Cognitive Status: Appears within functional limits for tasks assessed/performed Arousal/Alertness: Awake/alert Orientation Level: Appears intact for tasks assessed Behavior During Session: [facility] for tasks performed    Balance     End of Session PT - End of Session Equipment Utilized During Treatment: Right knee immobilizer Activity Tolerance: Patient tolerated treatment well Patient left: Other (comment);in bed Nurse Communication: Mobility status   GP     Yug Loria 06/12/2012, 2:19 PM

## 2012-06-12 NOTE — Progress Notes (Signed)
Physical Therapy Treatment Patient Details Name: Clifford Gibson MRN: [id number] DOB: 1946/07/15 Today's Date: 06/12/2012 Time: 4098-1191 PT Time Calculation (min): 12 min  PT Assessment / Plan / Recommendation Comments on Treatment Session       Follow Up Recommendations  Home health PT    Barriers to Discharge        Equipment Recommendations  Rolling walker with 5" wheels    Recommendations for Other Services    Frequency 7X/week   Plan Discharge plan remains appropriate    Precautions / Restrictions Precautions Precautions: Knee Required Braces or Orthoses: Knee Immobilizer - Right Knee Immobilizer - Right: Discontinue once straight leg raise with < 10 degree lag Restrictions Weight Bearing Restrictions: No Other Position/Activity Restrictions: WBAT   Pertinent Vitals/Pain 4-5/10    Mobility  Bed Mobility Bed Mobility: Supine to Sit Supine to Sit: 4: Min assist Details for Bed Mobility Assistance: cues for sequence and use of R LE to self assist Transfers Transfers: Sit to Stand;Stand to Sit Sit to Stand: 4: Min assist Stand to Sit: 4: Min assist Details for Transfer Assistance: cues for LE management and use of UEs to self assist Ambulation/Gait Ambulation/Gait Assistance: 4: Min Environmental consultant (Feet): 24 Feet Assistive device: Rolling walker Ambulation/Gait Assistance Details: cues for posture and position from RW Gait Pattern: Step-to pattern    Exercises Total Joint Exercises Ankle Circles/Pumps: AROM;15 reps;Both;Supine Quad Sets: AROM;Both;10 reps;Supine Heel Slides: AAROM;10 reps;Supine;Right Straight Leg Raises: AAROM;15 reps;Supine;Right   PT Diagnosis: Difficulty walking  PT Problem List: Decreased range of motion;Decreased strength;Decreased activity tolerance;Decreased mobility;Decreased knowledge of use of DME;Pain PT Treatment Interventions: DME instruction;Gait training;Stair training;Functional mobility training;Therapeutic  activities;Therapeutic exercise;Patient/family education   PT Goals Acute Rehab PT Goals PT Goal Formulation: With patient Time For Goal Achievement: 06/17/12 Potential to Achieve Goals: Good Pt will go Supine/Side to Sit: with supervision PT Goal: Supine/Side to Sit - Progress: Goal set today Pt will go Sit to Supine/Side: with supervision PT Goal: Sit to Supine/Side - Progress: Goal set today Pt will go Sit to Stand: with supervision PT Goal: Sit to Stand - Progress: Progressing toward goal Pt will go Stand to Sit: with supervision PT Goal: Stand to Sit - Progress: Progressing toward goal Pt will Ambulate: >150 feet;with supervision;with rolling walker PT Goal: Ambulate - Progress: Progressing toward goal Pt will Go Up / Down Stairs: 1-2 stairs;with min assist;with least restrictive assistive device PT Goal: Up/Down Stairs - Progress: Goal set today  Visit Information  Last PT Received On: 06/12/12 Assistance Needed: +1    Subjective Data  Subjective: I need to use the bathroom Patient Stated Goal: Resume previous lifestyle with decreased pain   Cognition  Overall Cognitive Status: Appears within functional limits for tasks assessed/performed Arousal/Alertness: Awake/alert Orientation Level: Appears intact for tasks assessed Behavior During Session: [facility] for tasks performed    Balance     End of Session PT - End of Session Equipment Utilized During Treatment: Right knee immobilizer Activity Tolerance: Patient tolerated treatment well Patient left: Other (comment) (in bathroom with call light) Nurse Communication: Mobility status   GP     Kirke Breach 06/12/2012, 2:16 PM

## 2012-06-12 NOTE — Progress Notes (Signed)
Subjective: 1 Day Post-Op Procedure(s) (LRB): TOTAL KNEE ARTHROPLASTY (Right) Patient reports pain as 5 on 0-10 scale.    Objective: Vital signs in last 24 hours: Temp:  [97.5 F (36.4 C)-98.6 F (37 C)] 98.6 F (37 C) (07/12 1610) Pulse Rate:  [84-96] 92  (07/12 0614) Resp:  [14-19] 14  (07/12 0614) BP: (124-168)/(71-88) 124/77 mmHg (07/12 0614) SpO2:  [96 %-100 %] 97 % (07/12 0614) Weight:  [103.874 kg (229 lb)] 103.874 kg (229 lb) (07/11 1130)  Intake/Output from previous day: 07/11 0701 - 07/12 0700 In: 4126.8 [P.O.:960; I.V.:2711.8; IV Piggyback:455] Out: 3455 [Urine:3285; Drains:70; Blood:100] Intake/Output this shift:     Basename 06/12/12 0432  HGB 13.1    Basename 06/12/12 0432  WBC 16.4*  RBC 4.60  HCT 38.5*  PLT 249    Basename 06/12/12 0432  NA 135  K 4.0  CL 99  CO2 25  BUN 15  CREATININE 0.95  GLUCOSE 173*  CALCIUM 9.0   No results found for this basename: LABPT:2,INR:2 in the last 72 hours  Neurologically intact Intact pulses distally No cellulitis present Compartment soft  Assessment/Plan: 1 Day Post-Op Procedure(s) (LRB): TOTAL KNEE ARTHROPLASTY (Right) Advance diet Up with therapy D/C IV fluids. D/C drain  Gisel Vipond C 06/12/2012, 7:58 AM

## 2012-06-12 NOTE — Op Note (Signed)
NAMEMarland Gibson  MORSE, BRUEGGEMANN NO.:  [id number]  MEDICAL RECORD NO.:  [id number]  LOCATION:  1616                         FACILITY:  [facility]  PHYSICIAN:  Jene Every, M.D.    DATE OF BIRTH:  1946-11-12  DATE OF PROCEDURE:  06/11/2012 DATE OF DISCHARGE:                              OPERATIVE REPORT   PREOPERATIVE DIAGNOSES:  Degenerative joint disease, end-stage osteoarthrosis of the right knee.  POSTOPERATIVE DIAGNOSES:  Degenerative joint disease, end-stage osteoarthrosis of the right knee.  PROCEDURE PERFORMED:  Right total knee arthroplasty.  ANESTHESIA:  General.  ASSISTANT:  Surgical tech.  COMPONENTS USED:  DePuy rotating platform, 3 femur, 3 tibia, 10 mm insert, 38 patella.  HISTORY:  A 66 year old with bone on bone osteoarthrosis exposing medial compartment, patellofemoral joint, refractory to conservative treatment indicated for replacement of the degenerated joint.  Risks and benefits discussed including bleeding, infection, damage to vascular structures, suboptimal range of motion, arthrofibrosis, DVT, PE, anesthetic complications, etc.  TECHNIQUE:  With the patient in supine position, after induction of adequate general anesthesia, 1 g of vancomycin and 400 ciprofloxacin due to the patient's history of a pen allergy and requesting broad-based antimicrobial prophylaxis.  The right lower extremity was prepped and draped in the usual sterile fashion and exsanguinated with thigh tourniquet inflated at 275 mmHg.  Midline incision made.  Full thickness flaps, median and parapatellar arthrotomy performed.  Soft tissues elevated medially.  The patella everted.  The flexed bone-on-bone osteoarthrosis of medial compartment, patellofemoral joint was noted relatively spared lateral compartment.  Removed the remnants of the medial and lateral menisci, cauterized the geniculate, and removed the ACL.  A step drill was utilized to enter the femoral canal,  irrigated, T- handle 5 degree right, 10 off the distal femur.  This was pinned, cut, sized off the distal femur.  It was between a 3 and 4 closer to a 3. After careful consideration, we selected a 3 basing off the anterior cortex.  This was then pinned as measurement __________ and then subsequent 3.  We used a saw to perform an anterior, posterior, and chamfer cuts.  Next, attention turned towards the tibia.  The knee was flexed.  The tibia subluxed, 10 off the high side which was laterally bisecting the ankle, medializing the distal guide.  This was then pinned parallel to the tibia.  An oscillating saw performed the tibial cut.  We then examined our flexion extension gaps and they were equivalent. Checked posteriorly, removed any menisci remnants, cauterized the geniculate.  Popliteus was spared.  Then completed the tibia, the tibia subluxed with McHale, they measured to a 3, maximizing the coverage on the cortical bone, removed osteophytes.  Centered just to the medial aspect of the tibial tubercle, pinned, essentially drilled, punch guide utilized, turned attention towards the femur.  Again AP was different than medial and lateral.  It was centered over the canal slightly lateral with the box cut which was then performed.  Trial femur placed, flushed laterally.  Ten insert, full extension, full flexion, good stability, varus valgus stressing at 0-30 degrees.  Good patellofemoral tracking __________ sized 23, selected the 38, planed to a 15 with a clamp utilizing oscillating  saw, drilled PEG holes, medializing them. __________ trial patella and we had normal patellofemoral tracking.  All trials were then removed.  We used pulsatile lavage on the bony surfaces, checked posteriorly.  __________, knee flexed, all surfaces dried, cement mixed in appropriate fashion, injected into the proximal tibia, digitally pressurized, impacted a 3 permanent tray, cemented the femur, impacted the 3  femur, trial 10 insert, full extension, axial load applied throughout the curing of the cement, cemented the patellar button.  After curing the cement, we trialed, 10 was optimal, full extension, full flexion, good stability, varus valgus stressing at 0-30 degrees.  Negative anterior drawer.  We removed the trial and put in the permanent insert, again checked posteriorly, removed any redundant cement.  Copiously irrigated prior to placing the final insert which was 10 again, full extension and flexion and tracking of the patella.  Next the wound was copiously irrigated.  Marcaine with epinephrine to anesthetize the periosteum.  The Hemovac placed and brought out through a stab wound in the skin.  We repaired the patellar arthrotomy with #1 Vicryl interrupted figure-of-eight approximation sutures and a running V- Loc, subcu with 2-0 Vicryl simple sutures and the skin was reapproximated with 4-0 subcuticular suture.  Steri-Strips applied. Tourniquet was deflated at 90 minutes.  Adequate revascularization of lower extremity appreciated.  He had 90 degrees of flexion to gravity after the dressing was applied.  I placed him in a knee immobilizer, extubated without difficulty, and transported to the recovery room in satisfactory condition.  The patient tolerated the procedure well.  No complications.  Minimal blood loss.     Jene Every, M.D.     Cordelia Pen  D:  06/11/2012  T:  06/12/2012  Job:  [id number]

## 2012-06-13 LAB — CBC
HCT: 37.6 % — ABNORMAL LOW (ref 39.0–52.0)
Hemoglobin: 12.5 g/dL — ABNORMAL LOW (ref 13.0–17.0)
MCHC: 33.2 g/dL (ref 30.0–36.0)
RBC: 4.39 MIL/uL (ref 4.22–5.81)

## 2012-06-13 NOTE — Evaluation (Signed)
Occupational Therapy Evaluation Patient Details Name: Clifford Gibson MRN: [id number] DOB: 11/05/1946 Today's Date: 06/13/2012 Time: 4098-1191 OT Time Calculation (min): 23 min  OT Assessment / Plan / Recommendation Clinical Impression  This 66 year old man is s/p R TKA.  Wife will assist him at home.  Will follow in acute care to complete education for bathroom transfers for safe discharge home.      OT Assessment  Patient needs continued OT Services    Follow Up Recommendations  No OT follow up    Barriers to Discharge      Equipment Recommendations  Rolling walker with 5" wheels    Recommendations for Other Services    Frequency  Min 2X/week    Precautions / Restrictions Precautions Precautions: Knee Required Braces or Orthoses: Knee Immobilizer - Right Knee Immobilizer - Right: Discontinue once straight leg raise with < 10 degree lag   Pertinent Vitals/Pain R knee min pain with weightbearing; had nausea and RN gave him medicine for this.      ADL  Eating/Feeding: Simulated;Independent Where Assessed - Eating/Feeding: Edge of bed Grooming: Simulated;Set up Where Assessed - Grooming: Unsupported sitting Upper Body Bathing: Simulated;Set up Where Assessed - Upper Body Bathing: Unsupported sitting Lower Body Bathing: Simulated;Minimal assistance Where Assessed - Lower Body Bathing: Supported sit to stand Upper Body Dressing: Simulated;Set up Where Assessed - Upper Body Dressing: Unsupported sitting Lower Body Dressing: Simulated;Moderate assistance Where Assessed - Lower Body Dressing: Supported sit to stand Toilet Transfer: Mining engineer Method:  (ambulate) Acupuncturist:  (walked around bed to recliner) Toileting - Clothing Manipulation and Hygiene: Simulated;Min guard Where Assessed - Engineer, mining and Hygiene: Standing Tub/Shower Transfer:  (reviewed sequence) Equipment Used: Rolling  walker Transfers/Ambulation Related to ADLs: min guard walking in room ADL Comments: pt/wife had questions about recliner and shower.  They would like to practice shower later when pt feels better.  His nausea was resolving when I left.  Educated on Sports administrator uses for ADLs    OT Diagnosis: Generalized weakness  OT Problem List: Decreased activity tolerance;Decreased knowledge of use of DME or AE;Pain;Decreased strength OT Treatment Interventions: Self-care/ADL training;DME and/or AE instruction;Patient/family education   OT Goals Acute Rehab OT Goals OT Goal Formulation: With patient/family Time For Goal Achievement: 06/20/12 Potential to Achieve Goals: Good ADL Goals Pt Will Transfer to Toilet: Ambulation;with supervision;3-in-1 ADL Goal: Toilet Transfer - Progress: Goal set today Pt Will Perform Tub/Shower Transfer: Shower transfer;Ambulation;with supervision ADL Goal: Tub/Shower Transfer - Progress: Goal set today  Visit Information  Last OT Received On: 06/13/12 Assistance Needed: +1    Subjective Data  Subjective: "I feel nauseaus.  I'm waiting for someone to help me get to the chair" Patient Stated Goal: agreeable to OT.  Wants to practice shower transfer later   Prior Functioning  Vision/Perception  Home Living Lives With: Spouse Type of Home: House Home Layout: One level Bathroom Shower/Tub: Health visitor: Standard Additional Comments: has reacher Prior Function Level of Independence: Independent Able to Take Stairs?: Yes Driving: Yes Communication Communication: No difficulties      Cognition  Overall Cognitive Status: Appears within functional limits for tasks assessed/performed Behavior During Session: [facility] for tasks performed    Extremity/Trunk Assessment Right Upper Extremity Assessment RUE ROM/Strength/Tone: [facility] for tasks assessed Left Upper Extremity Assessment LUE ROM/Strength/Tone: WFL for tasks assessed   Mobility Transfers Sit to  Stand: 4: Min guard   Exercise    Balance    End of  Session OT - End of Session Activity Tolerance: Other (comment) (nausea) Patient left: in chair;with call bell/phone within reach;with family/visitor present  GO     Meliton Samad 06/13/2012, 8:33 AM Marica Otter, OTR/L [phone] 06/13/2012

## 2012-06-13 NOTE — Progress Notes (Signed)
Cm spoke with patient concerning dc planning with spouse at the bedside. Per pt choice [facility] to provide [facility] services upon discharge. Spouse to assist in home care. Pt has RW present at bedside. Pt request 3N1 to assist with weight bearing as tolerated on right lower ext. No other needs stated. [facility] notified of DME referral. Genevieve Norlander notified of potential dc 06/14/12.   Leonie Green [phone]

## 2012-06-13 NOTE — Progress Notes (Signed)
Physical Therapy Treatment Patient Details Name: AC COLAN MRN: [id number] DOB: 1946-04-21 Today's Date: 06/13/2012 Time: 4098-1191 PT Time Calculation (min): 13 min  PT Assessment / Plan / Recommendation Comments on Treatment Session  Pt feeling nauseated this morning.  Pt ambulated in hallway min/guard with minimal cues.  Pt and spouse verbally educated on 1 step technique as pt reports he felt he didn't need to practice just 1 step.    Follow Up Recommendations  Home health PT    Barriers to Discharge        Equipment Recommendations  Rolling walker with 5" wheels    Recommendations for Other Services    Frequency     Plan Discharge plan remains appropriate    Precautions / Restrictions Precautions Precautions: Knee Required Braces or Orthoses: Knee Immobilizer - Right Knee Immobilizer - Right: Discontinue once straight leg raise with < 10 degree lag Restrictions Other Position/Activity Restrictions: WBAT   Pertinent Vitals/Pain No pain    Mobility  Transfers Transfers: Sit to Stand;Stand to Sit Sit to Stand: 4: Min guard;With upper extremity assist;With armrests;From chair/3-in-1 Stand to Sit: 4: Min guard;With upper extremity assist;With armrests;To chair/3-in-1 Details for Transfer Assistance: verbal cues for R LE forward and hand placement Ambulation/Gait Ambulation/Gait Assistance: 4: Min guard Ambulation Distance (Feet): 80 Feet Assistive device: Rolling walker Ambulation/Gait Assistance Details: verbal cues for safe use of RW, step length Gait Pattern: Step-to pattern Gait velocity: decreased    Exercises     PT Diagnosis:    PT Problem List:   PT Treatment Interventions:     PT Goals Acute Rehab PT Goals PT Goal: Sit to Stand - Progress: Progressing toward goal PT Goal: Stand to Sit - Progress: Progressing toward goal PT Goal: Ambulate - Progress: Progressing toward goal  Visit Information  Last PT Received On: 06/13/12 Assistance Needed:  +1    Subjective Data  Subjective: "I'm feeling less nauseated but let's wait to do exercises."   Cognition  Overall Cognitive Status: Appears within functional limits for tasks assessed/performed Behavior During Session: [facility] for tasks performed    Balance     End of Session PT - End of Session Equipment Utilized During Treatment: Right knee immobilizer Activity Tolerance: Patient tolerated treatment well Patient left: in chair;with call bell/phone within reach;with family/visitor present   GP     Captain Blucher,KATHrine E 06/13/2012, 11:54 AM Pager: [phone]

## 2012-06-13 NOTE — Progress Notes (Signed)
   Subjective: 2 Days Post-Op Procedure(s) (LRB): TOTAL KNEE ARTHROPLASTY (Right) Patient reports pain as mild and moderate.   Patient seen in rounds with Dr. Lequita Halt. Wife in room. Patient is well, but has had some minor complaints of nausea/vomiting likely due to pain meds.  Take with food. Plan is to go Home after hospital stay.  Objective: Vital signs in last 24 hours: Temp:  [98.5 F (36.9 C)-99 F (37.2 C)] 99 F (37.2 C) (07/13 7846) Pulse Rate:  [89-100] 98  (07/13 0735) Resp:  [16-18] 16  (07/13 0735) BP: (121-158)/(72-84) 126/72 mmHg (07/13 0735) SpO2:  [93 %-97 %] 95 % (07/13 0735)  Intake/Output from previous day:  Intake/Output Summary (Last 24 hours) at 06/13/12 0922 Last data filed at 06/13/12 0820  Gross per 24 hour  Intake    780 ml  Output   1200 ml  Net   -420 ml    Intake/Output this shift: Total I/O In: 60 [P.O.:60] Out: -   Labs:  Basename 06/13/12 0436 06/12/12 0432  HGB 12.5* 13.1    Basename 06/13/12 0436 06/12/12 0432  WBC 13.2* 16.4*  RBC 4.39 4.60  HCT 37.6* 38.5*  PLT 239 249    Basename 06/12/12 0432  NA 135  K 4.0  CL 99  CO2 25  BUN 15  CREATININE 0.95  GLUCOSE 173*  CALCIUM 9.0   No results found for this basename: LABPT:2,INR:2 in the last 72 hours  EXAM General - Patient is Alert, Appropriate and Oriented Extremity - Neurovascular intact Sensation intact distally Dorsiflexion/Plantar flexion intact No cellulitis present Dressing/Incision - clean, dry, no drainage, healing Motor Function - intact, moving foot and toes well on exam.   Past Medical History  Diagnosis Date  . HYPERLIPIDEMIA 03/28/2010  . OBESITY, MODERATE 04/25/2009  . ERECTILE DYSFUNCTION 04/25/2009  . Acute serous otitis media 02/22/2010  . HYPERTENSION 03/24/2009  . GERD 03/24/2009  . PONV (postoperative nausea and vomiting)   . Arthritis     Assessment/Plan: 2 Days Post-Op Procedure(s) (LRB): TOTAL KNEE ARTHROPLASTY (Right) Active  Problems:  * No active hospital problems. *    Up with therapy Plan for discharge tomorrow Discharge home with home health  DVT Prophylaxis - Xarelto Weight-Bearing as tolerated to right leg  Clifford Gibson 06/13/2012, 9:22 AM

## 2012-06-13 NOTE — Progress Notes (Signed)
Physical Therapy Treatment Note   06/13/12 1500  PT Visit Information  Last PT Received On 06/13/12  Assistance Needed +1  PT Time Calculation  PT Start Time 1359  PT Stop Time 1428  PT Time Calculation (min) 29 min  Subjective Data  Subjective "I'm ready, let's go."  Precautions  Precautions Knee  Required Braces or Orthoses Knee Immobilizer - Right  Knee Immobilizer - Right Discontinue once straight leg raise with < 10 degree lag  Restrictions  Other Position/Activity Restrictions WBAT  Cognition  Overall Cognitive Status Appears within functional limits for tasks assessed/performed  Transfers  Transfers Sit to Stand;Stand to Sit  Sit to Stand 4: Min guard;With upper extremity assist;With armrests;From chair/3-in-1  Stand to Sit 4: Min guard;With upper extremity assist;With armrests;To chair/3-in-1  Details for Transfer Assistance verbal cues for R LE forward  Ambulation/Gait  Ambulation/Gait Assistance 5: Supervision  Ambulation Distance (Feet) 120 Feet  Assistive device Rolling walker  Ambulation/Gait Assistance Details verbal cues for step length and RW distance  Gait Pattern Step-to pattern  Gait velocity decreased  Total Joint Exercises  Ankle Circles/Pumps AROM;Both;20 reps  Quad Sets AROM;Strengthening;Both;20 reps  Heel Slides AAROM;Right;20 reps  Straight Leg Raises AAROM;Strengthening;Right;10 reps  Hip ABduction/ADduction Strengthening;Right;AROM;20 reps  Short Arc Quad AAROM;Strengthening;Right;20 reps  Goniometric ROM approx -4-55* R knee with exercises  PT - End of Session  Equipment Utilized During Treatment Right knee immobilizer  Activity Tolerance Patient tolerated treatment well  Patient left in chair;with call bell/phone within reach;with family/visitor present  PT - Assessment/Plan  Comments on Treatment Session Pt doing well with mobility and performed exercises.  PT Plan Discharge plan remains appropriate  Follow Up Recommendations Home health  PT  Equipment Recommended Rolling walker with 5" wheels  Acute Rehab PT Goals  PT Goal: Sit to Stand - Progress Progressing toward goal  PT Goal: Stand to Sit - Progress Progressing toward goal  PT Goal: Ambulate - Progress Partly met  PT General Charges  $$ ACUTE PT VISIT 1 Procedure  PT Treatments  $Gait Training 8-22 mins  $Therapeutic Exercise 8-22 mins    Zenovia Jarred, PT Pager: [phone]

## 2012-06-14 LAB — CBC
HCT: 36.6 % — ABNORMAL LOW (ref 39.0–52.0)
Hemoglobin: 12.4 g/dL — ABNORMAL LOW (ref 13.0–17.0)
MCH: 28.5 pg (ref 26.0–34.0)
MCV: 84.1 fL (ref 78.0–100.0)
Platelets: 223 10*3/uL (ref 150–400)
RBC: 4.35 MIL/uL (ref 4.22–5.81)

## 2012-06-14 MED ORDER — METHOCARBAMOL 500 MG PO TABS: 500.0000 mg | ORAL_TABLET | Freq: Four times a day (QID) | ORAL | Status: DC | PRN

## 2012-06-14 MED ORDER — TRAMADOL HCL 50 MG PO TABS: 50.0000 mg | ORAL_TABLET | Freq: Four times a day (QID) | ORAL | Status: DC | PRN

## 2012-06-14 MED ORDER — TRAMADOL HCL 50 MG PO TABS: 50.0000 mg | ORAL_TABLET | Freq: Four times a day (QID) | ORAL | Status: AC | PRN

## 2012-06-14 MED ORDER — OXYCODONE HCL 5 MG PO TABS: 5.0000 mg | ORAL_TABLET | ORAL | Status: DC | PRN

## 2012-06-14 MED ORDER — RIVAROXABAN 10 MG PO TABS: 10.0000 mg | ORAL_TABLET | Freq: Every day | ORAL | Status: DC

## 2012-06-14 MED ORDER — METHOCARBAMOL 500 MG PO TABS: 500.0000 mg | ORAL_TABLET | Freq: Four times a day (QID) | ORAL | Status: AC | PRN

## 2012-06-14 NOTE — Progress Notes (Signed)
Physical Therapy Treatment Patient Details Name: Clifford Gibson MRN: [id number] DOB: 09/30/1946 Today's Date: 06/14/2012 Time: 8295-6213 PT Time Calculation (min): 24 min  PT Assessment / Plan / Recommendation Comments on Treatment Session  Pt doing well with mobility and performed exercises. Reviewed HEP with pt/wife, handout issued. Verbally instructed pt/wife in ascending/descending 1 step with RW, pt didn't want to practice this. Ready to DC home from PT standpoint.     Follow Up Recommendations  Home health PT    Barriers to Discharge        Equipment Recommendations  Rolling walker with 5" wheels    Recommendations for Other Services    Frequency 7X/week   Plan Discharge plan remains appropriate;Frequency remains appropriate    Precautions / Restrictions Precautions Precautions: Knee Required Braces or Orthoses: Knee Immobilizer - Right Knee Immobilizer - Right: Discontinue once straight leg raise with < 10 degree lag Restrictions Weight Bearing Restrictions: No Other Position/Activity Restrictions: WBAT   Pertinent Vitals/Pain **pt reports 0/10 pain Ice applied after PT*    Mobility  Transfers Transfers: Sit to Stand;Stand to Sit Sit to Stand: With upper extremity assist;With armrests;From chair/3-in-1;5: Supervision Stand to Sit: With upper extremity assist;With armrests;To chair/3-in-1;5: Supervision Details for Transfer Assistance: verbal cues for R LE forward Ambulation/Gait Ambulation/Gait Assistance: 5: Supervision Ambulation Distance (Feet): 140 Feet Assistive device: Rolling walker Gait Pattern: Step-through pattern;Decreased weight shift to right Gait velocity: decreased    Exercises Total Joint Exercises Ankle Circles/Pumps: AROM;Both;20 reps Quad Sets: AROM;Strengthening;Both;20 reps Short Arc Quad: AAROM;Strengthening;Right;20 reps Heel Slides: AAROM;Right;20 reps Straight Leg Raises: AAROM;Strengthening;Right;10 reps Long Arc Quad:  AAROM;Right;Strengthening;10 reps;Seated Knee Flexion: AROM;Right;10 reps;Seated   PT Diagnosis:    PT Problem List:   PT Treatment Interventions:     PT Goals Acute Rehab PT Goals PT Goal Formulation: With patient Time For Goal Achievement: 06/17/12 Potential to Achieve Goals: Good Pt will go Supine/Side to Sit: with supervision Pt will go Sit to Supine/Side: with supervision Pt will go Sit to Stand: with supervision PT Goal: Sit to Stand - Progress: Met Pt will go Stand to Sit: with supervision PT Goal: Stand to Sit - Progress: Met Pt will Ambulate: >150 feet;with supervision;with rolling walker PT Goal: Ambulate - Progress: Partly met Pt will Go Up / Down Stairs: 1-2 stairs;with min assist;with least restrictive assistive device  Visit Information  Last PT Received On: 06/14/12 Assistance Needed: +1    Subjective Data  Subjective: I'm ready to rock and roll! Patient Stated Goal: lose weight   Cognition  Overall Cognitive Status: Appears within functional limits for tasks assessed/performed Arousal/Alertness: Awake/alert Orientation Level: Appears intact for tasks assessed Behavior During Session: [name] for tasks performed    Balance     End of Session PT - End of Session Equipment Utilized During Treatment: Right knee immobilizer Activity Tolerance: Patient tolerated treatment well Patient left: in chair;with call bell/phone within reach;with family/visitor present Nurse Communication: Mobility status CPM Right Knee CPM Right Knee: Off   GP     Ralene Bathe Kistler 06/14/2012, 10:23 AM [phone]

## 2012-06-14 NOTE — Progress Notes (Signed)
Occupational Therapy Treatment Patient Details Name: Clifford Gibson MRN: [id number] DOB: 10-07-1946 Today's Date: 06/14/2012 Time: 4098-1191 OT Time Calculation (min): 13 min  OT Assessment / Plan / Recommendation Comments on Treatment Session Pt educated in shower transfer and use of 3 in 1 as shower seat.  Pt and wife are knowledgeable in AE for LB ADL.  Pt is ready for d/c.    Follow Up Recommendations  No OT follow up    Barriers to Discharge       Equipment Recommendations  Rolling walker with 5" wheels;3 in 1 bedside comode    Recommendations for Other Services    Frequency Min 2X/week   Plan Discharge plan remains appropriate    Precautions / Restrictions Precautions Precautions: Knee Required Braces or Orthoses: Knee Immobilizer - Right Knee Immobilizer - Right: Discontinue once straight leg raise with < 10 degree lag Restrictions Weight Bearing Restrictions: No Other Position/Activity Restrictions: WBAT   Pertinent Vitals/Pain No pain    ADL  Tub/Shower Transfer: Performed;Min guard Tub/Shower Transfer Method: Science writer: Other (comment);Walk in shower (3 in 1) Equipment Used: Rolling walker;Knee Immobilizer Transfers/Ambulation Related to ADLs: supervision with RW ADL Comments: Wife present for education in shower transfer.  Recommended long handled sponge.    OT Diagnosis:    OT Problem List:   OT Treatment Interventions:     OT Goals ADL Goals Pt Will Transfer to Toilet: Ambulation;with supervision;3-in-1 ADL Goal: Toilet Transfer - Progress: Met Pt Will Perform Tub/Shower Transfer: Shower transfer;Ambulation;with supervision ADL Goal: Tub/Shower Transfer - Progress: Progressing toward goals  Visit Information  Last OT Received On: 06/14/12 Assistance Needed: +1    Subjective Data      Prior Functioning       Cognition  Overall Cognitive Status: Appears within functional limits for tasks  assessed/performed Arousal/Alertness: Awake/alert Orientation Level: Appears intact for tasks assessed Behavior During Session: [name] for tasks performed    Mobility Transfers Sit to Stand: 5: Supervision;With upper extremity assist;From bed Stand to Sit: 5: Supervision;With upper extremity assist;To chair/3-in-1 Details for Transfer Assistance: verbal cues for R LE forward   Exercises   Balance    End of Session OT - End of Session Activity Tolerance: Patient tolerated treatment well Patient left: in chair;with call bell/phone within reach;with family/visitor present CPM Right Knee CPM Right Knee: Off  GO     Evern Bio 06/14/2012, 11:33 AM

## 2012-06-14 NOTE — Discharge Summary (Signed)
Patient ID: BURECH MCFARLAND MRN: [id number] DOB/AGE: [birth year] 67 y.o.  Admit date: 06/11/2012 Discharge date: 06/14/2012  Admission Diagnoses: DJD knee Active Problems:  * No active hospital problems. *    Discharge Diagnoses: DJD knee Same  Past Medical History  Diagnosis Date  . HYPERLIPIDEMIA 03/28/2010  . OBESITY, MODERATE 04/25/2009  . ERECTILE DYSFUNCTION 04/25/2009  . Acute serous otitis media 02/22/2010  . HYPERTENSION 03/24/2009  . GERD 03/24/2009  . PONV (postoperative nausea and vomiting)   . Arthritis     Surgeries: Procedure(s): TOTAL KNEE ARTHROPLASTY on 06/11/2012   Consultants:    Discharged Condition: Improved  Hospital Course: ELY BALLEN is an 66 y.o. male who was admitted 06/11/2012 for operative treatment of<principal problem not specified>. Patient has severe unremitting pain that affects sleep, daily activities, and work/hobbies. After pre-op clearance the patient was taken to the operating room on 06/11/2012 and underwent  Procedure(s): TOTAL KNEE ARTHROPLASTY.    Patient was given perioperative antibiotics: Anti-infectives     Start     Dose/Rate Route Frequency Ordered Stop   06/11/12 2000   vancomycin (VANCOCIN) IVPB 1000 mg/200 mL premix        1,000 mg 200 mL/hr over 60 Minutes Intravenous Every 12 hours 06/11/12 1140 06/12/12 2043   06/11/12 2000   ciprofloxacin (CIPRO) IVPB 400 mg        400 mg 200 mL/hr over 60 Minutes Intravenous Every 12 hours 06/11/12 1140 06/12/12 0936   06/11/12 0805   polymyxin B 500,000 Units, bacitracin 50,000 Units in sodium chloride irrigation 0.9 % 500 mL irrigation  Status:  Discontinued          As needed 06/11/12 0805 06/11/12 0949   06/11/12 0524   vancomycin (VANCOCIN) 1,500 mg in sodium chloride 0.9 % 500 mL IVPB        1,500 mg 250 mL/hr over 120 Minutes Intravenous 120 min pre-op 06/11/12 0524 06/11/12 0730   06/11/12 0524   ciprofloxacin (CIPRO) IVPB 400 mg        400 mg 200 mL/hr over 60  Minutes Intravenous 60 min pre-op 06/11/12 0524 06/11/12 0713           Patient was given sequential compression devices, early ambulation, and chemoprophylaxis to prevent DVT.  Patient benefited maximally from hospital stay and there were no complications.    Recent vital signs: Patient Vitals for the past 24 hrs:  BP Temp Temp src Pulse Resp SpO2  July 02, 2012 2126 125/94 mmHg 98.4 F (36.9 C) Oral 94  16  94 %  02-Jul-2012 1405 147/90 mmHg 98.1 F (36.7 C) Oral 100  16  95 %  2012-07-02 0735 126/72 mmHg - - 98  16  95 %  02-Jul-2012 0632 121/78 mmHg 99 F (37.2 C) Oral 89  16  95 %     Recent laboratory studies:  Basename 06/14/12 0450 02-Jul-2012 0436 06/12/12 0432  WBC 11.8* 13.2* --  HGB 12.4* 12.5* --  HCT 36.6* 37.6* --  PLT 223 239 --  NA -- -- 135  K -- -- 4.0  CL -- -- 99  CO2 -- -- 25  BUN -- -- 15  CREATININE -- -- 0.95  GLUCOSE -- -- 173*  INR -- -- --  CALCIUM -- -- 9.0     Discharge Medications:   Medication List  As of 06/14/2012  6:27 AM   ASK your doctor about these medications         ANDROGEL PUMP 1.25  GM/ACT (1%) Gel   Generic drug: Testosterone   USE 1 PUMP DAILY OR USE AS DIRECTED      aspirin 81 MG chewable tablet   Chew 81 mg by mouth daily with breakfast.      CO Q 10 PO   Take 200 mg by mouth daily.      DHA Omega 3 100 MG Caps   Take 1 capsule by mouth daily.      Flax Seed Oil 1000 MG Caps   Take 1 capsule by mouth daily.      fluticasone 50 MCG/ACT nasal spray   Commonly known as: FLONASE   Place 1 spray into the nose daily with breakfast.      MAG-OX 400 PO   Take 400 mg by mouth daily with breakfast.      meclizine 25 MG tablet   Commonly known as: ANTIVERT   Take 25 mg by mouth 3 (three) times daily as needed. For dizziness      OMEGA 3 1200 MG Caps   Take 1 capsule by mouth daily.      omeprazole 20 MG capsule   Commonly known as: PRILOSEC   Take 20 mg by mouth daily with breakfast.      sildenafil 100 MG tablet    Commonly known as: VIAGRA   Take 100 mg by mouth daily as needed. For ED      valsartan-hydrochlorothiazide 160-12.5 MG per tablet   Commonly known as: DIOVAN-HCT   Take 1 tablet by mouth at bedtime.      vitamin C 1000 MG tablet   Take 1,000 mg by mouth daily with breakfast.            Diagnostic Studies: Dg Knee 1-2 Views Right  06/12/2012  *RADIOLOGY REPORT*  Clinical Data: Postop right knee replacement  RIGHT KNEE - 1-2 VIEW  Comparison: Right knee films of 06/03/2012  Findings: The femoral and tibial components of the right total knee replacement appear to be in good position and alignment.  No fracture is seen.  Air is noted of the in the soft tissues postoperatively and a surgical drain remains.  There may be a small joint effusion present.  IMPRESSION: Right total knee replacement components in good position and alignment.  Original Report Authenticated By: Juline Patch, M.D.   Dg Knee 1-2 Views Right  06/03/2012  *RADIOLOGY REPORT*  Clinical Data: Preop radiograph  RIGHT KNEE - 1-2 VIEW  Comparison: None  Findings: There is no evidence of fracture or dislocation.  Mild medial compartment narrowing and marginal spur formation is identified.  Soft tissues are unremarkable.  IMPRESSION:  Mild osteoarthritis.  Original Report Authenticated By: Rosealee Albee, M.D.    Disposition: Final discharge disposition not confirmed. Satisfactory condition on discharge       Signed: Tashonna Descoteaux C 06/14/2012, 6:27 AM

## 2012-06-14 NOTE — Progress Notes (Signed)
   Subjective: 3 Days Post-Op Procedure(s) (LRB): TOTAL KNEE ARTHROPLASTY (Right) Patient reports pain as mild.   Patient seen in rounds by Dr. Lequita Halt. Patient is well, and has had no acute complaints or problems Patient is ready to go home.  Objective: Vital signs in last 24 hours: Temp:  [98.1 F (36.7 C)-98.5 F (36.9 C)] 98.5 F (36.9 C) (07/14 0610) Pulse Rate:  [84-100] 84  (07/14 0610) Resp:  [16-20] 20  (07/14 0610) BP: (125-151)/(88-94) 151/88 mmHg (07/14 0610) SpO2:  [94 %-96 %] 96 % (07/14 0610)  Intake/Output from previous day:  Intake/Output Summary (Last 24 hours) at 06/14/12 0851 Last data filed at 06/14/12 0816  Gross per 24 hour  Intake    480 ml  Output    250 ml  Net    230 ml    Intake/Output this shift: Total I/O In: 240 [P.O.:240] Out: -   Labs:  Basename 06/14/12 0450 06/13/12 0436 06/12/12 0432  HGB 12.4* 12.5* 13.1    Basename 06/14/12 0450 06/13/12 0436  WBC 11.8* 13.2*  RBC 4.35 4.39  HCT 36.6* 37.6*  PLT 223 239    Basename 06/12/12 0432  NA 135  K 4.0  CL 99  CO2 25  BUN 15  CREATININE 0.95  GLUCOSE 173*  CALCIUM 9.0   No results found for this basename: LABPT:2,INR:2 in the last 72 hours  EXAM: General - Patient is Alert, Appropriate and Oriented Extremity - Neurovascular intact Sensation intact distally Incision - clean, dry, no drainage Motor Function - intact, moving foot and toes well on exam.   Assessment/Plan: 3 Days Post-Op Procedure(s) (LRB): TOTAL KNEE ARTHROPLASTY (Right) Procedure(s) (LRB): TOTAL KNEE ARTHROPLASTY (Right) Past Medical History  Diagnosis Date  . HYPERLIPIDEMIA 03/28/2010  . OBESITY, MODERATE 04/25/2009  . ERECTILE DYSFUNCTION 04/25/2009  . Acute serous otitis media 02/22/2010  . HYPERTENSION 03/24/2009  . GERD 03/24/2009  . PONV (postoperative nausea and vomiting)   . Arthritis    Active Problems:  * No active hospital problems. *    Up with therapy Discharge home with home  health Diet - Cardiac diet Follow up - in 2 weeks Activity - WBAT Disposition - Home Condition Upon Discharge - Good D/C Meds - See DC Summary DVT Prophylaxis - Xarelto  PERKINS, ALEXZANDREW 06/14/2012, 8:51 AM

## 2012-06-14 NOTE — Progress Notes (Signed)
Discharged from floor via w/c, spouse with pt. No changes in assessment. Janele Lague   

## 2012-07-07 ENCOUNTER — Ambulatory Visit: Payer: Medicare Other | Attending: Specialist | Admitting: Physical Therapy

## 2012-07-07 DIAGNOSIS — IMO0001 Reserved for inherently not codable concepts without codable children: Secondary | ICD-10-CM | POA: Insufficient documentation

## 2012-07-07 DIAGNOSIS — R5381 Other malaise: Secondary | ICD-10-CM | POA: Insufficient documentation

## 2012-07-07 DIAGNOSIS — M25669 Stiffness of unspecified knee, not elsewhere classified: Secondary | ICD-10-CM | POA: Insufficient documentation

## 2012-07-07 DIAGNOSIS — M25569 Pain in unspecified knee: Secondary | ICD-10-CM | POA: Insufficient documentation

## 2012-07-08 ENCOUNTER — Ambulatory Visit: Payer: Medicare Other | Admitting: Physical Therapy

## 2012-07-10 ENCOUNTER — Ambulatory Visit: Payer: Medicare Other | Admitting: Physical Therapy

## 2012-07-13 ENCOUNTER — Ambulatory Visit: Payer: Medicare Other | Admitting: Physical Therapy

## 2012-07-15 ENCOUNTER — Ambulatory Visit: Payer: Medicare Other | Admitting: Physical Therapy

## 2012-07-16 ENCOUNTER — Ambulatory Visit: Payer: Medicare Other | Admitting: Physical Therapy

## 2012-07-20 ENCOUNTER — Ambulatory Visit: Payer: Medicare Other | Admitting: Physical Therapy

## 2012-07-22 ENCOUNTER — Ambulatory Visit: Payer: Medicare Other | Admitting: Physical Therapy

## 2012-07-23 ENCOUNTER — Ambulatory Visit: Payer: Medicare Other | Admitting: Physical Therapy

## 2012-07-27 ENCOUNTER — Ambulatory Visit: Payer: Medicare Other | Admitting: Physical Therapy

## 2012-07-29 ENCOUNTER — Ambulatory Visit: Payer: Medicare Other | Admitting: Physical Therapy

## 2012-07-30 ENCOUNTER — Ambulatory Visit: Payer: Medicare Other | Admitting: Physical Therapy

## 2012-09-08 ENCOUNTER — Ambulatory Visit: Payer: Medicare Other | Attending: Specialist | Admitting: Physical Therapy

## 2012-09-08 DIAGNOSIS — M25669 Stiffness of unspecified knee, not elsewhere classified: Secondary | ICD-10-CM | POA: Insufficient documentation

## 2012-09-08 DIAGNOSIS — IMO0001 Reserved for inherently not codable concepts without codable children: Secondary | ICD-10-CM | POA: Insufficient documentation

## 2012-09-08 DIAGNOSIS — M25569 Pain in unspecified knee: Secondary | ICD-10-CM | POA: Insufficient documentation

## 2012-09-08 DIAGNOSIS — R5381 Other malaise: Secondary | ICD-10-CM | POA: Insufficient documentation

## 2012-09-10 ENCOUNTER — Ambulatory Visit: Payer: Medicare Other | Admitting: Physical Therapy

## 2012-09-15 ENCOUNTER — Ambulatory Visit: Payer: Medicare Other | Admitting: Physical Therapy

## 2012-09-17 ENCOUNTER — Encounter: Payer: Medicare Other | Admitting: Physical Therapy

## 2013-01-05 ENCOUNTER — Other Ambulatory Visit: Payer: Self-pay | Admitting: Family Medicine

## 2013-05-13 ENCOUNTER — Other Ambulatory Visit: Payer: Self-pay | Admitting: Family Medicine

## 2013-05-14 ENCOUNTER — Ambulatory Visit (INDEPENDENT_AMBULATORY_CARE_PROVIDER_SITE_OTHER): Payer: Medicare Other | Admitting: Family Medicine

## 2013-05-14 ENCOUNTER — Encounter: Payer: Self-pay | Admitting: Family Medicine

## 2013-05-14 VITALS — BP 148/88 | Temp 98.6°F | Wt 236.0 lb

## 2013-05-14 DIAGNOSIS — I10 Essential (primary) hypertension: Secondary | ICD-10-CM

## 2013-05-14 DIAGNOSIS — J309 Allergic rhinitis, unspecified: Secondary | ICD-10-CM | POA: Insufficient documentation

## 2013-05-14 MED ORDER — VALSARTAN-HYDROCHLOROTHIAZIDE 160-12.5 MG PO TABS: 1.0000 | ORAL_TABLET | Freq: Every day | ORAL | Status: DC

## 2013-05-14 MED ORDER — FLUTICASONE PROPIONATE 50 MCG/ACT NA SUSP: 1.0000 | Freq: Every day | NASAL | Status: DC

## 2013-05-14 NOTE — Progress Notes (Signed)
  Subjective:    Patient ID: Clifford Gibson, male    DOB: 1945/12/26, 67 y.o.   MRN: [id number]  HPI Medical followup History of obesity, osteoarthritis, hypertension, hyperlipidemia, GERD, erectile dysfunction History of low testosterone and he has decided against testosterone replacement. Blood pressure treated with Diovan HCTZ and generally well-controlled by home readings No headaches. No dizziness. Allergies treated with Flonase. Well-controlled  He has GERD treated with over-the-counter omeprazole and stable. Right total knee replacement since last year. He's done well since then  Past Medical History  Diagnosis Date  . HYPERLIPIDEMIA 03/28/2010  . OBESITY, MODERATE 04/25/2009  . ERECTILE DYSFUNCTION 04/25/2009  . Acute serous otitis media 02/22/2010  . HYPERTENSION 03/24/2009  . GERD 03/24/2009  . PONV (postoperative nausea and vomiting)   . Arthritis    Past Surgical History  Procedure Laterality Date  . Nasal septum surgery  1974  . Tonsillectomy      3 yrs. old  . Total knee arthroplasty  06/11/2012    Procedure: TOTAL KNEE ARTHROPLASTY;  Surgeon: Javier Docker, MD;  Location: [facility] ORS;  Service: Orthopedics;  Laterality: Right;    reports that he has never smoked. He has never used smokeless tobacco. He reports that he does not drink alcohol or use illicit drugs. family history includes Dementia in his father and Scleroderma in his mother. Allergies  Allergen Reactions  . Penicillins Other (See Comments)    Reaction=bleeding,diarrhea      Review of Systems  Constitutional: Negative for fatigue and unexpected weight change.  Eyes: Negative for visual disturbance.  Respiratory: Negative for cough, chest tightness and shortness of breath.   Cardiovascular: Negative for chest pain, palpitations and leg swelling.  Neurological: Negative for dizziness, syncope, weakness, light-headedness and headaches.       Objective:   Physical Exam  Constitutional: He appears  well-developed and well-nourished.  Neck: Neck supple. No thyromegaly present.  Cardiovascular: Normal rate and regular rhythm.   Pulmonary/Chest: Effort normal and breath sounds normal. No respiratory distress. He has no wheezes. He has no rales.  Musculoskeletal: He exhibits no edema.          Assessment & Plan:  #1 hypertension. Adequate control by home readings. Refill medication for one year #2 seasonal allergic rhinitis. Stable. Refill Flonase for one year  Recommend consider complete physical at some point this year

## 2013-10-07 ENCOUNTER — Other Ambulatory Visit: Payer: Self-pay

## 2014-02-03 ENCOUNTER — Ambulatory Visit (INDEPENDENT_AMBULATORY_CARE_PROVIDER_SITE_OTHER): Payer: Medicare Other | Admitting: Family Medicine

## 2014-02-03 ENCOUNTER — Encounter: Payer: Self-pay | Admitting: Family Medicine

## 2014-02-03 VITALS — BP 138/90 | HR 79 | Wt 231.0 lb

## 2014-02-03 DIAGNOSIS — Z23 Encounter for immunization: Secondary | ICD-10-CM

## 2014-02-03 DIAGNOSIS — R1012 Left upper quadrant pain: Secondary | ICD-10-CM

## 2014-02-03 MED ORDER — PANTOPRAZOLE SODIUM 40 MG PO TBEC: 40.0000 mg | DELAYED_RELEASE_TABLET | Freq: Every day | ORAL | Status: DC

## 2014-02-03 NOTE — Patient Instructions (Signed)
Touch base in 2-3 weeks in no better.

## 2014-02-03 NOTE — Progress Notes (Signed)
Pre visit review using our clinic review tool, if applicable. No additional management support is needed unless otherwise documented below in the visit note. 

## 2014-02-03 NOTE — Progress Notes (Signed)
   Subjective:    Patient ID: Clifford Gibson, male    DOB: 1946/06/23, 68 y.o.   MRN: [id number]  HPI  Patient here with left-sided upper abdominal pain. Patient has pain left upper to midquadrant. He's had this off and on for 10-12 years. Symptoms come and go. Possibly worse after eating processed foods. No stool changes. No appetite or weight changes. No fever. Pain is described as dull. No nausea or vomiting. Improved with Tagamet. Symptoms are moderate in severity. He had colonoscopy 2013 unremarkable. He has been taking recently 2 aspirin per day which is more than his usual of 1. No melena.  Past Medical History  Diagnosis Date  . HYPERLIPIDEMIA 03/28/2010  . OBESITY, MODERATE 04/25/2009  . ERECTILE DYSFUNCTION 04/25/2009  . Acute serous otitis media 02/22/2010  . HYPERTENSION 03/24/2009  . GERD 03/24/2009  . PONV (postoperative nausea and vomiting)   . Arthritis    Past Surgical History  Procedure Laterality Date  . Nasal septum surgery  1974  . Tonsillectomy      3 yrs. old  . Total knee arthroplasty  06/11/2012    Procedure: TOTAL KNEE ARTHROPLASTY;  Surgeon: Javier DockerJeffrey C Beane, MD;  Location: [facility];  Service: Orthopedics;  Laterality: Right;    reports that he has never smoked. He has never used smokeless tobacco. He reports that he does not drink alcohol or use illicit drugs. family history includes Dementia in his father; Scleroderma in his mother. Allergies  Allergen Reactions  . Penicillins Other (See Comments)    Reaction=bleeding,diarrhea      Review of Systems  Constitutional: Negative for fever, chills, appetite change and unexpected weight change.  Respiratory: Negative for cough and shortness of breath.   Cardiovascular: Negative for chest pain.  Gastrointestinal: Positive for abdominal pain. Negative for nausea, vomiting, diarrhea, constipation, blood in stool and abdominal distention.  Genitourinary: Negative for dysuria.  Neurological: Negative for  dizziness.       Objective:   Physical Exam  Constitutional: He appears well-developed and well-nourished.  Cardiovascular: Normal rate and regular rhythm.   Pulmonary/Chest: Effort normal and breath sounds normal. No respiratory distress. He has no wheezes. He has no rales.  Abdominal: Soft. Bowel sounds are normal. He exhibits no distension and no mass. There is no rebound and no guarding.  Minimally tender left upper quadrant to deep palpation. No masses. No guarding or rebound          Assessment & Plan:  Chronic intermittent pain left upper quadrant. Etiology unclear. Patient has taken Tagamet with slight improvement. He recently increased his aspirin. Reduce aspirin back to one daily. Change to Protonix 40 mg once daily. Touch base 2 weeks if no better. He does not have any red flags such as appetite or weight changes, vomiting, stool changes, or a progressive pain. Consider GI referral or UGI x-rays if no better 2 weeks.

## 2014-03-08 IMAGING — CR DG CHEST 2V
2 series · 2 of 2 positions shown · non-contrast
Comparison: None.

CLINICAL DATA: Preop total knee replacement.

CHEST - 2 VIEW

[view not recorded (1 of 2)]
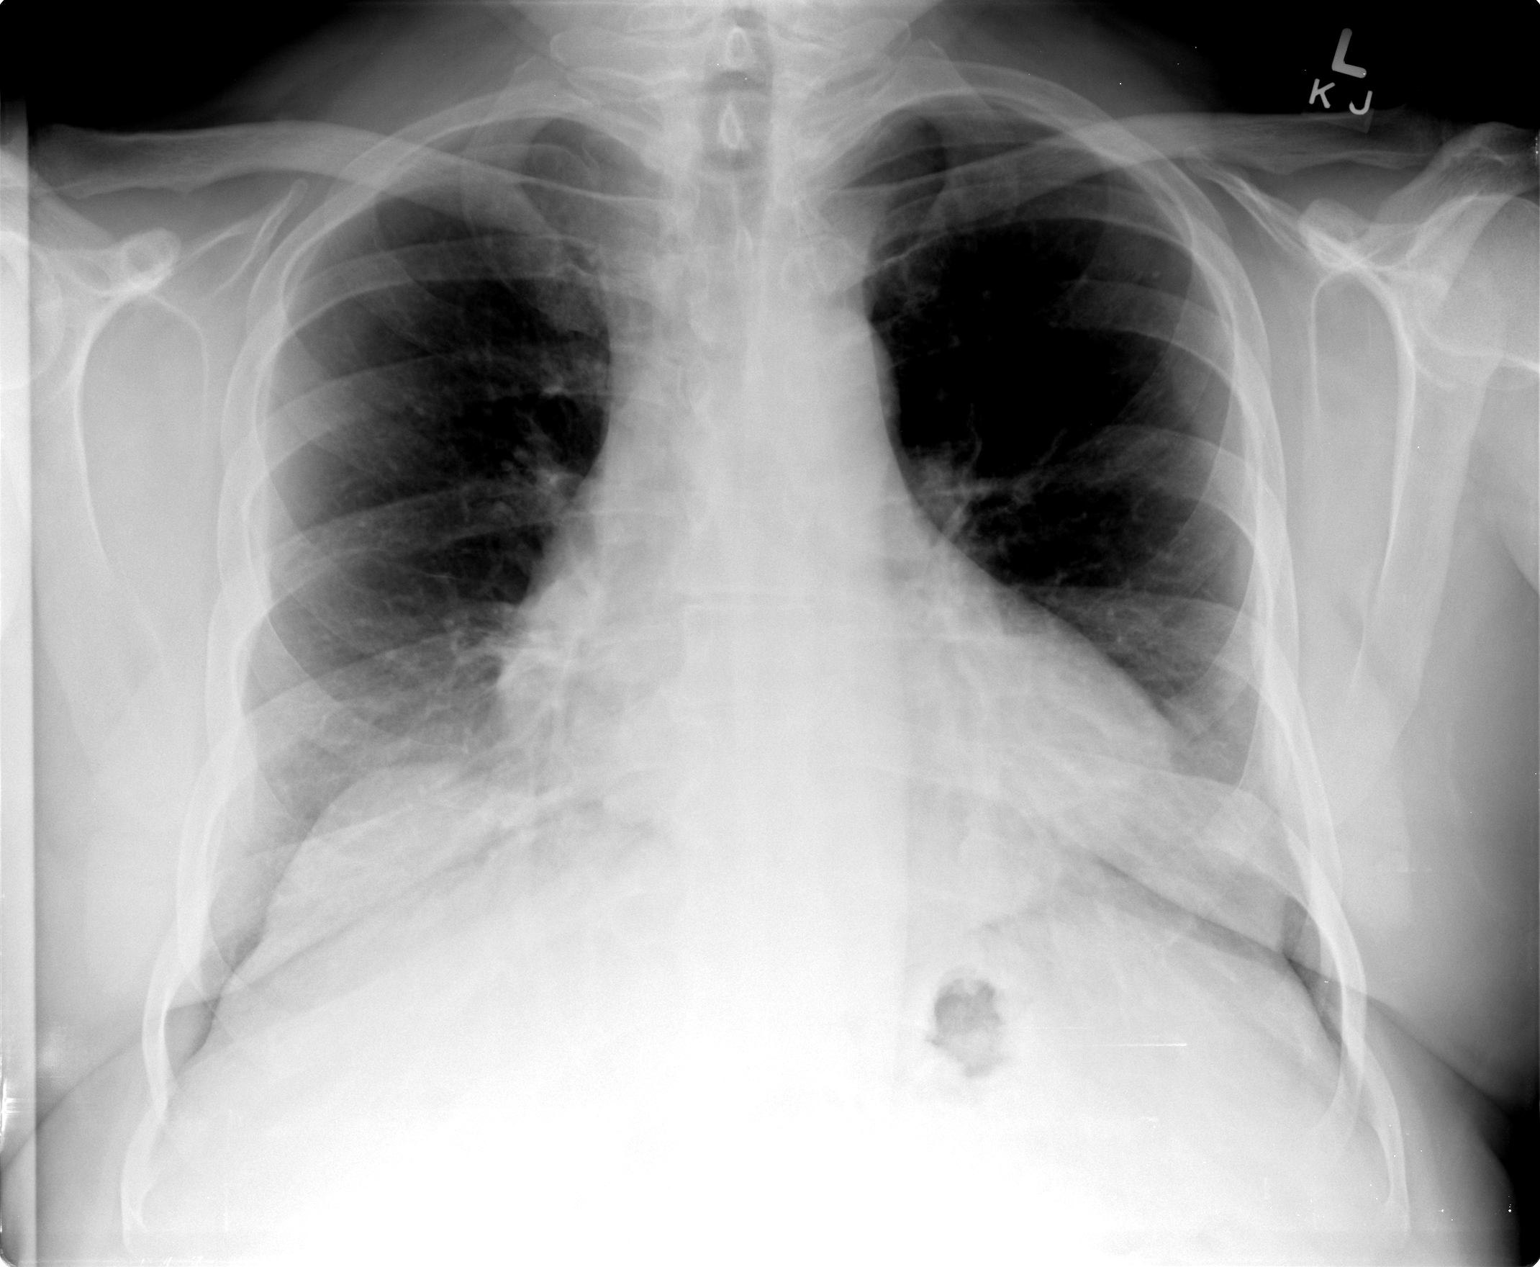

[view not recorded (2 of 2)]
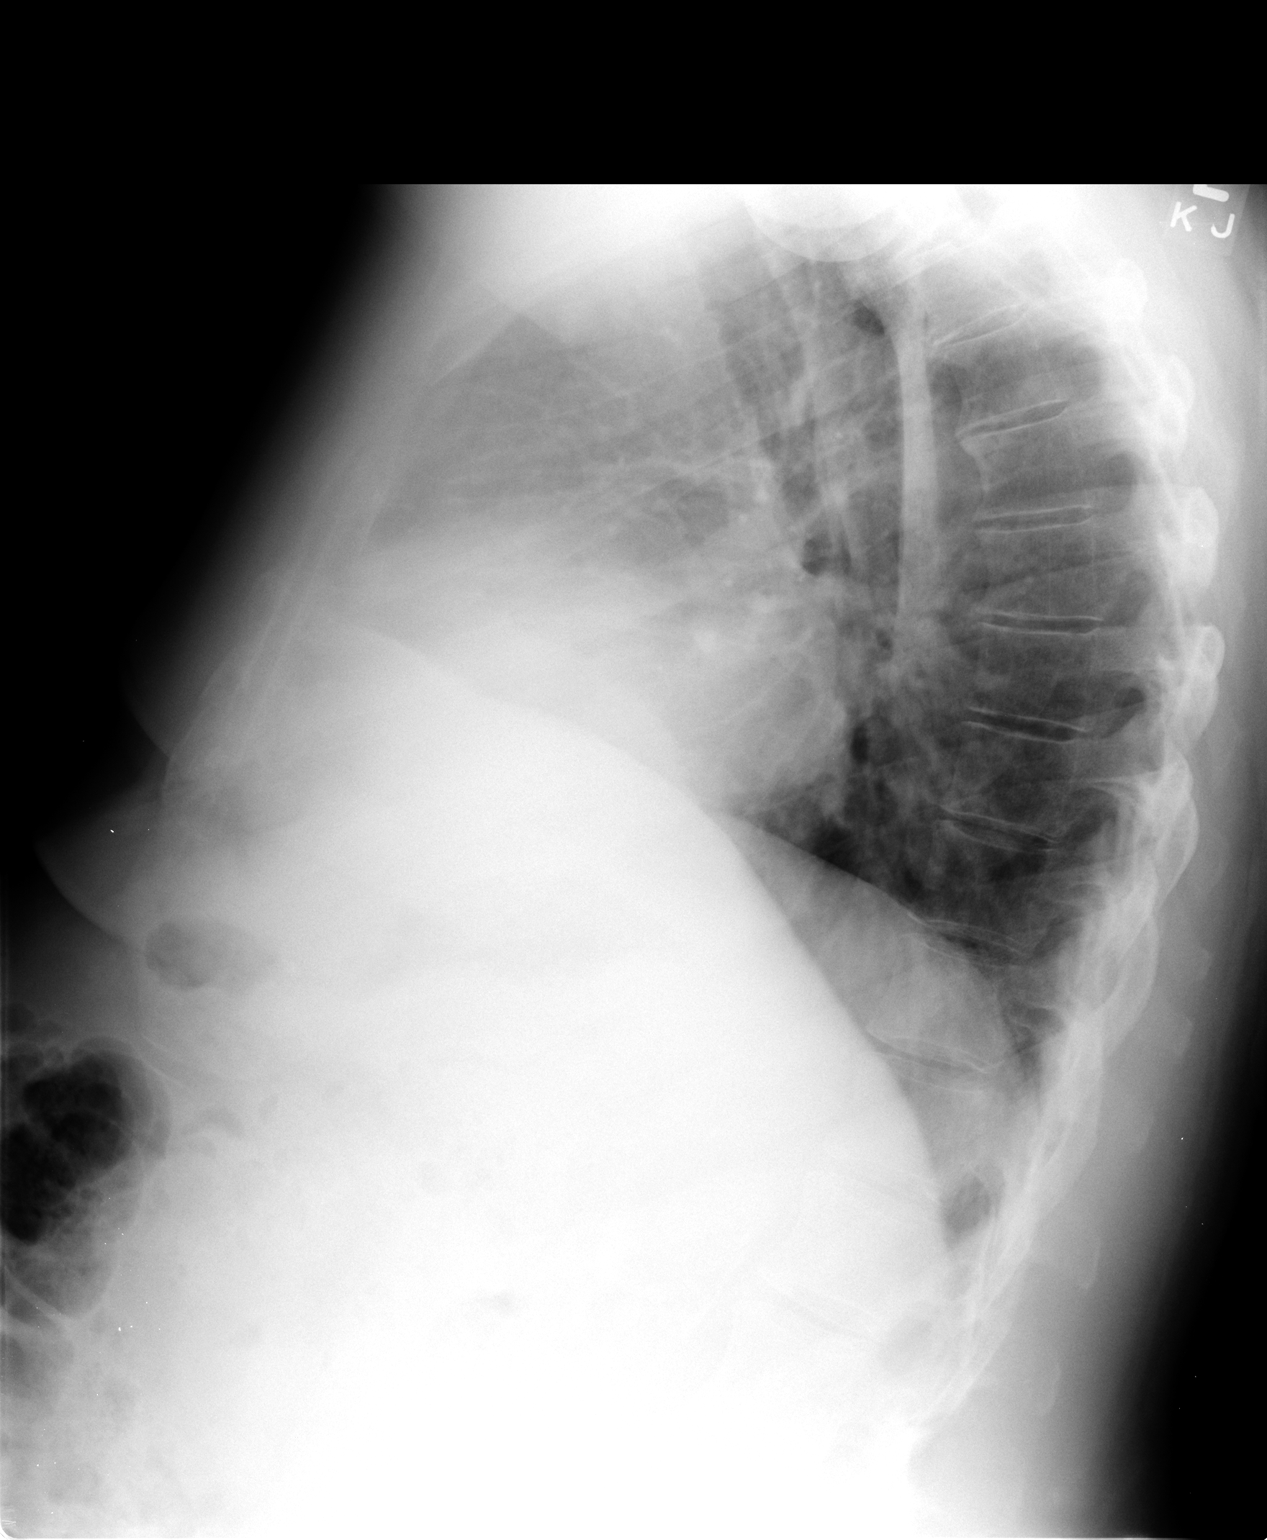

[2 of 2 positions shown; findings below may reference images not displayed]

FINDINGS: Cardiomegaly.  No confluent airspace opacities in the
lungs.  No effusions.  No acute bony abnormality.
IMPRESSION: Cardiomegaly.  No acute findings.

## 2014-05-11 ENCOUNTER — Other Ambulatory Visit: Payer: Self-pay | Admitting: Family Medicine

## 2014-08-25 ENCOUNTER — Other Ambulatory Visit: Payer: Self-pay | Admitting: Family Medicine

## 2015-01-27 ENCOUNTER — Other Ambulatory Visit (INDEPENDENT_AMBULATORY_CARE_PROVIDER_SITE_OTHER): Payer: Medicare Other

## 2015-01-27 DIAGNOSIS — Z0000 Encounter for general adult medical examination without abnormal findings: Secondary | ICD-10-CM

## 2015-01-27 DIAGNOSIS — Z125 Encounter for screening for malignant neoplasm of prostate: Secondary | ICD-10-CM

## 2015-01-27 DIAGNOSIS — I10 Essential (primary) hypertension: Secondary | ICD-10-CM

## 2015-01-27 DIAGNOSIS — E785 Hyperlipidemia, unspecified: Secondary | ICD-10-CM

## 2015-01-27 LAB — COMPREHENSIVE METABOLIC PANEL
ALK PHOS: 65 U/L (ref 39–117)
ALT: 21 U/L (ref 0–53)
AST: 14 U/L (ref 0–37)
Albumin: 4.5 g/dL (ref 3.5–5.2)
BUN: 20 mg/dL (ref 6–23)
CHLORIDE: 100 meq/L (ref 96–112)
CO2: 31 meq/L (ref 19–32)
Calcium: 9.5 mg/dL (ref 8.4–10.5)
Creatinine, Ser: 1.16 mg/dL (ref 0.40–1.50)
GFR: 66.13 mL/min (ref >60.00)
GLUCOSE: 146 mg/dL — AB (ref 70–99)
POTASSIUM: 4.1 meq/L (ref 3.5–5.1)
SODIUM: 138 meq/L (ref 135–145)
Total Bilirubin: 0.6 mg/dL (ref 0.2–1.2)
Total Protein: 6.6 g/dL (ref 6.0–8.3)

## 2015-01-27 LAB — LDL CHOLESTEROL, DIRECT: Direct LDL: 148 mg/dL

## 2015-01-27 LAB — CBC WITH DIFFERENTIAL/PLATELET
BASOS ABS: 0 10*3/uL (ref 0.0–0.1)
Basophils Relative: 0.5 % (ref 0.0–3.0)
EOS ABS: 0.2 10*3/uL (ref 0.0–0.7)
Eosinophils Relative: 1.7 % (ref 0.0–5.0)
HEMATOCRIT: 44.7 % (ref 39.0–52.0)
HEMOGLOBIN: 15.1 g/dL (ref 13.0–17.0)
LYMPHS ABS: 2.4 10*3/uL (ref 0.7–4.0)
Lymphocytes Relative: 26.8 % (ref 12.0–46.0)
MCHC: 33.9 g/dL (ref 30.0–36.0)
MCV: 84.8 fl (ref 78.0–100.0)
MONO ABS: 0.7 10*3/uL (ref 0.1–1.0)
Monocytes Relative: 7.3 % (ref 3.0–12.0)
NEUTROS ABS: 5.8 10*3/uL (ref 1.4–7.7)
Neutrophils Relative %: 63.7 % (ref 43.0–77.0)
Platelets: 273 10*3/uL (ref 150.0–400.0)
RBC: 5.26 Mil/uL (ref 4.22–5.81)
RDW: 14.8 % (ref 11.5–15.5)
WBC: 9.1 10*3/uL (ref 4.0–10.5)

## 2015-01-27 LAB — TSH: TSH: 0.87 u[IU]/mL (ref 0.35–4.50)

## 2015-01-27 LAB — PSA: PSA: 1.11 ng/mL (ref 0.10–4.00)

## 2015-01-27 LAB — LIPID PANEL
CHOL/HDL RATIO: 5
CHOLESTEROL: 223 mg/dL — AB (ref 0–200)
HDL: 44.4 mg/dL (ref >39.00)
NONHDL: 178.6
TRIGLYCERIDES: 217 mg/dL — AB (ref 0.0–149.0)
VLDL: 43.4 mg/dL — AB (ref 0.0–40.0)

## 2015-02-02 ENCOUNTER — Ambulatory Visit (INDEPENDENT_AMBULATORY_CARE_PROVIDER_SITE_OTHER): Payer: Medicare Other | Admitting: Family Medicine

## 2015-02-02 ENCOUNTER — Encounter: Payer: Self-pay | Admitting: Family Medicine

## 2015-02-02 VITALS — BP 134/80 | HR 89 | Temp 98.5°F | Wt 232.0 lb

## 2015-02-02 DIAGNOSIS — E785 Hyperlipidemia, unspecified: Secondary | ICD-10-CM

## 2015-02-02 DIAGNOSIS — Z0000 Encounter for general adult medical examination without abnormal findings: Secondary | ICD-10-CM

## 2015-02-02 DIAGNOSIS — R739 Hyperglycemia, unspecified: Secondary | ICD-10-CM

## 2015-02-02 MED ORDER — ATORVASTATIN CALCIUM 10 MG PO TABS: 10.0000 mg | ORAL_TABLET | Freq: Every day | ORAL | Status: DC

## 2015-02-02 NOTE — Patient Instructions (Signed)
Diabetes Mellitus and Food It is important for you to manage your blood sugar (glucose) level. Your blood glucose level can be greatly affected by what you eat. Eating healthier foods in the appropriate amounts throughout the day at about the same time each day will help you control your blood glucose level. It can also help slow or prevent worsening of your diabetes mellitus. Healthy eating may even help you improve the level of your blood pressure and reach or maintain a healthy weight.  HOW CAN FOOD AFFECT ME? Carbohydrates Carbohydrates affect your blood glucose level more than any other type of food. Your dietitian will help you determine how many carbohydrates to eat at each meal and teach you how to count carbohydrates. Counting carbohydrates is important to keep your blood glucose at a healthy level, especially if you are using insulin or taking certain medicines for diabetes mellitus. Alcohol Alcohol can cause sudden decreases in blood glucose (hypoglycemia), especially if you use insulin or take certain medicines for diabetes mellitus. Hypoglycemia can be a life-threatening condition. Symptoms of hypoglycemia (sleepiness, dizziness, and disorientation) are similar to symptoms of having too much alcohol.  If your health care provider has given you approval to drink alcohol, do so in moderation and use the following guidelines:  Women should not have more than one drink per day, and men should not have more than two drinks per day. One drink is equal to:  12 oz of beer.  5 oz of wine.  1 oz of hard liquor.  Do not drink on an empty stomach.  Keep yourself hydrated. Have water, diet soda, or unsweetened iced tea.  Regular soda, juice, and other mixers might contain a lot of carbohydrates and should be counted. WHAT FOODS ARE NOT RECOMMENDED? As you make food choices, it is important to remember that all foods are not the same. Some foods have fewer nutrients per serving than other  foods, even though they might have the same number of calories or carbohydrates. It is difficult to get your body what it needs when you eat foods with fewer nutrients. Examples of foods that you should avoid that are high in calories and carbohydrates but low in nutrients include:  Trans fats (most processed foods list trans fats on the Nutrition Facts label).  Regular soda.  Juice.  Candy.  Sweets, such as cake, pie, doughnuts, and cookies.  Fried foods. WHAT FOODS CAN I EAT? Have nutrient-rich foods, which will nourish your body and keep you healthy. The food you should eat also will depend on several factors, including:  The calories you need.  The medicines you take.  Your weight.  Your blood glucose level.  Your blood pressure level.  Your cholesterol level. You also should eat a variety of foods, including:  Protein, such as meat, poultry, fish, tofu, nuts, and seeds (lean animal proteins are best).  Fruits.  Vegetables.  Dairy products, such as milk, cheese, and yogurt (low fat is best).  Breads, grains, pasta, cereal, rice, and beans.  Fats such as olive oil, trans fat-free margarine, canola oil, avocado, and olives. DOES EVERYONE WITH DIABETES MELLITUS HAVE THE SAME MEAL PLAN? Because every person with diabetes mellitus is different, there is not one meal plan that works for everyone. It is very important that you meet with a dietitian who will help you create a meal plan that is just right for you. Document Released: 08/15/2005 Document Revised: 11/23/2013 Document Reviewed: 10/15/2013 [employer] Patient Information 2015 [employer]. This   information is not intended to replace advice given to you by your health care provider. Make sure you discuss any questions you have with your health care provider. Basic Carbohydrate Counting for Diabetes Mellitus Carbohydrate counting is a method for keeping track of the amount of carbohydrates you eat. Eating  carbohydrates naturally increases the level of sugar (glucose) in your blood, so it is important for you to know the amount that is okay for you to have in every meal. Carbohydrate counting helps keep the level of glucose in your blood within normal limits. The amount of carbohydrates allowed is different for every person. A dietitian can help you calculate the amount that is right for you. Once you know the amount of carbohydrates you can have, you can count the carbohydrates in the foods you want to eat. Carbohydrates are found in the following foods:  Grains, such as breads and cereals.  Dried beans and soy products.  Starchy vegetables, such as potatoes, peas, and corn.  Fruit and fruit juices.  Milk and yogurt.  Sweets and snack foods, such as cake, cookies, candy, chips, soft drinks, and fruit drinks. CARBOHYDRATE COUNTING There are two ways to count the carbohydrates in your food. You can use either of the methods or a combination of both. Reading the "Nutrition Facts" on Packaged Food The "Nutrition Facts" is an area that is included on the labels of almost all packaged food and beverages in the [place]. It includes the serving size of that food or beverage and information about the nutrients in each serving of the food, including the grams (g) of carbohydrate per serving.  Decide the number of servings of this food or beverage that you will be able to eat or drink. Multiply that number of servings by the number of grams of carbohydrate that is listed on the label for that serving. The total will be the amount of carbohydrates you will be having when you eat or drink this food or beverage. Learning Standard Serving Sizes of Food When you eat food that is not packaged or does not include "Nutrition Facts" on the label, you need to measure the servings in order to count the amount of carbohydrates.A serving of most carbohydrate-rich foods contains about 15 g of carbohydrates. The  following list includes serving sizes of carbohydrate-rich foods that provide 15 g ofcarbohydrate per serving:   1 slice of bread (1 oz) or 1 six-inch tortilla.    of a hamburger bun or English muffin.  4-6 crackers.   cup unsweetened dry cereal.    cup hot cereal.   cup rice or pasta.    cup mashed potatoes or  of a large baked potato.  1 cup fresh fruit or one small piece of fruit.    cup canned or frozen fruit or fruit juice.  1 cup milk.   cup plain fat-free yogurt or yogurt sweetened with artificial sweeteners.   cup cooked dried beans or starchy vegetable, such as peas, corn, or potatoes.  Decide the number of standard-size servings that you will eat. Multiply that number of servings by 15 (the grams of carbohydrates in that serving). For example, if you eat 2 cups of strawberries, you will have eaten 2 servings and 30 g of carbohydrates (2 servings x 15 g = 30 g). For foods such as soups and casseroles, in which more than one food is mixed in, you will need to count the carbohydrates in each food that is included. EXAMPLE OF CARBOHYDRATE   COUNTING Sample Dinner  3 oz chicken breast.   cup of brown rice.   cup of corn.  1 cup milk.   1 cup strawberries with sugar-free whipped topping.  Carbohydrate Calculation Step 1: Identify the foods that contain carbohydrates:   Rice.   Corn.   Milk.   Strawberries. Step 2:Calculate the number of servings eaten of each:   2 servings of rice.   1 serving of corn.   1 serving of milk.   1 serving of strawberries. Step 3: Multiply each of those number of servings by 15 g:   2 servings of rice x 15 g = 30 g.   1 serving of corn x 15 g = 15 g.   1 serving of milk x 15 g = 15 g.   1 serving of strawberries x 15 g = 15 g. Step 4: Add together all of the amounts to find the total grams of carbohydrates eaten: 30 g + 15 g + 15 g + 15 g = 75 g. Document Released: 11/18/2005 Document  Revised: 04/04/2014 Document Reviewed: 10/15/2013 Exit[employer] Patient Information 2015 [employer]. This information is not intended to replace advice given to you by your health care provider. Make sure you discuss any questions you have with your health care provider.  

## 2015-02-02 NOTE — Progress Notes (Signed)
Subjective:    Patient ID: Clifford Gibson, male    DOB: 1946-08-02, 69 y.o.   MRN: [id number]  HPI Patient seen for complete physical. His chronic problems include history of obesity, hypertension, hyperlipidemia, GERD. He had recent elevated fasting glucose of 146 but no other fastings in the diabetes range. No prior history of type 2 diabetes. No recent symptoms of polyuria or polydipsia. He is due for repeat colonoscopy. Immunizations are up-to-date. Nonsmoker. No consistent exercise. His weight is relatively stable.  Reviewed with no changes:  Past Medical History  Diagnosis Date  . HYPERLIPIDEMIA 03/28/2010  . OBESITY, MODERATE 04/25/2009  . ERECTILE DYSFUNCTION 04/25/2009  . Acute serous otitis media 02/22/2010  . HYPERTENSION 03/24/2009  . GERD 03/24/2009  . PONV (postoperative nausea and vomiting)   . Arthritis    Past Surgical History  Procedure Laterality Date  . Nasal septum surgery  1974  . Tonsillectomy      3 yrs. old  . Total knee arthroplasty  06/11/2012    Procedure: TOTAL KNEE ARTHROPLASTY;  Surgeon: Javier DockerJeffrey C Beane, MD;  Location: [facility];  Service: Orthopedics;  Laterality: Right;    reports that he has never smoked. He has never used smokeless tobacco. He reports that he does not drink alcohol or use illicit drugs. family history includes Dementia in his father; Scleroderma in his mother. Allergies  Allergen Reactions  . Penicillins Other (See Comments)    Reaction=bleeding,diarrhea      Review of Systems  Constitutional: Negative for fever, activity change, appetite change, fatigue and unexpected weight change.  HENT: Negative for congestion, ear pain and trouble swallowing.   Eyes: Negative for pain and visual disturbance.  Respiratory: Negative for cough, shortness of breath and wheezing.   Cardiovascular: Negative for chest pain and palpitations.  Gastrointestinal: Negative for nausea, vomiting, abdominal pain, diarrhea, constipation, blood in stool,  abdominal distention and rectal pain.  Endocrine: Negative for polydipsia and polyuria.  Genitourinary: Negative for dysuria, hematuria and testicular pain.  Musculoskeletal: Negative for joint swelling and arthralgias.  Skin: Negative for rash.  Neurological: Negative for dizziness, syncope and headaches.  Hematological: Negative for adenopathy.  Psychiatric/Behavioral: Negative for confusion and dysphoric mood.       Objective:   Physical Exam  Constitutional: He is oriented to person, place, and time. He appears well-developed and well-nourished. No distress.  HENT:  Head: Normocephalic and atraumatic.  Right Ear: External ear normal.  Left Ear: External ear normal.  Mouth/Throat: Oropharynx is clear and moist.  Eyes: Conjunctivae and EOM are normal. Pupils are equal, round, and reactive to light.  Neck: Normal range of motion. Neck supple. No thyromegaly present.  Cardiovascular: Normal rate, regular rhythm and normal heart sounds.   No murmur heard. Pulmonary/Chest: No respiratory distress. He has no wheezes. He has no rales.  Abdominal: Soft. Bowel sounds are normal. He exhibits no distension and no mass. There is no tenderness. There is no rebound and no guarding.  Musculoskeletal: He exhibits no edema.  Lymphadenopathy:    He has no cervical adenopathy.  Neurological: He is alert and oriented to person, place, and time. He displays normal reflexes. No cranial nerve deficit.  Skin: No rash noted.  Psychiatric: He has a normal mood and affect.          Assessment & Plan:  Complete physical. Schedule repeat colonoscopy. Labs reviewed. His 10 year risk of CAD event is 21%. We've recommended trial of statin with Lipitor 10 mg daily and  repeat lipid panel in 6-8 weeks.  Hyperglycemia with fasting glucose 146. This is the first known fasting blood sugar over 126. We recommend a follow-up within 6-8 weeks for repeat fasting glucose along with A1c. In the meantime work on  weight loss and establishing more consistent exercise and reduction in sugars and starches. Consider metformin at that point if not further improved

## 2015-02-02 NOTE — Progress Notes (Signed)
Pre visit review using our clinic review tool, if applicable. No additional management support is needed unless otherwise documented below in the visit note. 

## 2015-02-15 ENCOUNTER — Other Ambulatory Visit: Payer: Self-pay | Admitting: Family Medicine

## 2015-02-17 ENCOUNTER — Encounter (INDEPENDENT_AMBULATORY_CARE_PROVIDER_SITE_OTHER): Payer: Self-pay | Admitting: *Deleted

## 2015-02-23 ENCOUNTER — Other Ambulatory Visit (INDEPENDENT_AMBULATORY_CARE_PROVIDER_SITE_OTHER): Payer: Self-pay | Admitting: *Deleted

## 2015-02-23 DIAGNOSIS — Z1211 Encounter for screening for malignant neoplasm of colon: Secondary | ICD-10-CM

## 2015-03-27 ENCOUNTER — Other Ambulatory Visit (INDEPENDENT_AMBULATORY_CARE_PROVIDER_SITE_OTHER): Payer: Medicare Other

## 2015-03-27 DIAGNOSIS — E785 Hyperlipidemia, unspecified: Secondary | ICD-10-CM

## 2015-03-27 DIAGNOSIS — R739 Hyperglycemia, unspecified: Secondary | ICD-10-CM | POA: Diagnosis not present

## 2015-03-27 LAB — BASIC METABOLIC PANEL
BUN: 23 mg/dL (ref 6–23)
CHLORIDE: 103 meq/L (ref 96–112)
CO2: 29 meq/L (ref 19–32)
CREATININE: 1.05 mg/dL (ref 0.40–1.50)
Calcium: 9.3 mg/dL (ref 8.4–10.5)
GFR: 74.16 mL/min (ref >60.00)
GLUCOSE: 127 mg/dL — AB (ref 70–99)
Potassium: 4.3 mEq/L (ref 3.5–5.1)
Sodium: 138 mEq/L (ref 135–145)

## 2015-03-27 LAB — LIPID PANEL
CHOL/HDL RATIO: 4
Cholesterol: 153 mg/dL (ref 0–200)
HDL: 39.5 mg/dL (ref >39.00)
LDL Cholesterol: 90 mg/dL (ref 0–99)
NonHDL: 113.5
TRIGLYCERIDES: 119 mg/dL (ref 0.0–149.0)
VLDL: 23.8 mg/dL (ref 0.0–40.0)

## 2015-03-27 LAB — HEPATIC FUNCTION PANEL
ALK PHOS: 55 U/L (ref 39–117)
ALT: 18 U/L (ref 0–53)
AST: 14 U/L (ref 0–37)
Albumin: 4.4 g/dL (ref 3.5–5.2)
BILIRUBIN TOTAL: 0.5 mg/dL (ref 0.2–1.2)
Bilirubin, Direct: 0.1 mg/dL (ref 0.0–0.3)
TOTAL PROTEIN: 6.8 g/dL (ref 6.0–8.3)

## 2015-03-27 LAB — HEMOGLOBIN A1C: HEMOGLOBIN A1C: 7 % — AB (ref 4.6–6.5)

## 2015-04-03 ENCOUNTER — Encounter: Payer: Self-pay | Admitting: Family Medicine

## 2015-04-03 ENCOUNTER — Ambulatory Visit (INDEPENDENT_AMBULATORY_CARE_PROVIDER_SITE_OTHER): Payer: Medicare Other | Admitting: Family Medicine

## 2015-04-03 VITALS — BP 132/80 | HR 67 | Temp 98.2°F | Wt 224.0 lb

## 2015-04-03 DIAGNOSIS — R739 Hyperglycemia, unspecified: Secondary | ICD-10-CM | POA: Diagnosis not present

## 2015-04-03 DIAGNOSIS — E785 Hyperlipidemia, unspecified: Secondary | ICD-10-CM | POA: Diagnosis not present

## 2015-04-03 NOTE — Progress Notes (Signed)
   Subjective:    Patient ID: Clifford Gibson, male    DOB: [birth year], 69 y.o.   MRN: [id number]  HPI Patient here for follow-up regarding recent diabetes and hyperlipidemia. Refer to previous note. He had fasting blood sugar 146. We also started low-dose statin with Lipitor 10 mg daily. Tolerating well no side effects. He has done an excellent job with dietary change as walking one to 2 miles per day. His weight is down 8 pounds. He feels much better overall. More energy. No polyuria or polydipsia.  Past Medical History  Diagnosis Date  . HYPERLIPIDEMIA 03/28/2010  . OBESITY, MODERATE 04/25/2009  . ERECTILE DYSFUNCTION 04/25/2009  . Acute serous otitis media 02/22/2010  . HYPERTENSION 03/24/2009  . GERD 03/24/2009  . PONV (postoperative nausea and vomiting)   . Arthritis    Past Surgical History  Procedure Laterality Date  . Nasal septum surgery  1974  . Tonsillectomy      3 yrs. old  . Total knee arthroplasty  06/11/2012    Procedure: TOTAL KNEE ARTHROPLASTY;  Surgeon: Javier DockerJeffrey C Beane, MD;  Location: [facility];  Service: Orthopedics;  Laterality: Right;    reports that he has never smoked. He has never used smokeless tobacco. He reports that he does not drink alcohol or use illicit drugs. family history includes Dementia in his father; Scleroderma in his mother. Allergies  Allergen Reactions  . Penicillins Other (See Comments)    Reaction=bleeding,diarrhea      Review of Systems  Constitutional: Negative for fatigue.  Eyes: Negative for visual disturbance.  Respiratory: Negative for cough, chest tightness and shortness of breath.   Cardiovascular: Negative for chest pain, palpitations and leg swelling.  Endocrine: Negative for polydipsia and polyuria.  Neurological: Negative for dizziness, syncope, weakness, light-headedness and headaches.       Objective:   Physical Exam  Constitutional: He appears well-developed and well-nourished.  Cardiovascular: Normal rate and regular  rhythm.   Pulmonary/Chest: Breath sounds normal. No respiratory distress. He has no wheezes. He has no rales.  Musculoskeletal: He exhibits no edema.          Assessment & Plan:  #1 type 2 diabetes. Early onset. Improved and patient has made some great lifestyle changes. A1c 7.0%. Continue weight loss efforts and recheck A1c in 3 months #2 hyperlipidemia improved on low-dose Lipitor.

## 2015-04-03 NOTE — Patient Instructions (Signed)
Continue with weight loss efforts and exercise and let's plan to repeat labs in about 3 months.

## 2015-04-03 NOTE — Progress Notes (Signed)
Pre visit review using our clinic review tool, if applicable. No additional management support is needed unless otherwise documented below in the visit note. 

## 2015-05-02 ENCOUNTER — Other Ambulatory Visit: Payer: Self-pay | Admitting: Family Medicine

## 2015-05-04 ENCOUNTER — Telehealth (INDEPENDENT_AMBULATORY_CARE_PROVIDER_SITE_OTHER): Payer: Self-pay | Admitting: *Deleted

## 2015-05-04 NOTE — Telephone Encounter (Signed)
Patient needs trilyte 

## 2015-05-05 MED ORDER — PEG 3350-KCL-NA BICARB-NACL 420 G PO SOLR: 4000.0000 mL | Freq: Once | ORAL | Status: DC

## 2015-05-16 LAB — HM DIABETES EYE EXAM

## 2015-05-22 ENCOUNTER — Other Ambulatory Visit: Payer: Self-pay | Admitting: Family Medicine

## 2015-05-25 ENCOUNTER — Telehealth (INDEPENDENT_AMBULATORY_CARE_PROVIDER_SITE_OTHER): Payer: Self-pay | Admitting: *Deleted

## 2015-05-25 NOTE — Telephone Encounter (Signed)
Referring MD/PCP: bruce burchette   Procedure: tcs  Reason/Indication:  screening  Has patient had this procedure before?  Yes, 2005 -- epic  If so, when, by whom and where?    Is there a family history of colon cancer?  no  Who?  What age when diagnosed?    Is patient diabetic?   no      Does patient have prosthetic heart valve?  no  Do you have a pacemaker?  no  Has patient ever had endocarditis? no  Has patient had joint replacement within last 12 months?  no  Does patient tend to be constipated or take laxatives? no  Is patient on Coumadin, Plavix and/or Aspirin? yes  Medications: asa 81 mg daily, pantoprazole 40 mg daily, atorvastatin 10 mg daily, valsartan/hctz 160 mg daily  Allergies: pcn  Medication Adjustment: asa 2 days  Procedure date & time: 06/21/15 at 730

## 2015-05-29 NOTE — Telephone Encounter (Signed)
agree

## 2015-06-20 ENCOUNTER — Encounter: Payer: Self-pay | Admitting: Family Medicine

## 2015-06-21 ENCOUNTER — Ambulatory Visit (HOSPITAL_COMMUNITY)
Admission: RE | Admit: 2015-06-21 | Discharge: 2015-06-21 | Disposition: A | Discharge status: Home / Self Care | Payer: Medicare Other | Source: Ambulatory Visit | Attending: Internal Medicine | Admitting: Internal Medicine

## 2015-06-21 ENCOUNTER — Telehealth: Payer: Self-pay | Admitting: *Deleted

## 2015-06-21 ENCOUNTER — Encounter (HOSPITAL_COMMUNITY): Payer: Self-pay

## 2015-06-21 ENCOUNTER — Encounter (HOSPITAL_COMMUNITY)
Admission: RE | Disposition: A | Discharge status: Home / Self Care | Payer: Self-pay | Source: Ambulatory Visit | Attending: Internal Medicine

## 2015-06-21 DIAGNOSIS — Z6831 Body mass index (BMI) 31.0-31.9, adult: Secondary | ICD-10-CM | POA: Diagnosis not present

## 2015-06-21 DIAGNOSIS — K648 Other hemorrhoids: Secondary | ICD-10-CM | POA: Diagnosis not present

## 2015-06-21 DIAGNOSIS — Z1211 Encounter for screening for malignant neoplasm of colon: Secondary | ICD-10-CM | POA: Diagnosis not present

## 2015-06-21 DIAGNOSIS — Z79899 Other long term (current) drug therapy: Secondary | ICD-10-CM | POA: Diagnosis not present

## 2015-06-21 DIAGNOSIS — I10 Essential (primary) hypertension: Secondary | ICD-10-CM | POA: Insufficient documentation

## 2015-06-21 DIAGNOSIS — E668 Other obesity: Secondary | ICD-10-CM | POA: Insufficient documentation

## 2015-06-21 DIAGNOSIS — Z96651 Presence of right artificial knee joint: Secondary | ICD-10-CM | POA: Diagnosis not present

## 2015-06-21 DIAGNOSIS — K644 Residual hemorrhoidal skin tags: Secondary | ICD-10-CM | POA: Insufficient documentation

## 2015-06-21 DIAGNOSIS — E785 Hyperlipidemia, unspecified: Secondary | ICD-10-CM | POA: Diagnosis not present

## 2015-06-21 DIAGNOSIS — N529 Male erectile dysfunction, unspecified: Secondary | ICD-10-CM | POA: Insufficient documentation

## 2015-06-21 DIAGNOSIS — K219 Gastro-esophageal reflux disease without esophagitis: Secondary | ICD-10-CM | POA: Insufficient documentation

## 2015-06-21 DIAGNOSIS — M1990 Unspecified osteoarthritis, unspecified site: Secondary | ICD-10-CM | POA: Diagnosis not present

## 2015-06-21 HISTORY — PX: COLONOSCOPY: SHX5424

## 2015-06-21 SURGERY — COLONOSCOPY
Anesthesia: Moderate Sedation

## 2015-06-21 MED ORDER — MEPERIDINE HCL 50 MG/ML IJ SOLN
INTRAMUSCULAR | Status: DC | PRN
  Administered 2015-06-21 (×2): 25 mg via INTRAVENOUS

## 2015-06-21 MED ORDER — STERILE WATER FOR IRRIGATION IR SOLN
Status: DC | PRN
  Administered 2015-06-21: 07:00:00

## 2015-06-21 MED ORDER — MIDAZOLAM HCL 5 MG/5ML IJ SOLN
INTRAMUSCULAR | Status: AC
  Filled 2015-06-21: qty 10

## 2015-06-21 MED ORDER — MEPERIDINE HCL 50 MG/ML IJ SOLN
INTRAMUSCULAR | Status: AC
  Filled 2015-06-21: qty 1

## 2015-06-21 MED ORDER — SODIUM CHLORIDE 0.9 % IV SOLN
INTRAVENOUS | Status: DC
  Administered 2015-06-21: 07:00:00 via INTRAVENOUS

## 2015-06-21 MED ORDER — SIMETHICONE 40 MG/0.6ML PO SUSP
ORAL | Status: AC
  Filled 2015-06-21: qty 0.6

## 2015-06-21 MED ORDER — MIDAZOLAM HCL 5 MG/5ML IJ SOLN
INTRAMUSCULAR | Status: DC | PRN
  Administered 2015-06-21 (×2): 2 mg via INTRAVENOUS

## 2015-06-21 NOTE — Discharge Instructions (Signed)
Resume usual medications and diet. °No driving for 24 hours. °Next screening exam in 10 years. ° °Colonoscopy, Care After °Refer to this sheet in the next few weeks. These instructions provide you with information on caring for yourself after your procedure. Your health care provider may also give you more specific instructions. Your treatment has been planned according to current medical practices, but problems sometimes occur. Call your health care provider if you have any problems or questions after your procedure. °WHAT TO EXPECT AFTER THE PROCEDURE  °After your procedure, it is typical to have the following: °· A small amount of blood in your stool. °· Moderate amounts of gas and mild abdominal cramping or bloating. °HOME CARE INSTRUCTIONS °· Do not drive, operate machinery, or sign important documents for 24 hours. °· You may shower and resume your regular physical activities, but move at a slower pace for the first 24 hours. °· Take frequent rest periods for the first 24 hours. °· Walk around or put a warm pack on your abdomen to help reduce abdominal cramping and bloating. °· Drink enough fluids to keep your urine clear or pale yellow. °· You may resume your normal diet as instructed by your health care provider. Avoid heavy or fried foods that are hard to digest. °· Avoid drinking alcohol for 24 hours or as instructed by your health care provider. °· Only take over-the-counter or prescription medicines as directed by your health care provider. °· If a tissue sample (biopsy) was taken during your procedure: °¨ Do not take aspirin or blood thinners for 7 days, or as instructed by your health care provider. °¨ Do not drink alcohol for 7 days, or as instructed by your health care provider. °¨ Eat soft foods for the first 24 hours. °SEEK MEDICAL CARE IF: °You have persistent spotting of blood in your stool 2-3 days after the procedure. °SEEK IMMEDIATE MEDICAL CARE IF: °· You have more than a small spotting of  blood in your stool. °· You pass large blood clots in your stool. °· Your abdomen is swollen (distended). °· You have nausea or vomiting. °· You have a fever. °· You have increasing abdominal pain that is not relieved with medicine. ° °

## 2015-06-21 NOTE — Telephone Encounter (Signed)
Open in error

## 2015-06-21 NOTE — H&P (Signed)
Clifford Gibson is an 69 y.o. male.   Chief Complaint: Patient is here for colonoscopy. HPI: This 69 year old Caucasian male who is here for screening colonoscopy. He denies abdominal pain change in bowel habits or rectal bleeding. Last exam was normal in April 2005. Family history is negative for CRC.  Past Medical History  Diagnosis Date  . HYPERLIPIDEMIA 03/28/2010  . OBESITY, MODERATE 04/25/2009  . ERECTILE DYSFUNCTION 04/25/2009  . Acute serous otitis media 02/22/2010  . HYPERTENSION 03/24/2009  . GERD 03/24/2009  . PONV (postoperative nausea and vomiting)   . Arthritis     Past Surgical History  Procedure Laterality Date  . Nasal septum surgery  1974  . Tonsillectomy      3 yrs. old  . Total knee arthroplasty  06/11/2012    Procedure: TOTAL KNEE ARTHROPLASTY;  Surgeon: Clifford DockerJeffrey C Beane, MD;  Location: [facility];  Service: Orthopedics;  Laterality: Right;    Family History  Problem Relation Age of Onset  . Scleroderma Mother   . Dementia Father    Social History:  reports that he has never smoked. He has never used smokeless tobacco. He reports that he does not drink alcohol or use illicit drugs.  Allergies:  Allergies  Allergen Reactions  . Penicillins Other (See Comments)    Reaction=bleeding,diarrhea    Medications Prior to Admission  Medication Sig Dispense Refill  . atorvastatin (LIPITOR) 10 MG tablet Take 1 tablet (10 mg total) by mouth daily. 90 tablet 3  . fluticasone (FLONASE) 50 MCG/ACT nasal spray 1 SPRAY NASALLY ONCE A DAY 48 g 3  . Magnesium Oxide (MAG-OX 400 PO) Take 400 mg by mouth daily with breakfast.    . Omega 3-6-9 Fatty Acids (OMEGA-3 & OMEGA-6 FISH OIL PO) Take 1,200 mg by mouth daily.    . pantoprazole (PROTONIX) 40 MG tablet TAKE 1 TABLET DAILY 60 tablet 5  . polyethylene glycol-electrolytes (NULYTELY/GOLYTELY) 420 G solution Take 4,000 mLs by mouth once. 4000 mL 0  . sildenafil (VIAGRA) 100 MG tablet Take 100 mg by mouth daily as needed. For ED     . valsartan-hydrochlorothiazide (DIOVAN-HCT) 160-12.5 MG per tablet TAKE ONE TABLET AT BEDTIME 90 tablet 1  . meclizine (ANTIVERT) 25 MG tablet Take 25 mg by mouth 3 (three) times daily as needed. For dizziness      No results found for this or any previous visit (from the past 48 hour(s)). No results found.  ROS  Blood pressure 157/81, pulse 60, temperature 98.7 F (37.1 C), temperature source Oral, resp. rate 12, height 5\' 10"  (1.778 m), weight 221 lb (100.245 kg), SpO2 97 %. Physical Exam  Constitutional: He appears well-developed and well-nourished.  HENT:  Mouth/Throat: Oropharynx is clear and moist.  Eyes: Conjunctivae are normal. No scleral icterus.  Neck: No thyromegaly present.  Cardiovascular: Normal rate, regular rhythm and normal heart sounds.   No murmur heard. Respiratory: Effort normal and breath sounds normal.  GI: Soft. He exhibits no distension and no mass. There is no tenderness.  Musculoskeletal: He exhibits no edema.  Lymphadenopathy:    He has no cervical adenopathy.  Neurological: He is alert.  Skin: Skin is warm and dry.     Assessment/Plan Average risk screening colonoscopy.  Clifford Gibson 06/21/2015, 7:29 AM

## 2015-06-21 NOTE — Op Note (Signed)
COLONOSCOPY PROCEDURE REPORT  PATIENT:  Clifford Gibson  MR#:  [id number] Birthdate:  [birth year], 69 y.o., male Endoscopist:  Dr. Malissa HippoNajeeb U. Rehman, MD Referred By:  Dr. Kristian CoveyBruce W Burchette, MD  Procedure Date: 06/21/2015  Procedure:   Colonoscopy  Indications: Patient is 69 year old Caucasian male who is undergoing average risk screening colonoscopy. Last exam was in April 2005.  Informed Consent:  The procedure and risks were reviewed with the patient and informed consent was obtained.  Medications:  Demerol 50 mg IV Versed 4 mg IV  Description of procedure:  After a digital rectal exam was performed, that colonoscope was advanced from the anus through the rectum and colon to the area of the cecum, ileocecal valve and appendiceal orifice. The cecum was deeply intubated. These structures were well-seen and photographed for the record. From the level of the cecum and ileocecal valve, the scope was slowly and cautiously withdrawn. The mucosal surfaces were carefully surveyed utilizing scope tip to flexion to facilitate fold flattening as needed. The scope was pulled down into the rectum where a thorough exam including retroflexion was performed.  Findings:   Prep excellent. Normal mucosa of cecum, ascending colon, hepatic flexure, transverse colon, splenic flexure, descending and sigmoid colon. Normal rectal mucosa. Small hemorrhoids below the dentate line.   Therapeutic/Diagnostic Maneuvers Performed:   None  Complications:  None  Cecal Withdrawal Time:  12 minutes  Impression:  Small external hemorrhoids otherwise normal colonoscopy.  Recommendations:  Standard instructions given. Next screening exam in 10 years.  REHMAN,NAJEEB U  06/21/2015 8:06 AM  CC: Dr. Kristian CoveyBURCHETTE,BRUCE W, MD & Dr. Bonnetta BarryNo ref. provider found

## 2015-06-22 ENCOUNTER — Encounter (HOSPITAL_COMMUNITY): Payer: Self-pay | Admitting: Internal Medicine

## 2015-06-27 ENCOUNTER — Other Ambulatory Visit (INDEPENDENT_AMBULATORY_CARE_PROVIDER_SITE_OTHER): Payer: Medicare Other

## 2015-06-27 DIAGNOSIS — R739 Hyperglycemia, unspecified: Secondary | ICD-10-CM | POA: Diagnosis not present

## 2015-06-27 LAB — HEMOGLOBIN A1C: Hgb A1c MFr Bld: 7.2 % — ABNORMAL HIGH (ref 4.6–6.5)

## 2015-07-04 ENCOUNTER — Ambulatory Visit (INDEPENDENT_AMBULATORY_CARE_PROVIDER_SITE_OTHER): Payer: Medicare Other | Admitting: Family Medicine

## 2015-07-04 ENCOUNTER — Encounter: Payer: Self-pay | Admitting: Family Medicine

## 2015-07-04 VITALS — BP 132/90 | HR 71 | Temp 97.8°F | Wt 224.0 lb

## 2015-07-04 DIAGNOSIS — E119 Type 2 diabetes mellitus without complications: Secondary | ICD-10-CM

## 2015-07-04 NOTE — Patient Instructions (Signed)
Lose some weight Establish more regular exercise We will call you regarding diabetes educator.    Basic Carbohydrate Counting for Diabetes Mellitus Carbohydrate counting is a method for keeping track of the amount of carbohydrates you eat. Eating carbohydrates naturally increases the level of sugar (glucose) in your blood, so it is important for you to know the amount that is okay for you to have in every meal. Carbohydrate counting helps keep the level of glucose in your blood within normal limits. The amount of carbohydrates allowed is different for every person. A dietitian can help you calculate the amount that is right for you. Once you know the amount of carbohydrates you can have, you can count the carbohydrates in the foods you want to eat. Carbohydrates are found in the following foods:  Grains, such as breads and cereals.  Dried beans and soy products.  Starchy vegetables, such as potatoes, peas, and corn.  Fruit and fruit juices.  Milk and yogurt.  Sweets and snack foods, such as cake, cookies, candy, chips, soft drinks, and fruit drinks. CARBOHYDRATE COUNTING There are two ways to count the carbohydrates in your food. You can use either of the methods or a combination of both. Reading the "Nutrition Facts" on Packaged Food The "Nutrition Facts" is an area that is included on the labels of almost all packaged food and beverages in the [place]. It includes the serving size of that food or beverage and information about the nutrients in each serving of the food, including the grams (g) of carbohydrate per serving.  Decide the number of servings of this food or beverage that you will be able to eat or drink. Multiply that number of servings by the number of grams of carbohydrate that is listed on the label for that serving. The total will be the amount of carbohydrates you will be having when you eat or drink this food or beverage. Learning Standard Serving Sizes of Food When  you eat food that is not packaged or does not include "Nutrition Facts" on the label, you need to measure the servings in order to count the amount of carbohydrates.A serving of most carbohydrate-rich foods contains about 15 g of carbohydrates. The following list includes serving sizes of carbohydrate-rich foods that provide 15 g ofcarbohydrate per serving:   1 slice of bread (1 oz) or 1 six-inch tortilla.    of a hamburger bun or English muffin.  4-6 crackers.   cup unsweetened dry cereal.    cup hot cereal.   cup rice or pasta.    cup mashed potatoes or  of a large baked potato.  1 cup fresh fruit or one small piece of fruit.    cup canned or frozen fruit or fruit juice.  1 cup milk.   cup plain fat-free yogurt or yogurt sweetened with artificial sweeteners.   cup cooked dried beans or starchy vegetable, such as peas, corn, or potatoes.  Decide the number of standard-size servings that you will eat. Multiply that number of servings by 15 (the grams of carbohydrates in that serving). For example, if you eat 2 cups of strawberries, you will have eaten 2 servings and 30 g of carbohydrates (2 servings x 15 g = 30 g). For foods such as soups and casseroles, in which more than one food is mixed in, you will need to count the carbohydrates in each food that is included. EXAMPLE OF CARBOHYDRATE COUNTING Sample Dinner  3 oz chicken breast.   cup of  brown rice.   cup of corn.  1 cup milk.   1 cup strawberries with sugar-free whipped topping.  Carbohydrate Calculation Step 1: Identify the foods that contain carbohydrates:   Rice.   Corn.   Milk.   Strawberries. Step 2:Calculate the number of servings eaten of each:   2 servings of rice.   1 serving of corn.   1 serving of milk.   1 serving of strawberries. Step 3: Multiply each of those number of servings by 15 g:   2 servings of rice x 15 g = 30 g.   1 serving of corn x 15 g = 15 g.    1 serving of milk x 15 g = 15 g.   1 serving of strawberries x 15 g = 15 g. Step 4: Add together all of the amounts to find the total grams of carbohydrates eaten: 30 g + 15 g + 15 g + 15 g = 75 g. Document Released: 11/18/2005 Document Revised: 04/04/2014 Document Reviewed: 10/15/2013 [facility] Patient Information 2015 [employer], [place]. This information is not intended to replace advice given to you by your health care provider. Make sure you discuss any questions you have with your health care provider. Diabetes Mellitus and Food It is important for you to manage your blood sugar (glucose) level. Your blood glucose level can be greatly affected by what you eat. Eating healthier foods in the appropriate amounts throughout the day at about the same time each day will help you control your blood glucose level. It can also help slow or prevent worsening of your diabetes mellitus. Healthy eating may even help you improve the level of your blood pressure and reach or maintain a healthy weight.  HOW CAN FOOD AFFECT ME? Carbohydrates Carbohydrates affect your blood glucose level more than any other type of food. Your dietitian will help you determine how many carbohydrates to eat at each meal and teach you how to count carbohydrates. Counting carbohydrates is important to keep your blood glucose at a healthy level, especially if you are using insulin or taking certain medicines for diabetes mellitus. Alcohol Alcohol can cause sudden decreases in blood glucose (hypoglycemia), especially if you use insulin or take certain medicines for diabetes mellitus. Hypoglycemia can be a life-threatening condition. Symptoms of hypoglycemia (sleepiness, dizziness, and disorientation) are similar to symptoms of having too much alcohol.  If your health care provider has given you approval to drink alcohol, do so in moderation and use the following guidelines:  Women should not have more than one drink per day, and men  should not have more than two drinks per day. One drink is equal to:  12 oz of beer.  5 oz of wine.  1 oz of hard liquor.  Do not drink on an empty stomach.  Keep yourself hydrated. Have water, diet soda, or unsweetened iced tea.  Regular soda, juice, and other mixers might contain a lot of carbohydrates and should be counted. WHAT FOODS ARE NOT RECOMMENDED? As you make food choices, it is important to remember that all foods are not the same. Some foods have fewer nutrients per serving than other foods, even though they might have the same number of calories or carbohydrates. It is difficult to get your body what it needs when you eat foods with fewer nutrients. Examples of foods that you should avoid that are high in calories and carbohydrates but low in nutrients include:  Trans fats (most processed foods list trans fats on  the Nutrition Facts label).  Regular soda.  Juice.  Candy.  Sweets, such as cake, pie, doughnuts, and cookies.  Fried foods. WHAT FOODS CAN I EAT? Have nutrient-rich foods, which will nourish your body and keep you healthy. The food you should eat also will depend on several factors, including:  The calories you need.  The medicines you take.  Your weight.  Your blood glucose level.  Your blood pressure level.  Your cholesterol level. You also should eat a variety of foods, including:  Protein, such as meat, poultry, fish, tofu, nuts, and seeds (lean animal proteins are best).  Fruits.  Vegetables.  Dairy products, such as milk, cheese, and yogurt (low fat is best).  Breads, grains, pasta, cereal, rice, and beans.  Fats such as olive oil, trans fat-free margarine, canola oil, avocado, and olives. DOES EVERYONE WITH DIABETES MELLITUS HAVE THE SAME MEAL PLAN? Because every person with diabetes mellitus is different, there is not one meal plan that works for everyone. It is very important that you meet with a dietitian who will help you  create a meal plan that is just right for you. Document Released: 08/15/2005 Document Revised: 11/23/2013 Document Reviewed: 10/15/2013 [facility] Patient Information 2015 [place], [place]. This information is not intended to replace advice given to you by your health care provider. Make sure you discuss any questions you have with your health care provider.

## 2015-07-04 NOTE — Progress Notes (Signed)
   Subjective:    Patient ID: Clifford Gibson, male    DOB: 05-15-1946, 69 y.o.   MRN: [id number]  HPI Here for follow-up recently diagnosed type 2 diabetes. He elected not to start medication with metformin. His weight is unchanged from 3 months ago. A1c 7.2%. No symptoms of polyuria or polydipsia. He just joined [employer] but has not yet started with exercise program. He tends to eat a lot of fruits but does not eat out a lot. Has reduced intake of sugar through beverages such as juice. Other medications reviewed and compliant with all  Past Medical History  Diagnosis Date  . HYPERLIPIDEMIA 03/28/2010  . OBESITY, MODERATE 04/25/2009  . ERECTILE DYSFUNCTION 04/25/2009  . Acute serous otitis media 02/22/2010  . HYPERTENSION 03/24/2009  . GERD 03/24/2009  . PONV (postoperative nausea and vomiting)   . Arthritis    Past Surgical History  Procedure Laterality Date  . Nasal septum surgery  1974  . Tonsillectomy      3 yrs. old  . Total knee arthroplasty  06/11/2012    Procedure: TOTAL KNEE ARTHROPLASTY;  Surgeon: Javier Docker, MD;  Location: [facility];  Service: Orthopedics;  Laterality: Right;  . Colonoscopy N/A 06/21/2015    Procedure: COLONOSCOPY;  Surgeon: Malissa Hippo, MD;  [facility];  Service: Endoscopy;  Laterality: N/A;  930 - moved to 7/20 @ 7:30    reports that he has never smoked. He has never used smokeless tobacco. He reports that he does not drink alcohol or use illicit drugs. family history includes Dementia in his father; Scleroderma in his mother. Allergies  Allergen Reactions  . Penicillins Other (See Comments)    Reaction=bleeding,diarrhea      Review of Systems  Constitutional: Negative for fatigue.  Eyes: Negative for visual disturbance.  Respiratory: Negative for cough, chest tightness and shortness of breath.   Cardiovascular: Negative for chest pain, palpitations and leg swelling.  Endocrine: Negative for polydipsia and polyuria.  Neurological:  Negative for dizziness, syncope, weakness, light-headedness and headaches.       Objective:   Physical Exam  Constitutional: He is oriented to person, place, and time. He appears well-developed and well-nourished.  HENT:  Right Ear: External ear normal.  Left Ear: External ear normal.  Mouth/Throat: Oropharynx is clear and moist.  Eyes: Pupils are equal, round, and reactive to light.  Neck: Neck supple. No thyromegaly present.  Cardiovascular: Normal rate and regular rhythm.   Pulmonary/Chest: Effort normal and breath sounds normal. No respiratory distress. He has no wheezes. He has no rales.  Musculoskeletal: He exhibits no edema.  Neurological: He is alert and oriented to person, place, and time.          Assessment & Plan:  Type 2 diabetes. Recently diagnosed. A1c 7.2%. We discussed option of starting metformin versus lifestyle modification and he prefers to work on weight loss, establish more consistent exercise and recheck in 3 months. If not below 7 at that point will recommend metformin. Set up to see diabetes educator.

## 2015-07-04 NOTE — Progress Notes (Signed)
Pre visit review using our clinic review tool, if applicable. No additional management support is needed unless otherwise documented below in the visit note. 

## 2015-10-04 ENCOUNTER — Ambulatory Visit: Payer: Medicare Other | Admitting: Family Medicine

## 2015-10-11 ENCOUNTER — Ambulatory Visit: Payer: Medicare Other | Admitting: Family Medicine

## 2015-10-24 ENCOUNTER — Ambulatory Visit: Payer: Medicare Other

## 2015-10-31 ENCOUNTER — Ambulatory Visit: Payer: Medicare Other

## 2015-11-02 ENCOUNTER — Encounter: Payer: Medicare Other | Attending: Family Medicine

## 2015-11-02 VITALS — Ht 70.0 in | Wt 228.9 lb

## 2015-11-02 DIAGNOSIS — E119 Type 2 diabetes mellitus without complications: Secondary | ICD-10-CM | POA: Insufficient documentation

## 2015-11-02 NOTE — Progress Notes (Signed)
Patient was seen on 11/02/15 for the first of a series of three diabetes self-management courses at [facility].  Patient Education Plan per assessed needs and concerns is to attend four course education program for Diabetes Self Management Education.  The following learning objectives were met by the patient during this class:  Describe diabetes  State some common risk factors for diabetes  Defines the role of glucose and insulin  Identifies type of diabetes and pathophysiology  Describe the relationship between diabetes and cardiovascular risk  State the members of the Healthcare Team  States the rationale for glucose monitoring  State when to test glucose  State their individual Target Range  State the importance of logging glucose readings  Describe how to interpret glucose readings  Identifies A1C target  Explain the correlation between A1c and eAG values  State symptoms and treatment of high blood glucose  State symptoms and treatment of low blood glucose  Explain proper technique for glucose testing  Identifies proper sharps disposal  Handouts given during class include:  Living Well with Diabetes book  Carb Counting and Meal Planning book  Meal Plan Card  Carbohydrate guide  Meal planning worksheet  Low Sodium Flavoring Tips  The diabetes portion plate  E5U to eAG Conversion Chart  Diabetes Medications  Diabetes Recommended Care Schedule  Support Group  Diabetes Success Plan  Core Class Satisfaction Survey  Follow-Up Plan:  Attend core 2

## 2015-11-07 ENCOUNTER — Ambulatory Visit: Payer: Medicare Other

## 2015-11-09 DIAGNOSIS — E119 Type 2 diabetes mellitus without complications: Secondary | ICD-10-CM

## 2015-11-09 NOTE — Progress Notes (Signed)

## 2015-11-13 ENCOUNTER — Ambulatory Visit (INDEPENDENT_AMBULATORY_CARE_PROVIDER_SITE_OTHER): Payer: Medicare Other | Admitting: Family Medicine

## 2015-11-13 ENCOUNTER — Encounter: Payer: Self-pay | Admitting: Family Medicine

## 2015-11-13 VITALS — BP 120/90 | HR 88 | Temp 98.2°F | Resp 14 | Ht 70.0 in | Wt 224.5 lb

## 2015-11-13 DIAGNOSIS — E119 Type 2 diabetes mellitus without complications: Secondary | ICD-10-CM

## 2015-11-13 LAB — HEMOGLOBIN A1C: HEMOGLOBIN A1C: 7.4 % — AB (ref 4.6–6.5)

## 2015-11-13 NOTE — Progress Notes (Signed)
   Subjective:    Patient ID: Clifford Gibson, male    DOB: 10-31-1946, 69 y.o.   MRN: [id number]  HPI Follow-up type 2 diabetes Patient elected not to go on medication. Last A1c 7.2%. He has met with diabetes educator several times. He's made some lifestyle changes and exercising about 3 days per week. Unfortunately, his weight is about the same Not monitoring blood sugars regularly.  Other medications reviewed and compliant with all. No recent chest pains, dizziness, or dyspnea.  Past Medical History  Diagnosis Date  . HYPERLIPIDEMIA 03/28/2010  . OBESITY, MODERATE 04/25/2009  . ERECTILE DYSFUNCTION 04/25/2009  . Acute serous otitis media 02/22/2010  . HYPERTENSION 03/24/2009  . GERD 03/24/2009  . PONV (postoperative nausea and vomiting)   . Arthritis   . Diabetes mellitus without complication [facility])    Past Surgical History  Procedure Laterality Date  . Nasal septum surgery  1974  . Tonsillectomy      3 yrs. old  . Total knee arthroplasty  06/11/2012    Procedure: TOTAL KNEE ARTHROPLASTY;  Surgeon: Johnn Hai, MD;  Location: [facility];  Service: Orthopedics;  Laterality: Right;  . Colonoscopy N/A 06/21/2015    Procedure: COLONOSCOPY;  Surgeon: Rogene Houston, MD;  [facility];  Service: Endoscopy;  Laterality: N/A;  930 - moved to 7/20 @ 7:30    reports that he has never smoked. He has never used smokeless tobacco. He reports that he does not drink alcohol or use illicit drugs. family history includes Dementia in his father; Scleroderma in his mother. Allergies  Allergen Reactions  . Penicillins Other (See Comments)    Reaction=bleeding,diarrhea      Review of Systems  Constitutional: Negative for fatigue.  Eyes: Negative for visual disturbance.  Respiratory: Negative for cough, chest tightness and shortness of breath.   Cardiovascular: Negative for chest pain, palpitations and leg swelling.  Endocrine: Negative for polydipsia and polyuria.    Neurological: Negative for dizziness, syncope, weakness, light-headedness and headaches.       Objective:   Physical Exam  Constitutional: He appears well-developed and well-nourished.  Neck: Neck supple. No thyromegaly present.  Cardiovascular: Normal rate and regular rhythm.  Exam reveals no gallop.   Pulmonary/Chest: Effort normal and breath sounds normal. No respiratory distress. He has no wheezes. He has no rales.  Musculoskeletal: He exhibits no edema.          Assessment & Plan:  Type 2 diabetes. Currently not treated with medication. Recheck A1c. If not improving to goal further add metformin 500 mg twice a day. We discussed diet and exercise. Routine follow-up 6 months

## 2015-11-14 MED ORDER — METFORMIN HCL 500 MG PO TABS: 500.0000 mg | ORAL_TABLET | Freq: Two times a day (BID) | ORAL | Status: DC

## 2015-11-14 NOTE — Addendum Note (Signed)
Addended by: Tempie HoistMCNEIL, Melchizedek Espinola M on: 11/14/2015 11:14 AM   Modules accepted: Orders

## 2015-11-21 ENCOUNTER — Telehealth: Payer: Self-pay | Admitting: Family Medicine

## 2015-11-21 NOTE — Telephone Encounter (Signed)
Mr. Clifford Gibson received the letter stating we've been trying to contact him to give him lab results. He'd like a phone call regarding this and if Dr. Caryl NeverBurchette wants him to begin taking Metformin.   Pt's ph# [phone] Thank you.

## 2015-11-21 NOTE — Progress Notes (Signed)
Patient was seen on 11/16/15 for the third of a series of three diabetes self-management courses at the [facility].   Clifford Gibson. State the amount of activity recommended for healthy living . Describe activities suitable for individual needs . Identify ways to regularly incorporate activity into daily life . Identify barriers to activity and ways to over come these barriers  Identify diabetes medications being personally used and their primary action for lowering glucose and possible side effects . Describe role of stress on blood glucose and develop strategies to address psychosocial issues . Identify diabetes complications and ways to prevent them  Explain how to manage diabetes during illness . Evaluate success in meeting personal goal  Goals:   I will count my carb choices at most meals and snacks  I will be active  I will take my diabetes medications as scheduled  Your patient has identified these potential barriers to change:  Motivation  Your patient has identified their diabetes self-care support plan as  Family Support Plan:  Attend Optional Core 4 in 4 months

## 2015-11-21 NOTE — Telephone Encounter (Signed)
Pt is aware of results. He will started medication. Follow-up scheduled for February 21, 2016.

## 2015-12-13 ENCOUNTER — Other Ambulatory Visit: Payer: Self-pay | Admitting: Family Medicine

## 2016-01-30 ENCOUNTER — Other Ambulatory Visit: Payer: Self-pay

## 2016-01-30 MED ORDER — PANTOPRAZOLE SODIUM 40 MG PO TBEC: 40.0000 mg | DELAYED_RELEASE_TABLET | Freq: Every day | ORAL | Status: DC

## 2016-01-30 MED ORDER — METFORMIN HCL 500 MG PO TABS: 500.0000 mg | ORAL_TABLET | Freq: Two times a day (BID) | ORAL | Status: DC

## 2016-02-05 ENCOUNTER — Other Ambulatory Visit: Payer: Self-pay | Admitting: *Deleted

## 2016-02-05 MED ORDER — ATORVASTATIN CALCIUM 10 MG PO TABS: 10.0000 mg | ORAL_TABLET | Freq: Every day | ORAL | Status: DC

## 2016-02-21 ENCOUNTER — Encounter: Payer: Self-pay | Admitting: Family Medicine

## 2016-02-21 ENCOUNTER — Ambulatory Visit (INDEPENDENT_AMBULATORY_CARE_PROVIDER_SITE_OTHER): Payer: Medicare Other | Admitting: Family Medicine

## 2016-02-21 VITALS — BP 122/90 | HR 86 | Temp 97.9°F | Ht 70.0 in | Wt 222.4 lb

## 2016-02-21 DIAGNOSIS — I10 Essential (primary) hypertension: Secondary | ICD-10-CM

## 2016-02-21 DIAGNOSIS — E119 Type 2 diabetes mellitus without complications: Secondary | ICD-10-CM

## 2016-02-21 DIAGNOSIS — R739 Hyperglycemia, unspecified: Secondary | ICD-10-CM

## 2016-02-21 LAB — LIPID PANEL
CHOLESTEROL: 148 mg/dL (ref 0–200)
HDL: 38.3 mg/dL — ABNORMAL LOW (ref >39.00)
LDL Cholesterol: 81 mg/dL (ref 0–99)
NONHDL: 110.19
Total CHOL/HDL Ratio: 4
Triglycerides: 148 mg/dL (ref 0.0–149.0)
VLDL: 29.6 mg/dL (ref 0.0–40.0)

## 2016-02-21 LAB — HEPATIC FUNCTION PANEL
ALT: 16 U/L (ref 0–53)
AST: 14 U/L (ref 0–37)
Albumin: 4.6 g/dL (ref 3.5–5.2)
Alkaline Phosphatase: 48 U/L (ref 39–117)
Bilirubin, Direct: 0.1 mg/dL (ref 0.0–0.3)
Total Bilirubin: 0.7 mg/dL (ref 0.2–1.2)
Total Protein: 6.8 g/dL (ref 6.0–8.3)

## 2016-02-21 LAB — BASIC METABOLIC PANEL
BUN: 26 mg/dL — AB (ref 6–23)
CHLORIDE: 103 meq/L (ref 96–112)
CO2: 27 meq/L (ref 19–32)
Calcium: 9.7 mg/dL (ref 8.4–10.5)
Creatinine, Ser: 1.21 mg/dL (ref 0.40–1.50)
GFR: 62.8 mL/min (ref >60.00)
GLUCOSE: 127 mg/dL — AB (ref 70–99)
POTASSIUM: 4.1 meq/L (ref 3.5–5.1)
Sodium: 140 mEq/L (ref 135–145)

## 2016-02-21 LAB — HEMOGLOBIN A1C: HEMOGLOBIN A1C: 7 % — AB (ref 4.6–6.5)

## 2016-02-21 LAB — HM DIABETES FOOT EXAM: HM Diabetic Foot Exam: NORMAL

## 2016-02-21 MED ORDER — GLUCOSE BLOOD VI STRP: ORAL_STRIP | Status: DC

## 2016-02-21 MED ORDER — ACCU-CHEK FASTCLIX LANCETS MISC: Status: DC

## 2016-02-21 NOTE — Progress Notes (Signed)
   Subjective:    Patient ID: Clifford Gibson, male    DOB: 14-Jun-1946, 70 y.o.   MRN: [id number]  HPI  Patient seen for medical follow-up  Type 2 diabetes. Diagnosed several months ago. Most recent A1c 7.4%. Takes metformin 500 mg twice daily which we started in December. No side effects. Does not have home glucose monitor. He completed 3 sessions with diabetes educator. His weight is down 2 pounds. He is exercising but has been limited some arthritis in his right ankle. No recent chest pains.  Hypertension treated with valsartan HCTZ. He has hyperlipidemia treated with atorvastatin. He's been on this for quite some time.  He gets regular eye exams every June.  Past Medical History  Diagnosis Date  . HYPERLIPIDEMIA 03/28/2010  . OBESITY, MODERATE 04/25/2009  . ERECTILE DYSFUNCTION 04/25/2009  . Acute serous otitis media 02/22/2010  . HYPERTENSION 03/24/2009  . GERD 03/24/2009  . PONV (postoperative nausea and vomiting)   . Arthritis   . Diabetes mellitus without complication [facility](HCC)    Past Surgical History  Procedure Laterality Date  . Nasal septum surgery  1974  . Tonsillectomy      3 yrs. old  . Total knee arthroplasty  06/11/2012    Procedure: TOTAL KNEE ARTHROPLASTY;  Surgeon: Javier DockerJeffrey C Beane, MD;  Location: [facility];  Service: Orthopedics;  Laterality: Right;  . Colonoscopy N/A 06/21/2015    Procedure: COLONOSCOPY;  Surgeon: Malissa HippoNajeeb U Rehman, MD;  Location: [facility];  Service: Endoscopy;  Laterality: N/A;  930 - moved to 7/20 @ 7:30    reports that he has never smoked. He has never used smokeless tobacco. He reports that he does not drink alcohol or use illicit drugs. family history includes Dementia in his father; Scleroderma in his mother. Allergies  Allergen Reactions  . Penicillins Other (See Comments)    Reaction=bleeding,diarrhea     Review of Systems  Constitutional: Negative for fatigue.  Eyes: Negative for visual disturbance.  Respiratory: Negative for cough,  chest tightness and shortness of breath.   Cardiovascular: Negative for chest pain, palpitations and leg swelling.  Endocrine: Negative for polydipsia and polyuria.  Musculoskeletal: Positive for arthralgias.  Neurological: Negative for dizziness, syncope, weakness, light-headedness and headaches.       Objective:   Physical Exam  Constitutional: He appears well-developed and well-nourished.  Neck: Neck supple. No thyromegaly present.  Cardiovascular: Normal rate and regular rhythm.   Pulmonary/Chest: Effort normal and breath sounds normal. No respiratory distress. He has no wheezes. He has no rales.  Musculoskeletal: He exhibits no edema.  Skin:  Feet reveal no skin lesions. Good distal foot pulses. Good capillary refill. No calluses. Normal sensation with monofilament testing           Assessment & Plan:  #1 type 2 diabetes. Recently diagnosed. Recent addition of metformin. Home glucose monitor given. Continue weight loss efforts. Recheck A1c. Reminder for yearly eye exam  #2 hypertension. Stable by home readings. Continue valsartan HCTZ. Check basic metabolic panel.  #3 hyperlipidemia. Check lipid and hepatic panel. Continue Lipitor.

## 2016-02-21 NOTE — Progress Notes (Signed)
Pre visit review using our clinic review tool, if applicable. No additional management support is needed unless otherwise documented below in the visit note. 

## 2016-02-21 NOTE — Patient Instructions (Signed)
Continue weight loss efforts Consider monitor blood sugars at least weekly Goal fasting blood sugar is less than 130. Left plan follow-up in 6 months to reassess Continue with yearly eye exam

## 2016-02-21 NOTE — Addendum Note (Signed)
Addended by: Kern ReapVEREEN, Amarys Sliwinski B on: 02/21/2016 08:54 AM   Modules accepted: Orders

## 2016-05-13 ENCOUNTER — Ambulatory Visit: Payer: Medicare Other | Admitting: Family Medicine

## 2016-06-05 ENCOUNTER — Other Ambulatory Visit: Payer: Self-pay | Admitting: Family Medicine

## 2016-06-05 NOTE — Telephone Encounter (Signed)
Rx refill sent to pharmacy. 

## 2016-07-08 LAB — HM COLONOSCOPY

## 2016-07-11 LAB — HM DIABETES EYE EXAM

## 2016-07-15 ENCOUNTER — Encounter: Payer: Self-pay | Admitting: Family Medicine

## 2016-07-24 ENCOUNTER — Encounter: Payer: Self-pay | Admitting: Family Medicine

## 2016-07-26 ENCOUNTER — Other Ambulatory Visit: Payer: Self-pay | Admitting: Family Medicine

## 2016-08-13 ENCOUNTER — Other Ambulatory Visit: Payer: Self-pay | Admitting: Family Medicine

## 2016-09-03 ENCOUNTER — Encounter: Payer: Self-pay | Admitting: Family Medicine

## 2016-09-09 ENCOUNTER — Encounter: Payer: Self-pay | Admitting: Family Medicine

## 2016-09-09 ENCOUNTER — Ambulatory Visit (INDEPENDENT_AMBULATORY_CARE_PROVIDER_SITE_OTHER): Payer: Medicare Other | Admitting: Family Medicine

## 2016-09-09 VITALS — BP 138/80 | HR 78 | Temp 98.2°F | Resp 20 | Ht 70.0 in | Wt 218.0 lb

## 2016-09-09 DIAGNOSIS — I10 Essential (primary) hypertension: Secondary | ICD-10-CM | POA: Diagnosis not present

## 2016-09-09 DIAGNOSIS — K219 Gastro-esophageal reflux disease without esophagitis: Secondary | ICD-10-CM

## 2016-09-09 DIAGNOSIS — E119 Type 2 diabetes mellitus without complications: Secondary | ICD-10-CM | POA: Diagnosis not present

## 2016-09-09 DIAGNOSIS — E785 Hyperlipidemia, unspecified: Secondary | ICD-10-CM | POA: Diagnosis not present

## 2016-09-09 LAB — POCT GLYCOSYLATED HEMOGLOBIN (HGB A1C): Hemoglobin A1C: 6.7

## 2016-09-09 NOTE — Progress Notes (Signed)
Subjective:     Patient ID: Clifford Gibson, male   DOB: Jan 02, 1946, 70 y.o.   MRN: [id number]  HPI Patient here for medical follow-up. He has history of hypertension, hyperlipidemia, type 2 diabetes. Last A1c 7.0%. Fasting blood sugars have been stable. He remains on metformin. No polyuria or polydipsia. Blood pressures very well controlled by home readings. Blood pressure this morning at home 122/78. No dizziness. No chest pains.  History of GERD. Controlled on Protonix. He has questions about tapering off. He has occasionally taken Zantac in place of this and has done well.  Past Medical History:  Diagnosis Date  . Acute serous otitis media 02/22/2010  . Arthritis   . Diabetes mellitus without complication (HCC)   . ERECTILE DYSFUNCTION 04/25/2009  . GERD 03/24/2009  . HYPERLIPIDEMIA 03/28/2010  . HYPERTENSION 03/24/2009  . OBESITY, MODERATE 04/25/2009  . PONV (postoperative nausea and vomiting)    Past Surgical History:  Procedure Laterality Date  . COLONOSCOPY N/A 06/21/2015   Procedure: COLONOSCOPY;  Surgeon: Malissa HippoNajeeb U Rehman, MD;  Location: [facility];  Service: Endoscopy;  Laterality: N/A;  930 - moved to 7/20 @ 7:30  . NASAL SEPTUM SURGERY  1974  . TONSILLECTOMY     3 yrs. old  . TOTAL KNEE ARTHROPLASTY  06/11/2012   Procedure: TOTAL KNEE ARTHROPLASTY;  Surgeon: Javier DockerJeffrey C Beane, MD;  Location: [facility];  Service: Orthopedics;  Laterality: Right;    reports that he has never smoked. He has never used smokeless tobacco. He reports that he does not drink alcohol or use drugs. family history includes Dementia in his father; Scleroderma in his mother. Allergies  Allergen Reactions  . Penicillins Other (See Comments)    Reaction=bleeding,diarrhea     Review of Systems  Constitutional: Negative for fatigue.  Eyes: Negative for visual disturbance.  Respiratory: Negative for cough, chest tightness and shortness of breath.   Cardiovascular: Negative for chest pain, palpitations and  leg swelling.  Endocrine: Negative for polydipsia and polyuria.  Neurological: Negative for dizziness, syncope, weakness, light-headedness and headaches.       Objective:   Physical Exam  Constitutional: He is oriented to person, place, and time. He appears well-developed and well-nourished.  HENT:  Right Ear: External ear normal.  Left Ear: External ear normal.  Mouth/Throat: Oropharynx is clear and moist.  Eyes: Pupils are equal, round, and reactive to light.  Neck: Neck supple. No thyromegaly present.  Cardiovascular: Normal rate and regular rhythm.   Pulmonary/Chest: Effort normal and breath sounds normal. No respiratory distress. He has no wheezes. He has no rales.  Musculoskeletal: He exhibits no edema.  Neurological: He is alert and oriented to person, place, and time.       Assessment:     #1 hypertension stable  #2 type 2 diabetes recently diagnosed  #3 dyslipidemia  #4 GERD    Plan:     -Try tapering off Protonix and transition to Zantac or Pepcid as needed -Check lab today with hemoglobin A1c -Flu vaccine offered but he had some recent cough and wishes to defer  Kristian CoveyBruce W Riely Baskett MD [facility]

## 2016-09-09 NOTE — Patient Instructions (Signed)
Try tapering off Protonix Consider Zantac 150 mg once or twice daily as needed

## 2016-09-09 NOTE — Progress Notes (Signed)
Pre visit review using our clinic review tool, if applicable. No additional management support is needed unless otherwise documented below in the visit note. 

## 2016-09-11 ENCOUNTER — Telehealth: Payer: Self-pay

## 2016-09-11 NOTE — Telephone Encounter (Signed)
Call to Clifford Gibson and left message for her and spouse that it was going to be necessary to reschedule her apt for Friday for AWV.  Request call back to the [place] office to reschedule  

## 2016-09-12 NOTE — Telephone Encounter (Signed)
Call back this am for rescheduling AWV Rescheduled for 10/24 at 1:30 and 2:15 for the patient and spouse.  

## 2016-09-13 ENCOUNTER — Ambulatory Visit: Payer: Medicare Other

## 2016-09-24 ENCOUNTER — Ambulatory Visit (INDEPENDENT_AMBULATORY_CARE_PROVIDER_SITE_OTHER): Payer: Medicare Other

## 2016-09-24 VITALS — BP 118/70 | HR 90 | Ht 70.0 in | Wt 221.2 lb

## 2016-09-24 DIAGNOSIS — Z0000 Encounter for general adult medical examination without abnormal findings: Secondary | ICD-10-CM

## 2016-09-24 DIAGNOSIS — Z23 Encounter for immunization: Secondary | ICD-10-CM | POA: Diagnosis not present

## 2016-09-24 DIAGNOSIS — Z7289 Other problems related to lifestyle: Secondary | ICD-10-CM

## 2016-09-24 NOTE — Patient Instructions (Addendum)
Clifford Gibson , Thank you for taking time to come for your Medicare Wellness Visit. I appreciate your ongoing commitment to your health goals. Please review the following plan we discussed and let me know if I can assist you in the future.   Required Immunizations needed today Will take high dose flu shot  Has had eye exam   These are the goals we discussed: Goals    . Exercise 150 minutes per week (moderate activity)          Will start an exercising; walking plan together at nearby chruch; safe area        This is a list of the screening recommended for you and due dates:  Health Maintenance  Topic Date Due  .  Hepatitis C: One time screening is recommended by Center for Disease Control  (CDC) for  adults born from [birth year] through 1965.   1945/12/29  . Flu Shot  10/10/2016*  . Complete foot exam   02/20/2017  . Hemoglobin A1C  03/10/2017  . Eye exam for diabetics  07/11/2017  . Tetanus Vaccine  01/06/2018  . Colon Cancer Screening  07/08/2026  . Shingles Vaccine  Addressed  . Pneumonia vaccines  Completed  *Topic was postponed. The date shown is not the original due date.        Fall Prevention in the Home  Falls can cause injuries. They can happen to people of all ages. There are many things you can do to make your home safe and to help prevent falls.  WHAT CAN I DO ON THE OUTSIDE OF MY HOME?  Regularly fix the edges of walkways and driveways and fix any cracks.  Remove anything that might make you trip as you walk through a door, such as a raised step or threshold.  Trim any bushes or trees on the path to your home.  Use bright outdoor lighting.  Clear any walking paths of anything that might make someone trip, such as rocks or tools.  Regularly check to see if handrails are loose or broken. Make sure that both sides of any steps have handrails.  Any raised decks and porches should have guardrails on the edges.  Have any leaves, snow, or ice cleared  regularly.  Use sand or salt on walking paths during winter.  Clean up any spills in your garage right away. This includes oil or grease spills. WHAT CAN I DO IN THE BATHROOM?   Use night lights.  Install grab bars by the toilet and in the tub and shower. Do not use towel bars as grab bars.  Use non-skid mats or decals in the tub or shower.  If you need to sit down in the shower, use a plastic, non-slip stool.  Keep the floor dry. Clean up any water that spills on the floor as soon as it happens.  Remove soap buildup in the tub or shower regularly.  Attach bath mats securely with double-sided non-slip rug tape.  Do not have throw rugs and other things on the floor that can make you trip. WHAT CAN I DO IN THE BEDROOM?  Use night lights.  Make sure that you have a light by your bed that is easy to reach.  Do not use any sheets or blankets that are too big for your bed. They should not hang down onto the floor.  Have a firm chair that has side arms. You can use this for support while you get dressed.  Do not have  throw rugs and other things on the floor that can make you trip. WHAT CAN I DO IN THE KITCHEN?  Clean up any spills right away.  Avoid walking on wet floors.  Keep items that you use a lot in easy-to-reach places.  If you need to reach something above you, use a strong step stool that has a grab bar.  Keep electrical cords out of the way.  Do not use floor polish or wax that makes floors slippery. If you must use wax, use non-skid floor wax.  Do not have throw rugs and other things on the floor that can make you trip. WHAT CAN I DO WITH MY STAIRS?  Do not leave any items on the stairs.  Make sure that there are handrails on both sides of the stairs and use them. Fix handrails that are broken or loose. Make sure that handrails are as long as the stairways.  Check any carpeting to make sure that it is firmly attached to the stairs. Fix any carpet that is  loose or worn.  Avoid having throw rugs at the top or bottom of the stairs. If you do have throw rugs, attach them to the floor with carpet tape.  Make sure that you have a light switch at the top of the stairs and the bottom of the stairs. If you do not have them, ask someone to add them for you. WHAT ELSE CAN I DO TO HELP PREVENT FALLS?  Wear shoes that:  Do not have high heels.  Have rubber bottoms.  Are comfortable and fit you well.  Are closed at the toe. Do not wear sandals.  If you use a stepladder:  Make sure that it is fully opened. Do not climb a closed stepladder.  Make sure that both sides of the stepladder are locked into place.  Ask someone to hold it for you, if possible.  Clearly mark and make sure that you can see:  Any grab bars or handrails.  First and last steps.  Where the edge of each step is.  Use tools that help you move around (mobility aids) if they are needed. These include:  Canes.  Walkers.  Scooters.  Crutches.  Turn on the lights when you go into a dark area. Replace any light bulbs as soon as they burn out.  Set up your furniture so you have a clear path. Avoid moving your furniture around.  If any of your floors are uneven, fix them.  If there are any pets around you, be aware of where they are.  Review your medicines with your doctor. Some medicines can make you feel dizzy. This can increase your chance of falling. Ask your doctor what other things that you can do to help prevent falls.   This information is not intended to replace advice given to you by your health care provider. Make sure you discuss any questions you have with your health care provider.   Document Released: 09/14/2009 Document Revised: 04/04/2015 Document Reviewed: 12/23/2014 Elsevier Interactive Patient Education 2016 [place] Maintenance, Male A healthy lifestyle and preventative care can promote health and wellness.  Maintain regular  health, dental, and eye exams.  Eat a healthy diet. Foods like vegetables, fruits, whole grains, low-fat dairy products, and lean protein foods contain the nutrients you need and are low in calories. Decrease your intake of foods high in solid fats, added sugars, and salt. Get information about a proper diet from your health care  provider, if necessary.  Regular physical exercise is one of the most important things you can do for your health. Most adults should get at least 150 minutes of moderate-intensity exercise (any activity that increases your heart rate and causes you to sweat) each week. In addition, most adults need muscle-strengthening exercises on 2 or more days a week.   Maintain a healthy weight. The body mass index (BMI) is a screening tool to identify possible weight problems. It provides an estimate of body fat based on height and weight. Your health care provider can find your BMI and can help you achieve or maintain a healthy weight. For males 20 years and older:  A BMI below 18.5 is considered underweight.  A BMI of 18.5 to 24.9 is normal.  A BMI of 25 to 29.9 is considered overweight.  A BMI of 30 and above is considered obese.  Maintain normal blood lipids and cholesterol by exercising and minimizing your intake of saturated fat. Eat a balanced diet with plenty of fruits and vegetables. Blood tests for lipids and cholesterol should begin at age 21 and be repeated every 5 years. If your lipid or cholesterol levels are high, you are over age 34, or you are at high risk for heart disease, you may need your cholesterol levels checked more frequently.Ongoing high lipid and cholesterol levels should be treated with medicines if diet and exercise are not working.  If you smoke, find out from your health care provider how to quit. If you do not use tobacco, do not start.  Lung cancer screening is recommended for adults aged 73-80 years who are at high risk for developing lung  cancer because of a history of smoking. A yearly low-dose CT scan of the lungs is recommended for people who have at least a 30-pack-year history of smoking and are current smokers or have quit within the past 15 years. A pack year of smoking is smoking an average of 1 pack of cigarettes a day for 1 year (for example, a 30-pack-year history of smoking could mean smoking 1 pack a day for 30 years or 2 packs a day for 15 years). Yearly screening should continue until the smoker has stopped smoking for at least 15 years. Yearly screening should be stopped for people who develop a health problem that would prevent them from having lung cancer treatment.  If you choose to drink alcohol, do not have more than 2 drinks per day. One drink is considered to be 12 oz (360 mL) of beer, 5 oz (150 mL) of wine, or 1.5 oz (45 mL) of liquor.  Avoid the use of street drugs. Do not share needles with anyone. Ask for help if you need support or instructions about stopping the use of drugs.  High blood pressure causes heart disease and increases the risk of stroke. High blood pressure is more likely to develop in:  People who have blood pressure in the end of the normal range (100-139/85-89 mm Hg).  People who are overweight or obese.  People who are African American.  If you are 41-29 years of age, have your blood pressure checked every 3-5 years. If you are 39 years of age or older, have your blood pressure checked every year. You should have your blood pressure measured twice--once when you are at a hospital or clinic, and once when you are not at a hospital or clinic. Record the average of the two measurements. To check your blood pressure when you are  not at a hospital or clinic, you can use:  An automated blood pressure machine at a pharmacy.  A home blood pressure monitor.  If you are 52-45 years old, ask your health care provider if you should take aspirin to prevent heart disease.  Diabetes screening  involves taking a blood sample to check your fasting blood sugar level. This should be done once every 3 years after age 53 if you are at a normal weight and without risk factors for diabetes. Testing should be considered at a younger age or be carried out more frequently if you are overweight and have at least 1 risk factor for diabetes.  Colorectal cancer can be detected and often prevented. Most routine colorectal cancer screening begins at the age of 45 and continues through age 78. However, your health care provider may recommend screening at an earlier age if you have risk factors for colon cancer. On a yearly basis, your health care provider may provide home test kits to check for hidden blood in the stool. A small camera at the end of a tube may be used to directly examine the colon (sigmoidoscopy or colonoscopy) to detect the earliest forms of colorectal cancer. Talk to your health care provider about this at age 42 when routine screening begins. A direct exam of the colon should be repeated every 5-10 years through age 59, unless early forms of precancerous polyps or small growths are found.  People who are at an increased risk for hepatitis B should be screened for this virus. You are considered at high risk for hepatitis B if:  You were born in a country where hepatitis B occurs often. Talk with your health care provider about which countries are considered high risk.  Your parents were born in a high-risk country and you have not received a shot to protect against hepatitis B (hepatitis B vaccine).  You have HIV or AIDS.  You use needles to inject street drugs.  You live with, or have sex with, someone who has hepatitis B.  You are a man who has sex with other men (MSM).  You get hemodialysis treatment.  You take certain medicines for conditions like cancer, organ transplantation, and autoimmune conditions.  Hepatitis C blood testing is recommended for all people born from [birth year]  through 1965 and any individual with known risk factors for hepatitis C.  Healthy men should no longer receive prostate-specific antigen (PSA) blood tests as part of routine cancer screening. Talk to your health care provider about prostate cancer screening.  Testicular cancer screening is not recommended for adolescents or adult males who have no symptoms. Screening includes self-exam, a health care provider exam, and other screening tests. Consult with your health care provider about any symptoms you have or any concerns you have about testicular cancer.  Practice safe sex. Use condoms and avoid high-risk sexual practices to reduce the spread of sexually transmitted infections (STIs).  You should be screened for STIs, including gonorrhea and chlamydia if:  You are sexually active and are younger than 24 years.  You are older than 24 years, and your health care provider tells you that you are at risk for this type of infection.  Your sexual activity has changed since you were last screened, and you are at an increased risk for chlamydia or gonorrhea. Ask your health care provider if you are at risk.  If you are at risk of being infected with HIV, it is recommended that you take a prescription  medicine daily to prevent HIV infection. This is called pre-exposure prophylaxis (PrEP). You are considered at risk if:  You are a man who has sex with other men (MSM).  You are a heterosexual man who is sexually active with multiple partners.  You take drugs by injection.  You are sexually active with a partner who has HIV.  Talk with your health care provider about whether you are at high risk of being infected with HIV. If you choose to begin PrEP, you should first be tested for HIV. You should then be tested every 3 months for as long as you are taking PrEP.  Use sunscreen. Apply sunscreen liberally and repeatedly throughout the day. You should seek shade when your shadow is shorter than you.  Protect yourself by wearing long sleeves, pants, a wide-brimmed hat, and sunglasses year round whenever you are outdoors.  Tell your health care provider of new moles or changes in moles, especially if there is a change in shape or color. Also, tell your health care provider if a mole is larger than the size of a pencil eraser.  A one-time screening for abdominal aortic aneurysm (AAA) and surgical repair of large AAAs by ultrasound is recommended for men aged 65-75 years who are current or former smokers.  Stay current with your vaccines (immunizations).   This information is not intended to replace advice given to you by your health care provider. Make sure you discuss any questions you have with your health care provider.   Document Released: 05/16/2008 Document Revised: 12/09/2014 Document Reviewed: 04/15/2011 Elsevier Interactive Patient Education [employer]

## 2016-09-24 NOTE — Progress Notes (Addendum)
Subjective:   Clifford Gibson is a 70 y.o. male who presents for Medicare Annual/Subsequent preventive examination.  The Patient was informed that the wellness visit is to identify future health risk and educate and initiate measures that can reduce risk for increased disease through the lifespan.    NO ROS; Medicare Wellness Visit  Describes health as good, fair or great? Great   Preventive Screening -Counseling & Management   Current smoking/ Never smoked   Second hand smoke;  ETOH no  RISK FACTORS Regular exercise / taking care of the garden   Diet' watches what he eats Cans garden vegetables, makes fresh bread without preservatives and eats home grown food; no processed food  Had DM counseling Learned to spread out the carbs during the day / A2c down;   Fall risk: no falls  Mobility of Functional changes this year? No  Can do stairs ok since TKR a couple of years ago   Safety; community, wears sunscreen, safe place for firearms; Motor vehicle accidents; no issues   Cardiac Risk Factors:  Advanced aged > 55 in men;  Hyperlipidemia (HDL 38; Trig 148; LDL 81) Diabetes: A1c 6.7 16/1096 (down from 7) nutritional counselor helped  Family History /neg; (mother had scleroderma and father had dementia) Obesity: BMI 31 now; 218 from 65; from 68;  Eating better; agrees to start walking w spouse   Depression Screen PhQ 2: negative  Activities of Daily Living - See functional screen   Hearing Difficulty: no issues   Ophthalmology Exam:  Good checked x august  Dr. Nelle Don   Cognitive testing; Ad8 score; 0 or less than 2  MMSE deferred or completed if AD8 + 2 issues  Advanced Directives: YES   List the name of Physicians or other Practitioners you currently use:   Immunization History  Administered Date(s) Administered  . Influenza Whole 10/03/2011  . Influenza, High Dose Seasonal PF 09/24/2016  . Influenza-Unspecified 10/14/2015  . Pneumococcal Conjugate-13  02/03/2014  . Pneumococcal Polysaccharide-23 03/28/2010  . Td 01/07/2008   Required Immunizations needed today Agreed to high does flu shot today   Screening test up to date or reviewed for plan of completion Health Maintenance Due  Topic Date Due  . Hepatitis C Screening  1946/05/15   Cardiac Risk Factors include: advanced age (>62men, >81 women);diabetes mellitus;dyslipidemia;hypertension;male gender;obesity (BMI >30kg/m2) Medicare now request all "baby boomers" test for possible exposure to Hepatitis C. Many may have been exposed due to dental work, tatoo's, vaccinations when young. The Hepatitis C virus is dormant for many years and then sometimes will cause liver cancer. Agreed to hep c check at next blood draw   /Colonoscopy completed 06/2015     Objective:    Vitals: BP 118/70   Pulse 90   Ht 5\' 10"  (1.778 m)   Wt 221 lb 3 oz (100.3 kg)   SpO2 96%   BMI 31.74 kg/m   Body mass index is 31.74 kg/m.  Tobacco History  Smoking Status  . Never Smoker  Smokeless Tobacco  . Never Used     Counseling given: Yes   Past Medical History:  Diagnosis Date  . Acute serous otitis media 02/22/2010  . Arthritis   . Diabetes mellitus without complication (HCC)   . ERECTILE DYSFUNCTION 04/25/2009  . GERD 03/24/2009  . HYPERLIPIDEMIA 03/28/2010  . HYPERTENSION 03/24/2009  . OBESITY, MODERATE 04/25/2009  . PONV (postoperative nausea and vomiting)    Past Surgical History:  Procedure Laterality Date  .  COLONOSCOPY N/A 06/21/2015   Procedure: COLONOSCOPY;  Surgeon: Malissa Hippo, MD;  [facility];  Service: Endoscopy;  Laterality: N/A;  930 - moved to 7/20 @ 7:30  . NASAL SEPTUM SURGERY  1974  . TONSILLECTOMY     3 yrs. old  . TOTAL KNEE ARTHROPLASTY  06/11/2012   Procedure: TOTAL KNEE ARTHROPLASTY;  Surgeon: Javier Docker, MD;  Location: [facility];  Service: Orthopedics;  Laterality: Right;   Family History  Problem Relation Age of Onset  . Scleroderma Mother     . Dementia Father    History  Sexual Activity  . Sexual activity: Not on file    Outpatient Encounter Prescriptions as of 09/24/2016  Medication Sig  . ACCU-CHEK FASTCLIX LANCETS MISC Test once daily Dx E11.9  . aspirin EC 81 MG tablet Take 81 mg by mouth daily.  Marland Kitchen atorvastatin (LIPITOR) 10 MG tablet TAKE 1 TABLET DAILY  . fluticasone (FLONASE) 50 MCG/ACT nasal spray 1 SPRAY NASALLY ONCE A DAY  . glucose blood (ACCU-CHEK AVIVA) test strip Test once daily. E11.9  . Magnesium Oxide (MAG-OX 400 PO) Take 400 mg by mouth daily with breakfast.  . Omega 3-6-9 Fatty Acids (OMEGA-3 & OMEGA-6 FISH OIL PO) Take 1,200 mg by mouth daily.  . sildenafil (VIAGRA) 100 MG tablet Take 100 mg by mouth daily as needed. For ED  . valsartan-hydrochlorothiazide (DIOVAN-HCT) 160-12.5 MG tablet TAKE ONE TABLET AT BEDTIME  . meclizine (ANTIVERT) 25 MG tablet Take 25 mg by mouth 3 (three) times daily as needed. For dizziness  . metFORMIN (GLUCOPHAGE) 500 MG tablet Take 1 tablet (500 mg total) by mouth 2 (two) times daily with a meal.  . pantoprazole (PROTONIX) 40 MG tablet Take 1 tablet (40 mg total) by mouth daily. (Patient not taking: Reported on 09/24/2016)   No facility-administered encounter medications on file as of 09/24/2016.     Activities of Daily Living In your present state of health, do you have any difficulty performing the following activities: 09/24/2016  Hearing? N  Vision? N  Difficulty concentrating or making decisions? N  Walking or climbing stairs? Y  Dressing or bathing? N  Doing errands, shopping? N  Preparing Food and eating ? N  Using the Toilet? N  In the past six months, have you accidently leaked urine? N  Do you have problems with loss of bowel control? N  Managing your Medications? N  Managing your Finances? N  Some recent data might be hidden    Patient Care Team: Kristian Covey, MD as PCP - General   Assessment:     ASSESSMENT INCLUDED:   Review for health  history including a functional assessment, fall risk, depression screen, memory loss, vision and hearing screens; Was educated and referred as appropriate. See Plan   Psychosocial risk reviewed as stress; unresolved grief; pain; lack of support; lack of income to buy groceries, meds etc.  Behavioral risk addressed such as tobacco, ETOH; diet (metabolic syndrome) and exercise  Risk for independent living or long term plan   Risk for safety; Bathroom; community; firearms, sun protection; auto accidents   All immunizations and overdue screens were reviewed for a plan or follow-up.   Labs were reviewed in regard to Lipids and A1c if appropriate.   Discussed Recommended screenings and documented any personalized health advice and referrals for preventive counseling.  See AVS for patient instructions;   Exercise Activities and Dietary recommendations Current Exercise Habits: Home exercise routine, Time (Minutes): 60,  Frequency (Times/Week): 5, Weekly Exercise (Minutes/Week): 300, Intensity: Moderate Very active in his garden;  Will state walking x 30 min 3 times a week  Goals    . Exercise 150 minutes per week (moderate activity)          Will start an exercising; walking plan together at nearby chruch; safe area       Fall Risk Fall Risk  09/24/2016 11/21/2015 11/09/2015 11/02/2015 02/02/2015  Falls in the past year? No No No No No   Depression Screen PHQ 2/9 Scores 09/24/2016 11/21/2015 11/09/2015 11/02/2015  PHQ - 2 Score 0 0 0 0    Cognitive Function MMSE - Mini Mental State Exam 09/24/2016  Not completed: (No Data)    Ad8 score 0     Immunization History  Administered Date(s) Administered  . Influenza Whole 10/03/2011  . Influenza, High Dose Seasonal PF 09/24/2016  . Influenza-Unspecified 10/14/2015  . Pneumococcal Conjugate-13 02/03/2014  . Pneumococcal Polysaccharide-23 03/28/2010  . Td 01/07/2008   Screening Tests Health Maintenance  Topic Date Due  . Hepatitis C  Screening  1946/09/08  . INFLUENZA VACCINE  10/10/2016 (Originally 07/02/2016)  . FOOT EXAM  02/20/2017  . HEMOGLOBIN A1C  03/10/2017  . OPHTHALMOLOGY EXAM  07/11/2017  . TETANUS/TDAP  01/06/2018  . COLONOSCOPY  07/08/2026  . ZOSTAVAX  Addressed  . PNA vac Low Risk Adult  Completed      Plan:     1. Hep c educated and agreed to draw at next blood draw  2. High does flu vaccine taken today   3. Taking Zantac now otc per MD.   4. Foot exam was completed/ normal;  Having pedicures; no issues at this time; checks feet every day   Patient concerns: none   During the course of the visit the patient was educated and counseled about the following appropriate screening and preventive services:   Vaccines to include Pneumoccal, Influenza, Hepatitis B, Td, Zostavax, HCV  Electrocardiogram  Cardiovascular Disease  Colorectal cancer screening 07/2016  Diabetes screening/ good management with improvement in A1c  Prostate Cancer Screening deferred   Glaucoma screening/ eye exam in August;   Nutrition counseling / by dietician  Smoking cessation counseling/n/a  No referrals needed   Patient Instructions (the written plan) was given to the patient.    Tilak Oakley, RN  09/24/2016  I have reviewed this note and I agree with it's contents.  Nelwyn SalisburyFRY,STEPHEN A, MD

## 2016-10-22 ENCOUNTER — Other Ambulatory Visit: Payer: Self-pay | Admitting: Family Medicine

## 2016-11-12 ENCOUNTER — Other Ambulatory Visit: Payer: Self-pay | Admitting: Family Medicine

## 2016-12-24 ENCOUNTER — Other Ambulatory Visit: Payer: Self-pay | Admitting: Family Medicine

## 2017-04-29 ENCOUNTER — Other Ambulatory Visit: Payer: Self-pay | Admitting: Family Medicine

## 2017-06-17 ENCOUNTER — Encounter: Payer: Self-pay | Admitting: Family Medicine

## 2017-07-28 ENCOUNTER — Other Ambulatory Visit: Payer: Self-pay | Admitting: Family Medicine

## 2017-08-21 ENCOUNTER — Encounter: Payer: Self-pay | Admitting: Family Medicine

## 2017-09-15 ENCOUNTER — Encounter: Payer: Self-pay | Admitting: Family Medicine

## 2017-09-15 ENCOUNTER — Ambulatory Visit (INDEPENDENT_AMBULATORY_CARE_PROVIDER_SITE_OTHER): Payer: Medicare Other | Admitting: Family Medicine

## 2017-09-15 VITALS — BP 110/78 | HR 84 | Temp 98.5°F | Wt 219.3 lb

## 2017-09-15 DIAGNOSIS — H6121 Impacted cerumen, right ear: Secondary | ICD-10-CM

## 2017-09-15 NOTE — Progress Notes (Signed)
Subjective:     Patient ID: Clifford Gibson, male   DOB: 1946-08-23, 71 y.o.   MRN: [id number]  HPI Patient seen with fullness involving the right ear. He thinks he may have some wax issues. No left-sided symptoms. His been dealing with this for several weeks. He tried some wax softener without much improvement. Symptoms seem to be worse after getting out of shower. No ear pain. No vertigo. Hearing seems to be off on the right side.    Past Medical History:  Diagnosis Date  . Acute serous otitis media 02/22/2010  . Arthritis   . Diabetes mellitus without complication (HCC)   . ERECTILE DYSFUNCTION 04/25/2009  . GERD 03/24/2009  . HYPERLIPIDEMIA 03/28/2010  . HYPERTENSION 03/24/2009  . OBESITY, MODERATE 04/25/2009  . PONV (postoperative nausea and vomiting)    Past Surgical History:  Procedure Laterality Date  . COLONOSCOPY N/A 06/21/2015   Procedure: COLONOSCOPY;  Surgeon: Malissa Hippo, MD;  [facility];  Service: Endoscopy;  Laterality: N/A;  930 - moved to 7/20 @ 7:30  . NASAL SEPTUM SURGERY  1974  . TONSILLECTOMY     3 yrs. old  . TOTAL KNEE ARTHROPLASTY  06/11/2012   Procedure: TOTAL KNEE ARTHROPLASTY;  Surgeon: Javier Docker, MD;  Location: [facility];  Service: Orthopedics;  Laterality: Right;    reports that he has never smoked. He has never used smokeless tobacco. He reports that he does not drink alcohol or use drugs. family history includes Dementia in his father; Scleroderma in his mother. Allergies  Allergen Reactions  . Penicillins Other (See Comments)    Reaction=bleeding,diarrhea     Review of Systems  Constitutional: Negative for chills and fever.  HENT: Negative for ear discharge and ear pain.   Neurological: Negative for dizziness and weakness.       Objective:   Physical Exam  Constitutional: He appears well-developed and well-nourished.  HENT:  Cerumen impaction right canal-fully obscuring eardrum. Left is clear  Cardiovascular: Normal rate  and regular rhythm.   Pulmonary/Chest: Effort normal and breath sounds normal. No respiratory distress. He has no wheezes. He has no rales.       Assessment:     Cerumen impaction right canal-Removed with irrigation    Plan:     -Removal by irrigation -Patient has follow-up within 1 month reassess his multiple medical problems  Kristian Covey MD [facility]

## 2017-09-15 NOTE — Patient Instructions (Signed)
Earwax Buildup, Adult The ears produce a substance called earwax that helps keep bacteria out of the ear and protects the skin in the ear canal. Occasionally, earwax can build up in the ear and cause discomfort or hearing loss. What increases the risk? This condition is more likely to develop in people who:  Are male.  Are elderly.  Naturally produce more earwax.  Clean their ears often with cotton swabs.  Use earplugs often.  Use in-ear headphones often.  Wear hearing aids.  Have narrow ear canals.  Have earwax that is overly thick or sticky.  Have eczema.  Are dehydrated.  Have excess hair in the ear canal.  What are the signs or symptoms? Symptoms of this condition include:  Reduced or muffled hearing.  A feeling of fullness in the ear or feeling that the ear is plugged.  Fluid coming from the ear.  Ear pain.  Ear itch.  Ringing in the ear.  Coughing.  An obvious piece of earwax that can be seen inside the ear canal.  How is this diagnosed? This condition may be diagnosed based on:  Your symptoms.  Your medical history.  An ear exam. During the exam, your health care provider will look into your ear with an instrument called an otoscope.  You may have tests, including a hearing test. How is this treated? This condition may be treated by:  Using ear drops to soften the earwax.  Having the earwax removed by a health care provider. The health care provider may: ? Flush the ear with water. ? Use an instrument that has a loop on the end (curette). ? Use a suction device.  Surgery to remove the wax buildup. This may be done in severe cases.  Follow these instructions at home:  Take over-the-counter and prescription medicines only as told by your health care provider.  Do not put any objects, including cotton swabs, into your ear. You can clean the opening of your ear canal with a washcloth or facial tissue.  Follow instructions from your health  care provider about cleaning your ears. Do not over-clean your ears.  Drink enough fluid to keep your urine clear or pale yellow. This will help to thin the earwax.  Keep all follow-up visits as told by your health care provider. If earwax builds up in your ears often or if you use hearing aids, consider seeing your health care provider for routine, preventive ear cleanings. Ask your health care provider how often you should schedule your cleanings.  If you have hearing aids, clean them according to instructions from the manufacturer and your health care provider. Contact a health care provider if:  You have ear pain.  You develop a fever.  You have blood, pus, or other fluid coming from your ear.  You have hearing loss.  You have ringing in your ears that does not go away.  Your symptoms do not improve with treatment.  You feel like the room is spinning (vertigo). Summary  Earwax can build up in the ear and cause discomfort or hearing loss.  The most common symptoms of this condition include reduced or muffled hearing and a feeling of fullness in the ear or feeling that the ear is plugged.  This condition may be diagnosed based on your symptoms, your medical history, and an ear exam.  This condition may be treated by using ear drops to soften the earwax or by having the earwax removed by a health care provider.  Do   not put any objects, including cotton swabs, into your ear. You can clean the opening of your ear canal with a washcloth or facial tissue. This information is not intended to replace advice given to you by your health care provider. Make sure you discuss any questions you have with your health care provider. Document Released: 12/26/2004 Document Revised: 01/29/2017 Document Reviewed: 01/29/2017 Elsevier Interactive Patient Education  2018 [employer]  

## 2017-10-01 ENCOUNTER — Other Ambulatory Visit: Payer: Self-pay | Admitting: Family Medicine

## 2017-10-25 ENCOUNTER — Other Ambulatory Visit: Payer: Self-pay | Admitting: Family Medicine

## 2017-10-28 ENCOUNTER — Ambulatory Visit (INDEPENDENT_AMBULATORY_CARE_PROVIDER_SITE_OTHER): Payer: Medicare Other | Admitting: Family Medicine

## 2017-10-28 VITALS — BP 120/80 | HR 78 | Temp 98.2°F | Ht 66.5 in | Wt 219.2 lb

## 2017-10-28 DIAGNOSIS — Z0000 Encounter for general adult medical examination without abnormal findings: Secondary | ICD-10-CM | POA: Diagnosis not present

## 2017-10-28 LAB — BASIC METABOLIC PANEL
BUN: 26 mg/dL — AB (ref 6–23)
CHLORIDE: 102 meq/L (ref 96–112)
CO2: 29 meq/L (ref 19–32)
CREATININE: 1.15 mg/dL (ref 0.40–1.50)
Calcium: 10.5 mg/dL (ref 8.4–10.5)
GFR: 66.28 mL/min (ref >60.00)
GLUCOSE: 133 mg/dL — AB (ref 70–99)
POTASSIUM: 4.4 meq/L (ref 3.5–5.1)
Sodium: 140 mEq/L (ref 135–145)

## 2017-10-28 LAB — CBC WITH DIFFERENTIAL/PLATELET
BASOS ABS: 0 10*3/uL (ref 0.0–0.1)
Basophils Relative: 0.6 % (ref 0.0–3.0)
Eosinophils Absolute: 0.1 10*3/uL (ref 0.0–0.7)
Eosinophils Relative: 1.6 % (ref 0.0–5.0)
HEMATOCRIT: 43.3 % (ref 39.0–52.0)
HEMOGLOBIN: 14.3 g/dL (ref 13.0–17.0)
LYMPHS PCT: 26.9 % (ref 12.0–46.0)
Lymphs Abs: 2.1 10*3/uL (ref 0.7–4.0)
MCHC: 33 g/dL (ref 30.0–36.0)
MCV: 89.4 fl (ref 78.0–100.0)
MONO ABS: 0.6 10*3/uL (ref 0.1–1.0)
Monocytes Relative: 8.1 % (ref 3.0–12.0)
Neutro Abs: 4.9 10*3/uL (ref 1.4–7.7)
Neutrophils Relative %: 62.8 % (ref 43.0–77.0)
PLATELETS: 289 10*3/uL (ref 150.0–400.0)
RBC: 4.85 Mil/uL (ref 4.22–5.81)
RDW: 14.5 % (ref 11.5–15.5)
WBC: 7.8 10*3/uL (ref 4.0–10.5)

## 2017-10-28 LAB — LIPID PANEL
CHOL/HDL RATIO: 4
Cholesterol: 157 mg/dL (ref 0–200)
HDL: 44.1 mg/dL (ref >39.00)
LDL CALC: 87 mg/dL (ref 0–99)
NonHDL: 113.32
Triglycerides: 131 mg/dL (ref 0.0–149.0)
VLDL: 26.2 mg/dL (ref 0.0–40.0)

## 2017-10-28 LAB — HEPATIC FUNCTION PANEL
ALT: 15 U/L (ref 0–53)
AST: 14 U/L (ref 0–37)
Albumin: 4.6 g/dL (ref 3.5–5.2)
Alkaline Phosphatase: 44 U/L (ref 39–117)
BILIRUBIN DIRECT: 0.1 mg/dL (ref 0.0–0.3)
BILIRUBIN TOTAL: 0.6 mg/dL (ref 0.2–1.2)
Total Protein: 6.7 g/dL (ref 6.0–8.3)

## 2017-10-28 LAB — TSH: TSH: 1.16 u[IU]/mL (ref 0.35–4.50)

## 2017-10-28 LAB — HEMOGLOBIN A1C: Hgb A1c MFr Bld: 6.7 % — ABNORMAL HIGH (ref 4.6–6.5)

## 2017-10-28 NOTE — Progress Notes (Signed)
Subjective:     Patient ID: Clifford Gibson, male   DOB: 1946-06-14, 71 y.o.   MRN: [id number]  HPI Patient seen for physical exam. Chronic problems include hypertension, GERD, type 2 diabetes, obesity. He states his blood sugars been fairly well controlled. He is overdue for A1c. Last one was well controlled. Needs hepatitis C antibody. He's had previous shingles vaccine but not Shingrix.  No consistent exercise. Flu vaccine already given. Pneumonia vaccinations up-to-date. Colonoscopy up-to-date. He has decided against any further PSA testing after full discussion of pros and cons  Past Medical History:  Diagnosis Date  . Acute serous otitis media 02/22/2010  . Arthritis   . Diabetes mellitus without complication (HCC)   . ERECTILE DYSFUNCTION 04/25/2009  . GERD 03/24/2009  . HYPERLIPIDEMIA 03/28/2010  . HYPERTENSION 03/24/2009  . OBESITY, MODERATE 04/25/2009  . PONV (postoperative nausea and vomiting)    Past Surgical History:  Procedure Laterality Date  . COLONOSCOPY N/A 06/21/2015   Procedure: COLONOSCOPY;  Surgeon: Malissa HippoNajeeb U Rehman, MD;  [facility];  Service: Endoscopy;  Laterality: N/A;  930 - moved to 7/20 @ 7:30  . NASAL SEPTUM SURGERY  1974  . TONSILLECTOMY     3 yrs. old  . TOTAL KNEE ARTHROPLASTY  06/11/2012   Procedure: TOTAL KNEE ARTHROPLASTY;  Surgeon: Javier DockerJeffrey C Beane, MD;  Location: [facility];  Service: Orthopedics;  Laterality: Right;    reports that  has never smoked. he has never used smokeless tobacco. He reports that he does not drink alcohol or use drugs. family history includes Dementia in his father; Scleroderma in his mother. Allergies  Allergen Reactions  . Penicillins Other (See Comments)    Reaction=bleeding,diarrhea     Review of Systems  Constitutional: Negative for activity change, appetite change, fatigue and fever.  HENT: Negative for congestion, ear pain and trouble swallowing.   Eyes: Negative for pain and visual disturbance.   Respiratory: Negative for cough, shortness of breath and wheezing.   Cardiovascular: Negative for chest pain and palpitations.  Gastrointestinal: Negative for abdominal distention, abdominal pain, blood in stool, constipation, diarrhea, nausea, rectal pain and vomiting.  Endocrine: Negative for polydipsia and polyuria.  Genitourinary: Negative for dysuria, hematuria and testicular pain.  Musculoskeletal: Negative for arthralgias and joint swelling.  Skin: Negative for rash.  Neurological: Negative for dizziness, syncope and headaches.  Hematological: Negative for adenopathy.  Psychiatric/Behavioral: Negative for confusion and dysphoric mood.       Objective:   Physical Exam  Constitutional: He is oriented to person, place, and time. He appears well-developed and well-nourished. No distress.  HENT:  Head: Normocephalic and atraumatic.  Right Ear: External ear normal.  Left Ear: External ear normal.  Mouth/Throat: Oropharynx is clear and moist.  Eyes: Conjunctivae and EOM are normal. Pupils are equal, round, and reactive to light.  Neck: Normal range of motion. Neck supple. No thyromegaly present.  Cardiovascular: Normal rate, regular rhythm and normal heart sounds.  No murmur heard. Pulmonary/Chest: No respiratory distress. He has no wheezes. He has no rales.  Abdominal: Soft. Bowel sounds are normal. He exhibits no distension and no mass. There is no tenderness. There is no rebound and no guarding.  Musculoskeletal: He exhibits no edema.  Lymphadenopathy:    He has no cervical adenopathy.  Neurological: He is alert and oriented to person, place, and time. He displays normal reflexes. No cranial nerve deficit.  Skin: No rash noted.  Psychiatric: He has a normal mood and affect.  Assessment:     Physical exam. The following issues were discussed    Plan:     -Check hepatitis C antibody -Obtain other labs including hemoglobin A1c. -The natural history of prostate  cancer and ongoing controversy regarding screening and potential treatment outcomes of prostate cancer has been discussed with the patient. The meaning of a false positive PSA and a false negative PSA has been discussed. He indicates understanding of the limitations of this screening test and wishes NOT to proceed with screening PSA testing. -Will need tetanus booster next year -Encouraged him to lose some weight -Continue with yearly eye exam given his type 2 diabetes  Kristian CoveyBruce W Burchette MD [facility]

## 2017-10-28 NOTE — Patient Instructions (Signed)
Consider new shingles vaccine (Shingrix) and let us know  If you are interested.

## 2017-10-29 LAB — HEPATITIS C ANTIBODY
Hepatitis C Ab: NONREACTIVE
SIGNAL TO CUT-OFF: 0.01 (ref <1.00)

## 2017-11-19 ENCOUNTER — Other Ambulatory Visit: Payer: Self-pay | Admitting: Family Medicine

## 2018-02-20 ENCOUNTER — Other Ambulatory Visit: Payer: Self-pay | Admitting: Family Medicine

## 2018-02-20 DIAGNOSIS — R7309 Other abnormal glucose: Secondary | ICD-10-CM

## 2018-02-20 DIAGNOSIS — E1121 Type 2 diabetes mellitus with diabetic nephropathy: Secondary | ICD-10-CM

## 2018-03-09 NOTE — Progress Notes (Signed)
Subjective:   Clifford Gibson is a 72 y.o. male who presents for Medicare Annual/Subsequent preventive examination.  Reports health good;  Diet Chol 157; hdl 44; trig 131 A1c 6.7  Just bought 1/4 of grass fed cow  Checks his BS (out of lancets)  Will reorder you glucometer/lancets  BMI 33   Exercise Hdl 44;  Work and [employer] and gardening;  Wood working shop and built cabinets    There are no preventive care reminders to display for this patient.  Foot exam completed  He gets a pedicure at someone who is very professional  Colonoscopy 07/2016   Eye exam in may with Dr. Nelle Don [facility]  Postponed until June   [facility] took the old zoster Educated regarding the new one   Cardiac Risk Factors include: advanced age (>58men, >58 women);diabetes mellitus;dyslipidemia;family history of premature cardiovascular disease;hypertension;obesity (BMI >30kg/m2)     Objective:    Vitals: BP 140/80   Pulse 73   Ht 5\' 8"  (1.727 m)   Wt 220 lb 8 oz (100 kg)   SpO2 98%   BMI 33.53 kg/m   Body mass index is 33.53 kg/m.  Advanced Directives 03/10/2018 09/24/2016 11/02/2015 06/21/2015 06/11/2012 06/11/2012 06/03/2012  Does Patient Have a Medical Advance Directive? Yes Yes Yes Yes Patient does not have advance directive Patient does not have advance directive;Patient would not like information Patient does not have advance directive;Patient would not like information  Type of Advance Directive - - - Living will - - -  Copy of Healthcare Power of Attorney in Chart? - - - No - copy requested - - -  Would patient like information on creating a medical advance directive? - - - No - patient declined information - - -  Pre-existing out of facility DNR order (yellow form or pink MOST form) - - - - - No No    Tobacco Social History   Tobacco Use  Smoking Status Never Smoker  Smokeless Tobacco Never Used     Counseling given: Yes   Clinical Intake:   Past  Medical History:  Diagnosis Date  . Acute serous otitis media 02/22/2010  . Arthritis   . Diabetes mellitus without complication (HCC)   . ERECTILE DYSFUNCTION 04/25/2009  . GERD 03/24/2009  . HYPERLIPIDEMIA 03/28/2010  . HYPERTENSION 03/24/2009  . OBESITY, MODERATE 04/25/2009  . PONV (postoperative nausea and vomiting)    Past Surgical History:  Procedure Laterality Date  . COLONOSCOPY N/A 06/21/2015   Procedure: COLONOSCOPY;  Surgeon: Malissa Hippo, MD;  Location: [facility];  Service: Endoscopy;  Laterality: N/A;  930 - moved to 7/20 @ 7:30  . NASAL SEPTUM SURGERY  1974  . TONSILLECTOMY     3 yrs. old  . TOTAL KNEE ARTHROPLASTY  06/11/2012   Procedure: TOTAL KNEE ARTHROPLASTY;  Surgeon: Javier Docker, MD;  Location: [facility];  Service: Orthopedics;  Laterality: Right;   Family History  Problem Relation Age of Onset  . Scleroderma Mother   . Dementia Father    Social History   Socioeconomic History  . Marital status: Married    Spouse name: Not on file  . Number of children: Not on file  . Years of education: Not on file  . Highest education level: Not on file  Occupational History  . Not on file  Social Needs  . Financial resource strain: Not on file  . Food insecurity:    Worry: Not on file  Inability: Not on file  . Transportation needs:    Medical: Not on file    Non-medical: Not on file  Tobacco Use  . Smoking status: Never Smoker  . Smokeless tobacco: Never Used  Substance and Sexual Activity  . Alcohol use: No  . Drug use: No  . Sexual activity: Not on file  Lifestyle  . Physical activity:    Days per week: Not on file    Minutes per session: Not on file  . Stress: Not on file  Relationships  . Social connections:    Talks on phone: Not on file    Gets together: Not on file    Attends religious service: Not on file    Active member of club or organization: Not on file    Attends meetings of clubs or organizations: Not on file    Relationship  status: Not on file  Other Topics Concern  . Not on file  Social History Narrative  . Not on file    Outpatient Encounter Medications as of 03/10/2018  Medication Sig  . ACCU-CHEK FASTCLIX LANCETS MISC Test once daily Dx E11.9  . aspirin EC 81 MG tablet Take 81 mg by mouth daily.  Marland Kitchen atorvastatin (LIPITOR) 10 MG tablet TAKE 1 TABLET DAILY  . fluticasone (FLONASE) 50 MCG/ACT nasal spray 1 SPRAY NASALLY ONCE A DAY  . glucose blood (ACCU-CHEK AVIVA) test strip Test once daily. E11.9  . metFORMIN (GLUCOPHAGE) 500 MG tablet TAKE 1 TABLET 2 TIMES A DAY WITH A MEAL  . sildenafil (VIAGRA) 100 MG tablet Take 100 mg by mouth daily as needed. For ED  . valsartan-hydrochlorothiazide (DIOVAN-HCT) 160-12.5 MG tablet TAKE ONE TABLET AT BEDTIME  . [DISCONTINUED] ACCU-CHEK FASTCLIX LANCETS MISC Test once daily Dx E11.9  . [DISCONTINUED] glucose blood (ACCU-CHEK AVIVA) test strip Test once daily. E11.9  . Blood Glucose Monitoring Suppl (ACCU-CHEK AVIVA) device Use as instructed   No facility-administered encounter medications on file as of 03/10/2018.     Activities of Daily Living In your present state of health, do you have any difficulty performing the following activities: 03/10/2018  Hearing? N  Vision? N  Difficulty concentrating or making decisions? N  Walking or climbing stairs? N  Dressing or bathing? N  Doing errands, shopping? N  Preparing Food and eating ? N  Using the Toilet? N  In the past six months, have you accidently leaked urine? N  Do you have problems with loss of bowel control? N  Managing your Medications? N  Managing your Finances? N  Housekeeping or managing your Housekeeping? N  Some recent data might be hidden    Patient Care Team: Kristian Covey, MD as PCP - General   Assessment:   This is a routine wellness examination for Clifford Gibson.  Exercise Activities and Dietary recommendations Current Exercise Habits: Home exercise routine, Time (Minutes): 60, Frequency  (Times/Week): 3, Weekly Exercise (Minutes/Week): 180, Intensity: Moderate  Goals    . Weight (lb) < 205 lb (93 kg)     Walk more with intent Generally, it takes 60 minutes of exercise for weight loss   Check out  online nutrition programs as WikiBlast.com.cy and LimitLaws.com.cy; fit68me; Calorie king.com  Look for foods with "whole" wheat; bran; oatmeal etc Shot at the farmer's markets in season for fresher choices  Watch for "hydrogenated" on the label of oils which are trans-fats.  Watch for "high fructose corn syrup" in snacks, yogurt or ketchup  Meats have less marbling; bright colored  fruits and vegetables;  Canned; dump out liquid and wash vegetables. Be mindful of what we are eating  Portion control is essential to a health weight! Sit down; take a break and enjoy your meal; take smaller bites; put the fork down between bites;  It takes 20 minutes to get full; so check in with your fullness cues and stop eating when you start to fill full              Fall Risk Fall Risk  03/10/2018 10/28/2017 09/24/2016 11/21/2015 11/09/2015  Falls in the past year? No No No No No     Depression Screen PHQ 2/9 Scores 03/10/2018 10/28/2017 09/24/2016 11/21/2015  PHQ - 2 Score 0 0 0 0    Cognitive Function MMSE - Mini Mental State Exam 09/24/2016  Not completed: (No Data)        Immunization History  Administered Date(s) Administered  . Influenza Whole 10/03/2011  . Influenza, High Dose Seasonal PF 09/24/2016  . Influenza-Unspecified 10/14/2015, 09/12/2017  . Pneumococcal Conjugate-13 02/03/2014  . Pneumococcal Polysaccharide-23 03/28/2010  . Td 01/07/2008      Screening Tests Health Maintenance  Topic Date Due  . OPHTHALMOLOGY EXAM  05/31/2018 (Originally 02/04/2018)  . TETANUS/TDAP  03/11/2019 (Originally 01/06/2018)  . HEMOGLOBIN A1C  04/27/2018  . INFLUENZA VACCINE  07/02/2018  . FOOT EXAM  03/11/2019  . COLONOSCOPY  07/08/2026  . Hepatitis C Screening  Completed    . PNA vac Low Risk Adult  Completed      Plan:      PCP Notes   Health Maintenance Foot exam completed today Has not been checking BS; out of lancets and strips. Reordered today   Will update tetanus at the pharmacy  Educated regarding the shingrix   Educated on BMI and central obesity  Agrees to consider 15 pound weight loss   Abnormal Screens  BP mod elevated but states he checks it at home and it is 120/70 most of the time   Referrals  To [facility] with the [facility] if he has difficulty losing weight   Patient concerns; Has been taking atorvastatin x 2 years Now c/o ankle soreness; no c/o of neuropathy.  Points to area around shin.  ? OA or other. States he has noticed this since being on the statin.  Will ask Dr. Selena BattenKim to advise in Dr. Lucie LeatherBurchette's absence   Reordered his glucometer as well as his test strips and lancets.    Nurse Concerns; As noted   Next PCP apt physical's are completed in November     I have personally reviewed and noted the following in the patient's chart:   . Medical and social history . Use of alcohol, tobacco or illicit drugs  . Current medications and supplements . Functional ability and status . Nutritional status . Physical activity . Advanced directives . List of other physicians . Hospitalizations, surgeries, and ER visits in previous 12 months . Vitals . Screenings to include cognitive, depression, and falls . Referrals and appointments  In addition, I have reviewed and discussed with patient certain preventive protocols, quality metrics, and best practice recommendations. A written personalized care plan for preventive services as well as general preventive health recommendations were provided to patient.     Montine CircleHauck,Marion Rosenberry, RN  03/10/2018

## 2018-03-10 ENCOUNTER — Telehealth: Payer: Self-pay

## 2018-03-10 ENCOUNTER — Other Ambulatory Visit (INDEPENDENT_AMBULATORY_CARE_PROVIDER_SITE_OTHER): Payer: Medicare Other

## 2018-03-10 ENCOUNTER — Ambulatory Visit (INDEPENDENT_AMBULATORY_CARE_PROVIDER_SITE_OTHER): Payer: Medicare Other

## 2018-03-10 VITALS — BP 140/80 | HR 73 | Ht 68.0 in | Wt 220.5 lb

## 2018-03-10 DIAGNOSIS — Z0000 Encounter for general adult medical examination without abnormal findings: Secondary | ICD-10-CM

## 2018-03-10 DIAGNOSIS — E1121 Type 2 diabetes mellitus with diabetic nephropathy: Secondary | ICD-10-CM | POA: Diagnosis not present

## 2018-03-10 DIAGNOSIS — R7309 Other abnormal glucose: Secondary | ICD-10-CM

## 2018-03-10 LAB — BASIC METABOLIC PANEL WITH GFR
BUN: 16 mg/dL (ref 6–23)
CO2: 30 meq/L (ref 19–32)
Calcium: 9.3 mg/dL (ref 8.4–10.5)
Chloride: 103 meq/L (ref 96–112)
Creatinine, Ser: 1.02 mg/dL (ref 0.40–1.50)
GFR: 76.04 mL/min (ref >60.00)
Glucose, Bld: 121 mg/dL — ABNORMAL HIGH (ref 70–99)
Potassium: 4.3 meq/L (ref 3.5–5.1)
Sodium: 143 meq/L (ref 135–145)

## 2018-03-10 LAB — HEMOGLOBIN A1C: Hgb A1c MFr Bld: 6.7 % — ABNORMAL HIGH (ref 4.6–6.5)

## 2018-03-10 MED ORDER — GLUCOSE BLOOD VI STRP: ORAL_STRIP | 12 refills | Status: AC

## 2018-03-10 MED ORDER — ACCU-CHEK FASTCLIX LANCETS MISC: 3 refills | Status: AC

## 2018-03-10 MED ORDER — ACCU-CHEK AVIVA DEVI: 0 refills | Status: AC

## 2018-03-10 NOTE — Progress Notes (Signed)
Signing in PCP absence. Recommend appointment to address ankle concerns - perhaps with PCP if this has been going on for some time? Thanks. Terressa KoyanagiHannah R Kim, DO

## 2018-03-10 NOTE — Telephone Encounter (Signed)
AWV 04/09  He is taking his statin, but wife noted and he c/o of "ankle pain" which he feels started when he started the statin.(lipitor 10mg  )  Please advise. [place] the [place] but you can reach him on his phone or advise recommendation.

## 2018-03-10 NOTE — Patient Instructions (Addendum)
Clifford Gibson , Thank you for taking time to come for your Medicare Wellness Visit. I appreciate your ongoing commitment to your health goals. Please review the following plan we discussed and let me know if I can assist you in the future.   Will reorder your glucometer and you BS lancets after checking to see what your insurance will cover   You can re-visit the [facility]   Will let Dr. Caryl Never know about your increased pain in ankles;  Will outreach next week; you will be on vacation so will fup with your phone call or my chart.  Cell [phone]  A Tetanus is recommended every 10 years. Medicare covers a tetanus if you have a cut or wound; otherwise, there may be a charge. If you had not had a tetanus with pertusses, known as the Tdap, you can take this anytime.   Shingrix is a vaccine for the prevention of Shingles in Adults 50 and older.  If you are on Medicare, you can request a prescription from your doctor to be filled at a pharmacy.  Please check with your benefits regarding applicable copays or out of pocket expenses.  The Shingrix is given in 2 vaccines approx 8 weeks apart. You must receive the 2nd dose prior to 6 months from receipt of the first.    These are the goals we discussed: Goals    . Weight (lb) < 205 lb (93 kg)     Walk more with intent Generally, it takes 60 minutes of exercise for weight loss   Check out  online nutrition programs as WikiBlast.com.cy and LimitLaws.com.cy; fit76me; Calorie king.com  Look for foods with "whole" wheat; bran; oatmeal etc Shot at the farmer's markets in season for fresher choices  Watch for "hydrogenated" on the label of oils which are trans-fats.  Watch for "high fructose corn syrup" in snacks, yogurt or ketchup  Meats have less marbling; bright colored fruits and vegetables;  Canned; dump out liquid and wash vegetables. Be mindful of what we are eating  Portion control is essential to a health  weight! Sit down; take a break and enjoy your meal; take smaller bites; put the fork down between bites;  It takes 20 minutes to get full; so check in with your fullness cues and stop eating when you start to fill full              This is a list of the screening recommended for you and due dates:  Health Maintenance  Topic Date Due  . Complete foot exam   02/20/2017  . Tetanus Vaccine  01/06/2018  . Eye exam for diabetics  02/04/2018  . Hemoglobin A1C  04/27/2018  . Flu Shot  07/02/2018  . Colon Cancer Screening  07/08/2026  .  Hepatitis C: One time screening is recommended by Center for Disease Control  (CDC) for  adults born from [birth year] through 1965.   Completed  . Pneumonia vaccines  Completed      Fall Prevention in the Home Falls can cause injuries. They can happen to people of all ages. There are many things you can do to make your home safe and to help prevent falls. What can I do on the outside of my home?  Regularly fix the edges of walkways and driveways and fix any cracks.  Remove anything that might make you trip as you walk through a door, such as a raised step or threshold.  Trim any bushes  or trees on the path to your home.  Use bright outdoor lighting.  Clear any walking paths of anything that might make someone trip, such as rocks or tools.  Regularly check to see if handrails are loose or broken. Make sure that both sides of any steps have handrails.  Any raised decks and porches should have guardrails on the edges.  Have any leaves, snow, or ice cleared regularly.  Use sand or salt on walking paths during winter.  Clean up any spills in your garage right away. This includes oil or grease spills. What can I do in the bathroom?  Use night lights.  Install grab bars by the toilet and in the tub and shower. Do not use towel bars as grab bars.  Use non-skid mats or decals in the tub or shower.  If you need to sit down in the shower, use a  plastic, non-slip stool.  Keep the floor dry. Clean up any water that spills on the floor as soon as it happens.  Remove soap buildup in the tub or shower regularly.  Attach bath mats securely with double-sided non-slip rug tape.  Do not have throw rugs and other things on the floor that can make you trip. What can I do in the bedroom?  Use night lights.  Make sure that you have a light by your bed that is easy to reach.  Do not use any sheets or blankets that are too big for your bed. They should not hang down onto the floor.  Have a firm chair that has side arms. You can use this for support while you get dressed.  Do not have throw rugs and other things on the floor that can make you trip. What can I do in the kitchen?  Clean up any spills right away.  Avoid walking on wet floors.  Keep items that you use a lot in easy-to-reach places.  If you need to reach something above you, use a strong step stool that has a grab bar.  Keep electrical cords out of the way.  Do not use floor polish or wax that makes floors slippery. If you must use wax, use non-skid floor wax.  Do not have throw rugs and other things on the floor that can make you trip. What can I do with my stairs?  Do not leave any items on the stairs.  Make sure that there are handrails on both sides of the stairs and use them. Fix handrails that are broken or loose. Make sure that handrails are as long as the stairways.  Check any carpeting to make sure that it is firmly attached to the stairs. Fix any carpet that is loose or worn.  Avoid having throw rugs at the top or bottom of the stairs. If you do have throw rugs, attach them to the floor with carpet tape.  Make sure that you have a light switch at the top of the stairs and the bottom of the stairs. If you do not have them, ask someone to add them for you. What else can I do to help prevent falls?  Wear shoes that: ? Do not have high heels. ? Have  rubber bottoms. ? Are comfortable and fit you well. ? Are closed at the toe. Do not wear sandals.  If you use a stepladder: ? Make sure that it is fully opened. Do not climb a closed stepladder. ? Make sure that both sides of the stepladder are locked  into place. ? Ask someone to hold it for you, if possible.  Clearly mark and make sure that you can see: ? Any grab bars or handrails. ? First and last steps. ? Where the edge of each step is.  Use tools that help you move around (mobility aids) if they are needed. These include: ? Canes. ? Walkers. ? Scooters. ? Crutches.  Turn on the lights when you go into a dark area. Replace any light bulbs as soon as they burn out.  Set up your furniture so you have a clear path. Avoid moving your furniture around.  If any of your floors are uneven, fix them.  If there are any pets around you, be aware of where they are.  Review your medicines with your doctor. Some medicines can make you feel dizzy. This can increase your chance of falling. Ask your doctor what other things that you can do to help prevent falls. This information is not intended to replace advice given to you by your health care provider. Make sure you discuss any questions you have with your health care provider. Document Released: 09/14/2009 Document Revised: 04/25/2016 Document Reviewed: 12/23/2014 Elsevier Interactive Patient Education  2018 [employer]   Health Maintenance, Male A healthy lifestyle and preventive care is important for your health and wellness. Ask your health care provider about what schedule of regular examinations is right for you. What should I know about weight and diet? Eat a Healthy Diet  Eat plenty of vegetables, fruits, whole grains, low-fat dairy products, and lean protein.  Do not eat a lot of foods high in solid fats, added sugars, or salt.  Maintain a Healthy Weight Regular exercise can help you achieve or maintain a healthy weight.  You should:  Do at least 150 minutes of exercise each week. The exercise should increase your heart rate and make you sweat (moderate-intensity exercise).  Do strength-training exercises at least twice a week.  Watch Your Levels of Cholesterol and Blood Lipids  Have your blood tested for lipids and cholesterol every 5 years starting at 73 years of age. If you are at high risk for heart disease, you should start having your blood tested when you are 73 years old. You may need to have your cholesterol levels checked more often if: ? Your lipid or cholesterol levels are high. ? You are older than 73 years of age. ? You are at high risk for heart disease.  What should I know about cancer screening? Many types of cancers can be detected early and may often be prevented. Lung Cancer  You should be screened every year for lung cancer if: ? You are a current smoker who has smoked for at least 30 years. ? You are a former smoker who has quit within the past 15 years.  Talk to your health care provider about your screening options, when you should start screening, and how often you should be screened.  Colorectal Cancer  Routine colorectal cancer screening usually begins at 73 years of age and should be repeated every 5-10 years until you are 73 years old. You may need to be screened more often if early forms of precancerous polyps or small growths are found. Your health care provider may recommend screening at an earlier age if you have risk factors for colon cancer.  Your health care provider may recommend using home test kits to check for hidden blood in the stool.  A small camera at the end of a  tube can be used to examine your colon (sigmoidoscopy or colonoscopy). This checks for the earliest forms of colorectal cancer.  Prostate and Testicular Cancer  Depending on your age and overall health, your health care provider may do certain tests to screen for prostate and testicular  cancer.  Talk to your health care provider about any symptoms or concerns you have about testicular or prostate cancer.  Skin Cancer  Check your skin from head to toe regularly.  Tell your health care provider about any new moles or changes in moles, especially if: ? There is a change in a mole's size, shape, or color. ? You have a mole that is larger than a pencil eraser.  Always use sunscreen. Apply sunscreen liberally and repeat throughout the day.  Protect yourself by wearing long sleeves, pants, a wide-brimmed hat, and sunglasses when outside.  What should I know about heart disease, diabetes, and high blood pressure?  If you are 68-24 years of age, have your blood pressure checked every 3-5 years. If you are 55 years of age or older, have your blood pressure checked every year. You should have your blood pressure measured twice-once when you are at a hospital or clinic, and once when you are not at a hospital or clinic. Record the average of the two measurements. To check your blood pressure when you are not at a hospital or clinic, you can use: ? An automated blood pressure machine at a pharmacy. ? A home blood pressure monitor.  Talk to your health care provider about your target blood pressure.  If you are between 41-90 years old, ask your health care provider if you should take aspirin to prevent heart disease.  Have regular diabetes screenings by checking your fasting blood sugar level. ? If you are at a normal weight and have a low risk for diabetes, have this test once every three years after the age of 24. ? If you are overweight and have a high risk for diabetes, consider being tested at a younger age or more often.  A one-time screening for abdominal aortic aneurysm (AAA) by ultrasound is recommended for men aged 65-75 years who are current or former smokers. What should I know about preventing infection? Hepatitis B If you have a higher risk for hepatitis B, you  should be screened for this virus. Talk with your health care provider to find out if you are at risk for hepatitis B infection. Hepatitis C Blood testing is recommended for:  Everyone born from [birth year] through 1966.  Anyone with known risk factors for hepatitis C.  Sexually Transmitted Diseases (STDs)  You should be screened each year for STDs including gonorrhea and chlamydia if: ? You are sexually active and are younger than 73 years of age. ? You are older than 73 years of age and your health care provider tells you that you are at risk for this type of infection. ? Your sexual activity has changed since you were last screened and you are at an increased risk for chlamydia or gonorrhea. Ask your health care provider if you are at risk.  Talk with your health care provider about whether you are at high risk of being infected with HIV. Your health care provider may recommend a prescription medicine to help prevent HIV infection.  What else can I do?  Schedule regular health, dental, and eye exams.  Stay current with your vaccines (immunizations).  Do not use any tobacco products, such as cigarettes, chewing tobacco,  and e-cigarettes. If you need help quitting, ask your health care provider.  Limit alcohol intake to no more than 2 drinks per day. One drink equals 12 ounces of beer, 5 ounces of wine, or 1 ounces of hard liquor.  Do not use street drugs.  Do not share needles.  Ask your health care provider for help if you need support or information about quitting drugs.  Tell your health care provider if you often feel depressed.  Tell your health care provider if you have ever been abused or do not feel safe at home. This information is not intended to replace advice given to you by your health care provider. Make sure you discuss any questions you have with your health care provider. Document Released: 05/16/2008 Document Revised: 07/17/2016 Document Reviewed:  08/22/2015 Elsevier Interactive Patient Education  2018 [employer].   Hearing Loss Hearing loss is a partial or total loss of the ability to hear. This can be temporary or permanent, and it can happen in one or both ears. Hearing loss may be referred to as deafness. Medical care is necessary to treat hearing loss properly and to prevent the condition from getting worse. Your hearing may partially or completely come back, depending on what caused your hearing loss and how severe it is. In some cases, hearing loss is permanent. What are the causes? Common causes of hearing loss include:  Too much wax in the ear canal.  Infection of the ear canal or middle ear.  Fluid in the middle ear.  Injury to the ear or surrounding area.  An object stuck in the ear.  Prolonged exposure to loud sounds, such as music.  Less common causes of hearing loss include:  Tumors in the ear.  Viral or bacterial infections, such as meningitis.  A hole in the eardrum (perforated eardrum).  Problems with the hearing nerve that sends signals between the brain and the ear.  Certain medicines.  What are the signs or symptoms? Symptoms of this condition may include:  Difficulty telling the difference between sounds.  Difficulty following a conversation when there is background noise.  Lack of response to sounds in your environment. This may be most noticeable when you do not respond to startling sounds.  Needing to turn up the volume on the television, radio, etc.  Ringing in the ears.  Dizziness.  Pain in the ears.  How is this diagnosed? This condition is diagnosed based on a physical exam and a hearing test (audiometry). The audiometry test will be performed by a hearing specialist (audiologist). You may also be referred to an ear, nose, and throat (ENT) specialist (otolaryngologist). How is this treated? Treatment for recent onset of hearing loss may include:  Ear wax removal.  Being  prescribed medicines to prevent infection (antibiotics).  Being prescribed medicines to reduce inflammation (corticosteroids).  Follow these instructions at home:  If you were prescribed an antibiotic medicine, take it as told by your health care provider. Do not stop taking the antibiotic even if you start to feel better.  Take over-the-counter and prescription medicines only as told by your health care provider.  Avoid loud noises.  Return to your normal activities as told by your health care provider. Ask your health care provider what activities are safe for you.  Keep all follow-up visits as told by your health care provider. This is important. Contact a health care provider if:  You feel dizzy.  You develop new symptoms.  You vomit or  feel nauseous.  You have a fever. Get help right away if:  You develop sudden changes in your vision.  You have severe ear pain.  You have new or increased weakness.  You have a severe headache. This information is not intended to replace advice given to you by your health care provider. Make sure you discuss any questions you have with your health care provider. Document Released: 11/18/2005 Document Revised: 04/25/2016 Document Reviewed: 04/05/2015 Elsevier Interactive Patient Education  2018 [employer].

## 2018-03-15 NOTE — Telephone Encounter (Signed)
Have him hold the Lipitor for one month.  If ankle pain does not go away in one month, go ahead and start statin back.  If does go away with discontinuation let me know.  We could then look at possible alternative statin.

## 2018-03-16 NOTE — Telephone Encounter (Signed)
Patient is aware and will call back as needed. 

## 2018-04-15 LAB — HM DIABETES EYE EXAM

## 2018-04-17 ENCOUNTER — Encounter: Payer: Self-pay | Admitting: Family Medicine

## 2018-05-01 ENCOUNTER — Other Ambulatory Visit: Payer: Self-pay | Admitting: Family Medicine

## 2018-08-26 ENCOUNTER — Other Ambulatory Visit: Payer: Self-pay | Admitting: Family Medicine

## 2018-10-20 ENCOUNTER — Encounter: Payer: Self-pay | Admitting: Family Medicine

## 2018-10-20 ENCOUNTER — Telehealth: Payer: Self-pay | Admitting: Family Medicine

## 2018-10-20 ENCOUNTER — Ambulatory Visit (HOSPITAL_COMMUNITY)
Admission: RE | Admit: 2018-10-20 | Discharge: 2018-10-20 | Disposition: A | Discharge status: Home / Self Care | Payer: Medicare Other | Source: Ambulatory Visit | Attending: Family Medicine | Admitting: Family Medicine

## 2018-10-20 ENCOUNTER — Ambulatory Visit: Payer: Medicare Other | Admitting: Family Medicine

## 2018-10-20 ENCOUNTER — Other Ambulatory Visit: Payer: Self-pay

## 2018-10-20 ENCOUNTER — Ambulatory Visit: Payer: Self-pay

## 2018-10-20 VITALS — BP 116/74 | HR 75 | Temp 98.0°F | Ht 66.5 in | Wt 224.7 lb

## 2018-10-20 DIAGNOSIS — R1011 Right upper quadrant pain: Secondary | ICD-10-CM

## 2018-10-20 DIAGNOSIS — N281 Cyst of kidney, acquired: Secondary | ICD-10-CM | POA: Diagnosis not present

## 2018-10-20 DIAGNOSIS — K219 Gastro-esophageal reflux disease without esophagitis: Secondary | ICD-10-CM

## 2018-10-20 DIAGNOSIS — K769 Liver disease, unspecified: Secondary | ICD-10-CM | POA: Diagnosis not present

## 2018-10-20 LAB — COMPREHENSIVE METABOLIC PANEL
ALT: 16 U/L (ref 0–53)
AST: 12 U/L (ref 0–37)
Albumin: 4.7 g/dL (ref 3.5–5.2)
Alkaline Phosphatase: 45 U/L (ref 39–117)
BILIRUBIN TOTAL: 0.5 mg/dL (ref 0.2–1.2)
BUN: 24 mg/dL — ABNORMAL HIGH (ref 6–23)
CO2: 30 mEq/L (ref 19–32)
Calcium: 10.2 mg/dL (ref 8.4–10.5)
Chloride: 102 mEq/L (ref 96–112)
Creatinine, Ser: 1.03 mg/dL (ref 0.40–1.50)
GFR: 75.06 mL/min (ref >60.00)
GLUCOSE: 124 mg/dL — AB (ref 70–99)
POTASSIUM: 4.8 meq/L (ref 3.5–5.1)
Sodium: 140 mEq/L (ref 135–145)
TOTAL PROTEIN: 6.7 g/dL (ref 6.0–8.3)

## 2018-10-20 LAB — CBC WITH DIFFERENTIAL/PLATELET
Basophils Absolute: 0.1 10*3/uL (ref 0.0–0.1)
Basophils Relative: 0.7 % (ref 0.0–3.0)
Eosinophils Absolute: 0.2 10*3/uL (ref 0.0–0.7)
Eosinophils Relative: 2.1 % (ref 0.0–5.0)
HCT: 41.4 % (ref 39.0–52.0)
Hemoglobin: 14.1 g/dL (ref 13.0–17.0)
LYMPHS ABS: 2.7 10*3/uL (ref 0.7–4.0)
Lymphocytes Relative: 30.5 % (ref 12.0–46.0)
MCHC: 34.1 g/dL (ref 30.0–36.0)
MCV: 88 fl (ref 78.0–100.0)
MONO ABS: 0.6 10*3/uL (ref 0.1–1.0)
MONOS PCT: 7.2 % (ref 3.0–12.0)
NEUTROS PCT: 59.5 % (ref 43.0–77.0)
Neutro Abs: 5.2 10*3/uL (ref 1.4–7.7)
PLATELETS: 281 10*3/uL (ref 150.0–400.0)
RBC: 4.71 Mil/uL (ref 4.22–5.81)
RDW: 14.7 % (ref 11.5–15.5)
WBC: 8.7 10*3/uL (ref 4.0–10.5)

## 2018-10-20 LAB — LIPASE: Lipase: 18 U/L (ref 11.0–59.0)

## 2018-10-20 NOTE — Telephone Encounter (Signed)
FYI

## 2018-10-20 NOTE — Telephone Encounter (Signed)
I called and spoke with him re: Ultrasound and labs.  He will start Pepcid 20 mg bid and be in touch if pain no better in one week and sooner as needed.

## 2018-10-20 NOTE — Telephone Encounter (Signed)
Called patient and let them know that Dr. BurcCaryl Neverhette stated that he will call them later today to go over US. Patient verbalized an understanding.

## 2018-10-20 NOTE — Patient Instructions (Signed)

## 2018-10-20 NOTE — Progress Notes (Signed)
  Subjective:     Patient ID: Clifford Gibson, male   DOB: [birth year], 72 y.o.   MRN: [id number]  HPI Patient seen with 1 week history of right upper quadrant abdominal pain.  He describes some "tenderness" right upper quadrant which is relatively continuous and has had some sharp shooting pains intermittently with most recent episode Sunday night.  Location is up under the rib cage on the right side.  He has not had any nausea or vomiting.  No fevers or chills.  No cough.  No pleuritic pain.  No change in bowel habits.  Possibly some mild radiation toward the back.  No history of known gallbladder disease.  Denies any dyspnea or chest pains.  No recent skin rash.  No alleviating factors.  He has history of GERD and had been taking Zantac.  has been off that for a few weeks and has had some recent reflux symptoms.  Past Medical History:  Diagnosis Date  . Acute serous otitis media 02/22/2010  . Arthritis   . Diabetes mellitus without complication (HCC)   . ERECTILE DYSFUNCTION 04/25/2009  . GERD 03/24/2009  . HYPERLIPIDEMIA 03/28/2010  . HYPERTENSION 03/24/2009  . OBESITY, MODERATE 04/25/2009  . PONV (postoperative nausea and vomiting)    Past Surgical History:  Procedure Laterality Date  . COLONOSCOPY N/A 06/21/2015   Procedure: COLONOSCOPY;  Surgeon: Malissa HippoNajeeb U Rehman, MD;  Location: [facility];  Service: Endoscopy;  Laterality: N/A;  930 - moved to 7/20 @ 7:30  . NASAL SEPTUM SURGERY  1974  . TONSILLECTOMY     3 yrs. old  . TOTAL KNEE ARTHROPLASTY  06/11/2012   Procedure: TOTAL KNEE ARTHROPLASTY;  Surgeon: Javier DockerJeffrey C Beane, MD;  Location: [facility];  Service: Orthopedics;  Laterality: Right;    reports that he has never smoked. He has never used smokeless tobacco. He reports that he does not drink alcohol or use drugs. family history includes Dementia in his father; Scleroderma in his mother. Allergies  Allergen Reactions  . Penicillins Other (See Comments)    Reaction=bleeding,diarrhea      Review of Systems  Constitutional: Negative for chills and fever.  HENT: Negative for trouble swallowing.   Respiratory: Negative for cough and shortness of breath.   Cardiovascular: Negative for chest pain.  Gastrointestinal: Positive for abdominal pain. Negative for abdominal distention, blood in stool, constipation, diarrhea, nausea and vomiting.  Skin: Negative for rash.  Neurological: Negative for dizziness.       Objective:   Physical Exam  Constitutional: He appears well-developed and well-nourished.  Cardiovascular: Normal rate and regular rhythm.  Pulmonary/Chest: Effort normal and breath sounds normal.  Abdominal: Normal appearance and bowel sounds are normal. He exhibits no distension, no ascites and no mass.  He has some tenderness right upper quadrant to deep palpation.  No guarding or rebound.  No mass palpated.  No hepatomegaly.       Assessment:     #1 Abdominal pain right upper quadrant.  Rule out symptomatic gallstones  #2 GERD-increased symptoms since discontinuation of Zantac    Plan:     -Check labs with CBC, comprehensive metabolic panel, lipase -Set up ultrasound right upper quadrant to further assess -Follow-up immediately for any fever, progressive abdominal pain, recurrent vomiting -Suggested starting Pepcid 20 mg twice daily  Clifford CoveyBruce W Cortland Crehan MD [facility]

## 2018-10-20 NOTE — Telephone Encounter (Signed)
rec'd call from pt. With c/o right upper quadrant abdominal "tenderness" x 1 wk.  Stated pain is located just under the base of right rib cage. Reported tenderness is present consistently, and has occasional sharp stabbing pain in same area.  Denied any radiation of pain.  Denied chest pain, shortness of breath, nausea/vomiting.  Stated has had increase in gas on stomach, with increased belching.  Denied any fever/ chills.  Stated bowel movements are regular and normal.  Has noted that spicy foods will aggravate the pain.  Reported taking Tums intermittently for the discomfort.    Appt. Sched. With PCP at 11:40 AM.  Advised to remain NPO for any possible diagnostic tests.  Pt. Verb. Understanding. Agrees with plan.     Reason for Disposition . [1] MILD-MODERATE pain AND [2] constant AND [3] present > 2 hours  Answer Assessment - Initial Assessment Questions 1. LOCATION: "Where does it hurt?"      Just underneath the rib cage, down; tender to the touch 2. RADIATION: "Does the pain shoot anywhere else?" (e.g., chest, back)     Denied radiation of pain  3. ONSET: "When did the pain begin?" (e.g., minutes, hours or days ago)      About one week ago; woke up with tenderness  4. SUDDEN: "Gradual or sudden onset?"     Sudden onset of tenderness x 7-8 days.  5. PATTERN "Does the pain come and go, or is it constant?"    - If constant: "Is it getting better, staying the same, or worsening?"      (Note: Constant means the pain never goes away completely; most serious pain is constant and it progresses)     - If intermittent: "How long does it last?" "Do you have pain now?"     (Note: Intermittent means the pain goes away completely between bouts)     Intermittent/ periodic stabbing pain 6. SEVERITY: "How bad is the pain?"  (e.g., Scale 1-10; mild, moderate, or severe)    - MILD (1-3): doesn't interfere with normal activities, abdomen soft and not tender to touch     - MODERATE (4-7): interferes with  normal activities or awakens from sleep, tender to touch     - SEVERE (8-10): excruciating pain, doubled over, unable to do any normal activities       Tender consistently at 4/10; increases with occasional stabbing pain at 10/10  7. RECURRENT SYMPTOM: "Have you ever had this type of abdominal pain before?" If so, ask: "When was the last time?" and "What happened that time?"      Denied previous episodes 8. AGGRAVATING FACTORS: "Does anything seem to cause this pain?" (e.g., foods, stress, alcohol)     Spicy food aggravate it  9. CARDIAC SYMPTOMS: "Do you have any of the following symptoms: chest pain, difficulty breathing, sweating, nausea?"     Denied the above  10. OTHER SYMPTOMS: "Do you have any other symptoms?" (e.g., fever, vomiting, diarrhea)       Denied fever/ chills, nausea/ vomiting; does feel increased gas through burping; using Tums.  BM's are regular  Protocols used: ABDOMINAL PAIN - UPPER-A-AH

## 2018-10-20 NOTE — Telephone Encounter (Signed)
FYI, on schedule for this morning. 

## 2018-10-20 NOTE — Telephone Encounter (Signed)
Helmut Musterlicia from [facility] called to give verbal report on pt's ultrasound of the abdomen. Helmut Musterlicia stated that pt's gall bladder is normal,  has fatty liver and multiple renal cysts largest measuring 6.3 x 5.0 x 5.6 centimeters.  Routing to office.

## 2018-11-09 ENCOUNTER — Other Ambulatory Visit: Payer: Self-pay

## 2018-11-09 ENCOUNTER — Ambulatory Visit: Payer: Medicare Other | Admitting: Family Medicine

## 2018-11-09 ENCOUNTER — Encounter: Payer: Self-pay | Admitting: Family Medicine

## 2018-11-09 VITALS — BP 100/64 | HR 73 | Temp 98.4°F | Ht 66.5 in | Wt 220.4 lb

## 2018-11-09 DIAGNOSIS — R1011 Right upper quadrant pain: Secondary | ICD-10-CM | POA: Diagnosis not present

## 2018-11-09 MED ORDER — SUCRALFATE 1 G PO TABS: 1.0000 g | ORAL_TABLET | Freq: Three times a day (TID) | ORAL | 1 refills | Status: DC

## 2018-11-09 NOTE — Patient Instructions (Signed)
We are setting up GI referral to Dr Karilyn Cotaehman  Follow up immediately for any recurrent vomiting, worsening pain, fever, or other concern.

## 2018-11-09 NOTE — Progress Notes (Signed)
Subjective:     Patient ID: Clifford Gibson, male   DOB: 24-Mar-1946, 72 y.o.   MRN: [id number]  HPI Patient is seen for follow-up regarding abdominal pain right upper quadrant.  He was seen for this here on 19th of November.  Refer to that note for details  "Patient seen with 1 week history of right upper quadrant abdominal pain.  He describes some "tenderness" right upper quadrant which is relatively continuous and has had some sharp shooting pains intermittently with most recent episode Sunday night.  Location is up under the rib cage on the right side.  He has not had any nausea or vomiting.  No fevers or chills.  No cough.  No pleuritic pain.  No change in bowel habits.  Possibly some mild radiation toward the back.  No history of known gallbladder disease.  Denies any dyspnea or chest pains.  No recent skin rash.  No alleviating factors.  He has history of GERD and had been taking Zantac.  has been off that for a few weeks and has had some recent reflux symptoms."  Lab work including CBC, comprehensive metabolic panel, lipase unremarkable.  Right upper quadrant abdominal ultrasound revealed no gallstones.  Probably some fatty liver changes.  No other acute abnormality.  Pain is somewhat intermittent but he has some constant background soreness right upper quadrant.  No cough.  No dyspnea.  No pleuritic pain.  Denies any nausea or vomiting.  Taking Pepcid twice daily.  He has a more sharp intense pain that is somewhat fleeting.  No radiation.  No skin rash.  No dysphagia.  Appetite and weight stable.  No fever.  No melena  Symptoms sometimes exacerbated by foods but not consistently.  Has not tried any medications other than Pepcid  Past Medical History:  Diagnosis Date  . Acute serous otitis media 02/22/2010  . Arthritis   . Diabetes mellitus without complication (HCC)   . ERECTILE DYSFUNCTION 04/25/2009  . GERD 03/24/2009  . HYPERLIPIDEMIA 03/28/2010  . HYPERTENSION 03/24/2009  .  OBESITY, MODERATE 04/25/2009  . PONV (postoperative nausea and vomiting)    Past Surgical History:  Procedure Laterality Date  . COLONOSCOPY N/A 06/21/2015   Procedure: COLONOSCOPY;  Surgeon: Malissa Hippo, MD;  Location: [facility];  Service: Endoscopy;  Laterality: N/A;  930 - moved to 7/20 @ 7:30  . NASAL SEPTUM SURGERY  1974  . TONSILLECTOMY     3 yrs. old  . TOTAL KNEE ARTHROPLASTY  06/11/2012   Procedure: TOTAL KNEE ARTHROPLASTY;  Surgeon: Javier Docker, MD;  Location: [facility];  Service: Orthopedics;  Laterality: Right;    reports that he has never smoked. He has never used smokeless tobacco. He reports that he does not drink alcohol or use drugs. family history includes Dementia in his father; Scleroderma in his mother. Allergies  Allergen Reactions  . Penicillins Other (See Comments)    Reaction=bleeding,diarrhea     Review of Systems  Constitutional: Negative for appetite change, chills and fever.  HENT: Negative for trouble swallowing.   Respiratory: Negative for shortness of breath.   Cardiovascular: Negative for chest pain.  Gastrointestinal: Positive for abdominal pain. Negative for blood in stool, constipation, diarrhea, nausea and vomiting.       Objective:   Physical Exam  Constitutional: He appears well-developed and well-nourished.  Abdominal: Normal appearance and bowel sounds are normal. He exhibits no mass. There is no hepatomegaly. There is no rigidity, no rebound and no guarding. No  hernia.  Minimally tender right upper quadrant to deep palpation.       Assessment:     Patient presents with few week history of persistent right upper quadrant pain.  No evidence for gallstones on recent ultrasound.  Nonacute abdomen.  Recent lab work unremarkable.  Does not any red flags such as appetite or weight change, melena, etc.    Plan:     -Continue Pepcid twice daily -We discussed trial of empiric Carafate 1 g 4 times daily -Set up GI referral for  further evaluation  Kristian CoveyBruce W Burchette MD [facility]

## 2018-11-13 ENCOUNTER — Telehealth: Payer: Self-pay | Admitting: *Deleted

## 2018-11-13 NOTE — Telephone Encounter (Signed)
Copied from CRM [phone]. Topic: General - Other >> Nov 12, 2018  5:04 PM Trula SladeWalter, Linda F wrote: Reason for CRM:  Patient will have a dental appt on Mon 11/16/18 and since he has had heart problems the dentist would like to know if he needs to take antibiotics before his deep cleaning. Please advise, because if so he will need a prescription for the antibiotics.

## 2018-11-13 NOTE — Telephone Encounter (Signed)
Called patient and gave him message from Dr. Burchette. Patient verbalized an understanding. 

## 2018-11-13 NOTE — Telephone Encounter (Signed)
No.  Hx of joint replacement has not been indicated as high risk condition for prophylaxis.

## 2018-11-13 NOTE — Telephone Encounter (Signed)
Please see message.  Please advise. 

## 2018-11-16 ENCOUNTER — Other Ambulatory Visit: Payer: Self-pay | Admitting: Family Medicine

## 2018-11-30 ENCOUNTER — Encounter (INDEPENDENT_AMBULATORY_CARE_PROVIDER_SITE_OTHER): Payer: Self-pay | Admitting: Internal Medicine

## 2018-11-30 ENCOUNTER — Ambulatory Visit (INDEPENDENT_AMBULATORY_CARE_PROVIDER_SITE_OTHER): Payer: Medicare Other | Admitting: Internal Medicine

## 2018-11-30 DIAGNOSIS — R1011 Right upper quadrant pain: Secondary | ICD-10-CM

## 2018-11-30 NOTE — Patient Instructions (Signed)
HIDA scan.

## 2018-11-30 NOTE — Progress Notes (Signed)
Subjective:    Patient ID: Clifford Gibson, male    DOB: 07-26-1946, [age] y.o.   MRN: [id number]  HPI Referred by Dr. Caryl NeverBurchette for RUQ pain. Underwent a US RUQ which was normal. No gallstones. CBD 4mm. Hepatic function was normal. CBC was normal.  No nausea or vomiting. No fever or chills. No change in bowel habits.  Symptoms for about 2 months. Pain under rt rib cage. No injury. The pain is not constant. Pain after eating something spicy. The tenderness is constant. Greasy foods do not bother him. He sat he ate some stew and it bother him.  His appetite is good. No weight loss. BMs are normal. No change in his stools. No known injury.   Diabetic x 2 years.  Colonoscopy in 2016 was normal by Dr. Karilyn Cotaehman (average risk).    Review of Systems Past Medical History:  Diagnosis Date  . Acute serous otitis media 02/22/2010  . Arthritis   . Diabetes mellitus without complication (HCC)   . ERECTILE DYSFUNCTION 04/25/2009  . GERD 03/24/2009  . HYPERLIPIDEMIA 03/28/2010  . HYPERTENSION 03/24/2009  . OBESITY, MODERATE 04/25/2009  . PONV (postoperative nausea and vomiting)     Past Surgical History:  Procedure Laterality Date  . COLONOSCOPY N/A 06/21/2015   Procedure: COLONOSCOPY;  Surgeon: Malissa HippoNajeeb U Rehman, MD;  Location: [facility];  Service: Endoscopy;  Laterality: N/A;  930 - moved to 7/20 @ 7:30  . NASAL SEPTUM SURGERY  1974  . TONSILLECTOMY     3 yrs. old  . TOTAL KNEE ARTHROPLASTY  06/11/2012   Procedure: TOTAL KNEE ARTHROPLASTY;  Surgeon: Javier DockerJeffrey C Beane, MD;  Location: [facility];  Service: Orthopedics;  Laterality: Right;    Allergies  Allergen Reactions  . Penicillins Other (See Comments)    Reaction=bleeding,diarrhea    Current Outpatient Medications on File Prior to Visit  Medication Sig Dispense Refill  . ACCU-CHEK FASTCLIX LANCETS MISC Test once daily Dx E11.9 100 each 3  . aspirin EC 81 MG tablet Take 81 mg by mouth daily.    Marland Kitchen. atorvastatin (LIPITOR) 10 MG tablet TAKE  1 TABLET DAILY 90 tablet 0  . Blood Glucose Monitoring Suppl (ACCU-CHEK AVIVA) device Use as instructed 1 each 0  . fluticasone (FLONASE) 50 MCG/ACT nasal spray 1 SPRAY NASALLY ONCE A DAY 48 g 1  . glucose blood (ACCU-CHEK AVIVA) test strip Test once daily. E11.9 100 each 12  . Magnesium 400 MG CAPS Take 1 tablet by mouth daily.    . metFORMIN (GLUCOPHAGE) 500 MG tablet TAKE 1 TABLET 2 TIMES A DAY WITH A MEAL 180 tablet 0  . sildenafil (VIAGRA) 100 MG tablet Take 100 mg by mouth daily as needed. For ED    . sucralfate (CARAFATE) 1 g tablet Take 1 tablet (1 g total) by mouth 4 (four) times daily -  with meals and at bedtime. 60 tablet 1  . valsartan-hydrochlorothiazide (DIOVAN-HCT) 160-12.5 MG tablet TAKE ONE TABLET AT BEDTIME 90 tablet 0   No current facility-administered medications on file prior to visit.         Objective:   Physical Exam Blood pressure (!) 146/80, pulse 60, temperature 98 F (36.7 C), height 5\' 8"  (1.727 m), weight 221 lb 1.6 oz (100.3 kg).  Alert and oriented. Skin warm and dry. Oral mucosa is moist.   . Sclera anicteric, conjunctivae is pink. Thyroid not enlarged. No cervical lymphadenopathy. Lungs clear. Heart regular rate and rhythm.  Abdomen is soft. Bowel sounds  are positive. No hepatomegaly. No abdominal masses felt. No tenderness.  No edema to lower extremities.         Assessment & Plan:  RUQ pain. ? Etiology. Normal US and Hepatic. Will check function of gallbladder. (Hida scan) Further recommendations to follow.

## 2018-12-01 ENCOUNTER — Emergency Department (HOSPITAL_COMMUNITY): Payer: Medicare Other

## 2018-12-01 ENCOUNTER — Other Ambulatory Visit: Payer: Self-pay

## 2018-12-01 ENCOUNTER — Emergency Department (HOSPITAL_COMMUNITY)
Admission: EM | Admit: 2018-12-01 | Discharge: 2018-12-01 | Disposition: A | Discharge status: Home / Self Care | Payer: Medicare Other | Attending: Emergency Medicine | Admitting: Emergency Medicine

## 2018-12-01 ENCOUNTER — Encounter (HOSPITAL_COMMUNITY): Payer: Self-pay

## 2018-12-01 DIAGNOSIS — W298XXA Contact with other powered powered hand tools and household machinery, initial encounter: Secondary | ICD-10-CM | POA: Insufficient documentation

## 2018-12-01 DIAGNOSIS — E119 Type 2 diabetes mellitus without complications: Secondary | ICD-10-CM | POA: Insufficient documentation

## 2018-12-01 DIAGNOSIS — Z7984 Long term (current) use of oral hypoglycemic drugs: Secondary | ICD-10-CM | POA: Insufficient documentation

## 2018-12-01 DIAGNOSIS — Z79899 Other long term (current) drug therapy: Secondary | ICD-10-CM | POA: Diagnosis not present

## 2018-12-01 DIAGNOSIS — Y929 Unspecified place or not applicable: Secondary | ICD-10-CM | POA: Diagnosis not present

## 2018-12-01 DIAGNOSIS — S62521B Displaced fracture of distal phalanx of right thumb, initial encounter for open fracture: Secondary | ICD-10-CM | POA: Diagnosis not present

## 2018-12-01 DIAGNOSIS — S6991XA Unspecified injury of right wrist, hand and finger(s), initial encounter: Secondary | ICD-10-CM | POA: Diagnosis present

## 2018-12-01 DIAGNOSIS — S62639B Displaced fracture of distal phalanx of unspecified finger, initial encounter for open fracture: Secondary | ICD-10-CM

## 2018-12-01 DIAGNOSIS — Z7982 Long term (current) use of aspirin: Secondary | ICD-10-CM | POA: Diagnosis not present

## 2018-12-01 DIAGNOSIS — Y999 Unspecified external cause status: Secondary | ICD-10-CM | POA: Diagnosis not present

## 2018-12-01 DIAGNOSIS — Y9389 Activity, other specified: Secondary | ICD-10-CM | POA: Insufficient documentation

## 2018-12-01 DIAGNOSIS — S61111A Laceration without foreign body of right thumb with damage to nail, initial encounter: Secondary | ICD-10-CM

## 2018-12-01 DIAGNOSIS — I10 Essential (primary) hypertension: Secondary | ICD-10-CM | POA: Diagnosis not present

## 2018-12-01 DIAGNOSIS — Z23 Encounter for immunization: Secondary | ICD-10-CM | POA: Insufficient documentation

## 2018-12-01 MED ORDER — CEPHALEXIN 500 MG PO CAPS
500.0000 mg | ORAL_CAPSULE | Freq: Once | ORAL | Status: AC
  Administered 2018-12-01: 500 mg via ORAL
  Filled 2018-12-01: qty 1

## 2018-12-01 MED ORDER — CEPHALEXIN 500 MG PO CAPS: 500.0000 mg | ORAL_CAPSULE | Freq: Two times a day (BID) | ORAL | 0 refills | Status: DC

## 2018-12-01 MED ORDER — TETANUS-DIPHTH-ACELL PERTUSSIS 5-2.5-18.5 LF-MCG/0.5 IM SUSP
0.5000 mL | Freq: Once | INTRAMUSCULAR | Status: AC
  Administered 2018-12-01: 0.5 mL via INTRAMUSCULAR
  Filled 2018-12-01: qty 0.5

## 2018-12-01 MED ORDER — POVIDONE-IODINE 10 % EX SOLN
CUTANEOUS | Status: AC
  Administered 2018-12-01: 16:00:00
  Filled 2018-12-01: qty 15

## 2018-12-01 MED ORDER — LIDOCAINE-EPINEPHRINE (PF) 2 %-1:200000 IJ SOLN
10.0000 mL | Freq: Once | INTRAMUSCULAR | Status: AC
  Administered 2018-12-01: 10 mL
  Filled 2018-12-01: qty 10

## 2018-12-01 NOTE — ED Provider Notes (Addendum)
[facility] EMERGENCY DEPARTMENT Provider Note   CSN: [id number] Arrival date & time: 12/01/18  1313     History   Chief Complaint Chief Complaint  Patient presents with  . Laceration    HPI Clifford Gibson is a 72 y.o. male.  HPI  72 year old man who injured his dominant right thumb today with a table saw.  He denies any other injury.  Is not sure his last tetanus shot was.  Not on any blood thinners.  Past Medical History:  Diagnosis Date  . Acute serous otitis media 02/22/2010  . Arthritis   . Diabetes mellitus without complication (HCC)   . ERECTILE DYSFUNCTION 04/25/2009  . GERD 03/24/2009  . HYPERLIPIDEMIA 03/28/2010  . HYPERTENSION 03/24/2009  . OBESITY, MODERATE 04/25/2009  . PONV (postoperative nausea and vomiting)     Patient Active Problem List   Diagnosis Date Noted  . Type 2 diabetes mellitus, controlled (HCC) 07/04/2015  . Hyperglycemia 02/02/2015  . Allergic rhinitis 05/14/2013  . Hyperlipemia 03/28/2010  . ACUTE SEROUS OTITIS MEDIA 02/22/2010  . OBESITY, MODERATE 04/25/2009  . ERECTILE DYSFUNCTION 04/25/2009  . Essential hypertension 03/24/2009  . GERD 03/24/2009    Past Surgical History:  Procedure Laterality Date  . COLONOSCOPY N/A 06/21/2015   Procedure: COLONOSCOPY;  Surgeon: Malissa Hippo, MD;  Location: [facility];  Service: Endoscopy;  Laterality: N/A;  930 - moved to 7/20 @ 7:30  . NASAL SEPTUM SURGERY  1974  . TONSILLECTOMY     3 yrs. old  . TOTAL KNEE ARTHROPLASTY  06/11/2012   Procedure: TOTAL KNEE ARTHROPLASTY;  Surgeon: Javier Docker, MD;  Location: [facility];  Service: Orthopedics;  Laterality: Right;        Home Medications    Prior to Admission medications   Medication Sig Start Date End Date Taking? Authorizing Provider  ACCU-CHEK FASTCLIX LANCETS MISC Test once daily Dx E11.9 03/10/18   Kristian Covey, MD  aspirin EC 81 MG tablet Take 81 mg by mouth daily.    [provider]  atorvastatin (LIPITOR) 10 MG  tablet TAKE 1 TABLET DAILY 11/16/18   Burchette, Elberta Fortis, MD  Blood Glucose Monitoring Suppl (ACCU-CHEK AVIVA) device Use as instructed 03/10/18 03/10/19  Burchette, Elberta Fortis, MD  fluticasone (FLONASE) 50 MCG/ACT nasal spray 1 SPRAY NASALLY ONCE A DAY 04/30/17   Burchette, Elberta Fortis, MD  glucose blood (ACCU-CHEK AVIVA) test strip Test once daily. E11.9 03/10/18   Burchette, Elberta Fortis, MD  Magnesium 400 MG CAPS Take 1 tablet by mouth daily.    [provider]  metFORMIN (GLUCOPHAGE) 500 MG tablet TAKE 1 TABLET 2 TIMES A DAY WITH A MEAL 08/26/18   Burchette, Elberta Fortis, MD  sildenafil (VIAGRA) 100 MG tablet Take 100 mg by mouth daily as needed. For ED    [provider]  sucralfate (CARAFATE) 1 g tablet Take 1 tablet (1 g total) by mouth 4 (four) times daily -  with meals and at bedtime. 11/09/18   Burchette, Elberta Fortis, MD  valsartan-hydrochlorothiazide (DIOVAN-HCT) 160-12.5 MG tablet TAKE ONE TABLET AT BEDTIME 11/16/18   Burchette, Elberta Fortis, MD    Family History Family History  Problem Relation Age of Onset  . Scleroderma Mother   . Dementia Father     Social History Social History   Tobacco Use  . Smoking status: Never Smoker  . Smokeless tobacco: Never Used  Substance Use Topics  . Alcohol use: No  . Drug use: No  Allergies   Penicillins   Review of Systems Review of Systems  All other systems reviewed and are negative.    Physical Exam Updated Vital Signs BP (!) 156/89   Pulse 87   Temp 97.9 F (36.6 C) (Oral)   Resp 20   SpO2 98%   Physical Exam Vitals signs and nursing note reviewed.  Constitutional:      Appearance: Normal appearance. He is well-developed.  HENT:     Head: Normocephalic and atraumatic.     Right Ear: External ear normal.     Left Ear: External ear normal.     Nose: Nose normal.  Neck:     Trachea: No tracheal deviation.  Pulmonary:     Effort: Pulmonary effort is normal.  Musculoskeletal: Normal range of motion.       Hands:      Comments: Skin defect with nail defect dorsal ulnar aspect right thumb Sensation intact Patient able to flex and extend at the inter-phalangeal joint Complex laceration involving right thumb and nail bed- 3 cm  Skin:    General: Skin is warm and dry.  Neurological:     Mental Status: He is alert and oriented to person, place, and time.  Psychiatric:        Behavior: Behavior normal.      ED Treatments / Results  Labs (all labs ordered are listed, but only abnormal results are displayed) Labs Reviewed - No data to display  EKG None      Radiology Dg Finger Thumb Right  Result Date: 12/01/2018 CLINICAL DATA:  72 year old male status post right thumb injury by table saw. EXAM: RIGHT THUMB 2+V COMPARISON:  None. FINDINGS: Comminution of the tuft of the right thumb distal phalanx. Regional soft tissue injury with overlying dressing material in place. No radiopaque foreign body identified. The right thumb IP joint is intact and no other osseous injury is identified. IMPRESSION: Penetrating trauma with comminution of the tuft of the right thumb distal phalanx. No other osseous injury identified. Electronically Signed   By: Odessa FlemingH  Hall M.D.   On: 12/01/2018 14:39    Procedures .Marland Kitchen.Laceration Repair Date/Time: 12/01/2018 4:07 PM Performed by: Margarita Grizzleay, Francy Mcilvaine, MD Authorized by: Margarita Grizzleay, Mohid Furuya, MD   Anesthesia (see MAR for exact dosages):    Anesthesia method:  Nerve block   Block needle gauge:  27 G   Block anesthetic:  Lidocaine 1% WITH epi   Block technique:  Digital   Block injection procedure:  Introduced needle, anatomic landmarks palpated and negative aspiration for blood   Block outcome:  Anesthesia achieved Laceration details:    Location:  Finger   Finger location:  R thumb Repair type:    Repair type:  Simple Pre-procedure details:    Preparation:  Patient was prepped and draped in usual sterile fashion Exploration:    Wound exploration: wound explored through full range  of motion     Contaminated: no   Treatment:    Area cleansed with:  Betadine   Amount of cleaning:  Standard   Irrigation solution:  Sterile saline   Irrigation volume:  30   Irrigation method:  Syringe   Visualized foreign bodies/material removed: no   Skin repair:    Repair method:  Sutures   Wound skin closure material used: vicryl rapide.   Suture technique:  Simple interrupted   Number of sutures:  3 Comments:     3 sutures placed nail bed   (including critical care time)  Medications Ordered  in ED Medications  lidocaine-EPINEPHrine (XYLOCAINE W/EPI) 2 %-1:200000 (PF) injection 10 mL (has no administration in time range)  Tdap (BOOSTRIX) injection 0.5 mL (has no administration in time range)  povidone-iodine (BETADINE) 10 % external solution (has no administration in time range)     Initial Impression / Assessment and Plan / ED Course  I have reviewed the triage vital signs and the nursing notes.  Pertinent labs & imaging results that were available during my care of the patient were reviewed by me and considered in my medical decision making (see chart for details).     Asked with Dr. Janee Mornhompson Plan to continue Keflex Patient took Keflex here in ED without any problems Dressing to remain in place until followed up by Dr. Janee Mornhompson Dr. Carollee Massedhompson's office will call on Thursday Patient is given Dr. Carollee Massedhompson's office number in case of any miscommunication they understand that they should see him in follow-up and will call on Thursday if they are not contacted  Final Clinical Impressions(s) / ED Diagnoses   Final diagnoses:  Laceration of right thumb without foreign body with damage to nail, initial encounter  Open fracture of tuft of distal phalanx of finger    ED Discharge Orders    None       Margarita Grizzleay, Dalal Livengood, MD 12/01/18 1715    Margarita Grizzleay, Dimple Bastyr, MD 12/14/18 [phone]

## 2018-12-01 NOTE — ED Triage Notes (Signed)
Pt has cut the end of his thumb off with a table saw today. Bleeding controlled at triage. Pt is not sure when he received a last tetanus shot.

## 2018-12-01 NOTE — Discharge Instructions (Signed)
Dr. Carollee Massedhompson's office is to call you on Thursday If you do not hear from them please contact them Keep dressing intact Return for reevaluation if you have increased pain, redness, swelling, or streaking Take all of your antibiotics Use Tylenol as needed for pain

## 2018-12-04 ENCOUNTER — Other Ambulatory Visit: Payer: Self-pay | Admitting: Family Medicine

## 2018-12-11 ENCOUNTER — Encounter (HOSPITAL_COMMUNITY): Payer: Self-pay

## 2018-12-11 ENCOUNTER — Encounter (HOSPITAL_COMMUNITY)
Admission: RE | Admit: 2018-12-11 | Discharge: 2018-12-11 | Disposition: A | Discharge status: Home / Self Care | Payer: Medicare Other | Source: Ambulatory Visit | Attending: Internal Medicine | Admitting: Internal Medicine

## 2018-12-11 DIAGNOSIS — R1011 Right upper quadrant pain: Secondary | ICD-10-CM | POA: Diagnosis not present

## 2018-12-11 MED ORDER — TECHNETIUM TC 99M MEBROFENIN IV KIT
5.0000 | PACK | Freq: Once | INTRAVENOUS | Status: AC | PRN
  Administered 2018-12-11: 5 via INTRAVENOUS

## 2018-12-14 ENCOUNTER — Telehealth (INDEPENDENT_AMBULATORY_CARE_PROVIDER_SITE_OTHER): Payer: Self-pay | Admitting: Internal Medicine

## 2018-12-14 ENCOUNTER — Encounter (INDEPENDENT_AMBULATORY_CARE_PROVIDER_SITE_OTHER): Payer: Self-pay

## 2018-12-14 DIAGNOSIS — R948 Abnormal results of function studies of other organs and systems: Secondary | ICD-10-CM

## 2018-12-14 NOTE — Telephone Encounter (Signed)
Noted  

## 2018-12-14 NOTE — Telephone Encounter (Signed)
Clifford Gibson, Referral to Dr. Jenkins 

## 2018-12-14 NOTE — Telephone Encounter (Signed)
Patient returned your call regarding results °

## 2018-12-22 ENCOUNTER — Encounter: Payer: Self-pay | Admitting: General Surgery

## 2018-12-22 ENCOUNTER — Ambulatory Visit: Payer: Medicare Other | Admitting: General Surgery

## 2018-12-22 VITALS — BP 152/93 | HR 81 | Temp 97.8°F | Resp 20 | Wt 224.6 lb

## 2018-12-22 DIAGNOSIS — K811 Chronic cholecystitis: Secondary | ICD-10-CM

## 2018-12-22 NOTE — Patient Instructions (Signed)
Biliary Colic, Adult  Biliary colic is severe pain caused by a problem with a small organ in the upper right part of your belly (gallbladder). The gallbladder stores a digestive fluid produced in the liver (bile) that helps the body break down fat. Bile and other digestive enzymes are carried from the liver to the small intestine through tube-like structures (bile ducts). The gallbladder and the bile ducts form the biliary tract. Sometimes hard deposits of digestive fluids form in the gallbladder (gallstones) and block the flow of bile from the gallbladder, causing biliary colic. This condition is also called a gallbladder attack. Gallstones can be as small as a grain of sand or as big as a golf ball. There could be just one gallstone in the gallbladder, or there could be many. What are the causes? Biliary colic is usually caused by gallstones. Less often, a tumor could block the flow of bile from the gallbladder and trigger biliary colic. What increases the risk? This condition is more likely to develop in:  Women.  People of Hispanic descent.  People with a family history of gallstones.  People who are obese.  People who suddenly or quickly lose weight.  People who eat a high-calorie, low-fiber diet that is rich in refined carbs (carbohydrates), such as white bread and white rice.  People who have an intestinal disease that affects nutrient absorption, such as Crohn disease.  People who have a metabolic condition, such as metabolic syndrome or diabetes. What are the signs or symptoms? Severe pain in the upper right side of the belly is the main symptom of biliary colic. You may feel this pain below the chest but above the hip. This pain often occurs at night or after eating a very fatty meal. This pain may get worse for up to an hour and last as long as 12 hours. In most cases, the pain fades (subsides) within a couple hours. Other symptoms of this condition include:  Nausea and  vomiting.  Pain under the right shoulder. How is this diagnosed? This condition is diagnosed based on your medical history, your symptoms, and a physical exam. You may have tests, including:  Blood tests to rule out infection or inflammation of the bile ducts, gallbladder, pancreas, or liver.  Imaging studies such as: ? Ultrasound. ? CT scan. ? MRI. In some cases, you may need to have an imaging study done using a small amount of radioactive material (nuclear medicine) to confirm the diagnosis. How is this treated? Treatment for this condition may include medicine to relieve your pain or nausea. If you have gallstones that are causing biliary colic, you may need surgery to remove the gallbladder (cholecystectomy). Gallstones can also be dissolved gradually with medicine. It may take months or years before the gallstones are completely gone. Follow these instructions at home:  Take over-the-counter and prescription medicines only as told by your health care provider.  Drink enough fluid to keep your urine clear or pale yellow.  Follow instructions from your health care provider about eating or drinking restrictions. These may include avoiding: ? Fatty, greasy, and fried foods. ? Any foods that make the pain worse. ? Overeating. ? Having a large meal after not eating for a while.  Keep all follow-up visits as told by your health care provider. This is important. How is this prevented? Steps to prevent this condition include:  Maintaining a healthy body weight.  Getting regular exercise.  Eating a healthy, high-fiber, low-fat diet.  Limiting how much   sugar and refined carbs you eat, such as sweets, white flour, and white rice. Contact a health care provider if:  Your pain lasts more than 5 hours.  You vomit.  You have a fever and chills.  Your pain gets worse. Get help right away if:  Your skin or the whites of your eyes look yellow (jaundice).  Your have tea-colored  urine and light-colored stools.  You are dizzy or you faint. Summary  Biliary colic is severe pain caused by a problem with a small organ in the upper right part of your belly (gallbladder).  Treatments for this condition include medicines that relieves your pain or nausea and medicines that slowly dissolves the gallstones.  If gallstones cause your biliary colic, the treatment is surgery to remove the gallbladder (cholecystectomy). This information is not intended to replace advice given to you by your health care provider. Make sure you discuss any questions you have with your health care provider. Document Released: 04/21/2006 Document Revised: 05/26/2017 Document Reviewed: 06/03/2016 Elsevier Interactive Patient Education  2019 [employer]  

## 2018-12-22 NOTE — Progress Notes (Signed)
Clifford JewsLonnie R Gibson; [id number]; 27-Aug-1946   HPI Patient is a 73 year old white male who was referred to my care by Dr. Karilyn Cotaehman for evaluation treatment of biliary dyskinesia.  Patient has had intermittent vague right upper quadrant abdominal pain for the past few months.  It seemed to be worse over the holidays when he was eating heavy meals.  He states that since then, the intensity and frequency have decreased significantly.  He denies any nausea or bloating.  He denies any referral of the pain to the right flank or right shoulder.  He denies any fever, chills, jaundice.  He currently has 0 out of 10 abdominal pain.  Ultrasound gallbladder was negative for cholelithiasis.  He did have a HIDA scan which revealed a 31% ejection fraction and mild reproducible symptoms. Past Medical History:  Diagnosis Date  . Acute serous otitis media 02/22/2010  . Arthritis   . Diabetes mellitus without complication (HCC)   . ERECTILE DYSFUNCTION 04/25/2009  . GERD 03/24/2009  . HYPERLIPIDEMIA 03/28/2010  . HYPERTENSION 03/24/2009  . OBESITY, MODERATE 04/25/2009  . PONV (postoperative nausea and vomiting)     Past Surgical History:  Procedure Laterality Date  . COLONOSCOPY N/A 06/21/2015   Procedure: COLONOSCOPY;  Surgeon: Malissa HippoNajeeb U Rehman, MD;  Location: [facility];  Service: Endoscopy;  Laterality: N/A;  930 - moved to 7/20 @ 7:30  . NASAL SEPTUM SURGERY  1974  . TONSILLECTOMY     3 yrs. old  . TOTAL KNEE ARTHROPLASTY  06/11/2012   Procedure: TOTAL KNEE ARTHROPLASTY;  Surgeon: Javier DockerJeffrey C Beane, MD;  Location: [facility];  Service: Orthopedics;  Laterality: Right;    Family History  Problem Relation Age of Onset  . Scleroderma Mother   . Dementia Father     Current Outpatient Medications on File Prior to Visit  Medication Sig Dispense Refill  . ACCU-CHEK FASTCLIX LANCETS MISC Test once daily Dx E11.9 100 each 3  . aspirin EC 81 MG tablet Take 81 mg by mouth daily.    Marland Kitchen. atorvastatin (LIPITOR) 10 MG tablet  TAKE 1 TABLET DAILY 90 tablet 0  . Blood Glucose Monitoring Suppl (ACCU-CHEK AVIVA) device Use as instructed 1 each 0  . fluticasone (FLONASE) 50 MCG/ACT nasal spray 1 SPRAY NASALLY ONCE A DAY 48 g 1  . glucose blood (ACCU-CHEK AVIVA) test strip Test once daily. E11.9 100 each 12  . Magnesium 400 MG CAPS Take 1 tablet by mouth daily.    . metFORMIN (GLUCOPHAGE) 500 MG tablet TAKE 1 TABLET 2 TIMES A DAY WITH A MEAL 180 tablet 0  . sildenafil (VIAGRA) 100 MG tablet Take 100 mg by mouth daily as needed. For ED    . sucralfate (CARAFATE) 1 g tablet Take 1 tablet (1 g total) by mouth 4 (four) times daily -  with meals and at bedtime. 60 tablet 1  . valsartan-hydrochlorothiazide (DIOVAN-HCT) 160-12.5 MG tablet TAKE ONE TABLET AT BEDTIME 90 tablet 0   No current facility-administered medications on file prior to visit.     Allergies  Allergen Reactions  . Penicillins Other (See Comments)    Reaction=bleeding,diarrhea    Social History   Substance and Sexual Activity  Alcohol Use No    Social History   Tobacco Use  Smoking Status Never Smoker  Smokeless Tobacco Never Used    Review of Systems  Constitutional: Negative.   HENT: Negative.   Eyes: Negative.   Respiratory: Negative.   Cardiovascular: Negative.   Gastrointestinal: Negative.  Genitourinary: Negative.   Musculoskeletal: Positive for joint pain.  Skin: Negative.   Neurological: Negative.   Endo/Heme/Allergies: Negative.   Psychiatric/Behavioral: Negative.     Objective   Vitals:   12/22/18 0911  BP: (!) 152/93  Pulse: 81  Resp: 20  Temp: 97.8 F (36.6 C)    Physical Exam Vitals signs reviewed.  Constitutional:      Appearance: He is well-developed. He is not ill-appearing.  HENT:     Head: Normocephalic and atraumatic.  Eyes:     General: No scleral icterus. Cardiovascular:     Rate and Rhythm: Normal rate and regular rhythm.     Heart sounds: Normal heart sounds. No murmur. No gallop.    Pulmonary:     Effort: Pulmonary effort is normal. No respiratory distress.     Breath sounds: Normal breath sounds. No stridor. No wheezing, rhonchi or rales.  Abdominal:     General: Abdomen is flat and protuberant. Bowel sounds are normal.     Palpations: Abdomen is soft.     Tenderness: There is no abdominal tenderness. There is no guarding or rebound. Negative signs include Murphy's sign.  Skin:    General: Skin is warm and dry.  Neurological:     Mental Status: He is alert and oriented to person, place, and time.   Dr. Patty Sermons notes reviewed.  Assessment  Chronic cholecystitis, currently asymptomatic Plan   I told the patient that since his symptoms have decreased significantly, cholecystectomy is not warranted at this time.  Signs and symptoms of biliary colic were explained to the patient.  Should he worsen, I told him to call the office and would then proceed with cholecystectomy.  He understands and agrees.  Follow-up here as needed.

## 2018-12-24 ENCOUNTER — Other Ambulatory Visit: Payer: Self-pay | Admitting: Family Medicine

## 2019-01-05 NOTE — Addendum Note (Signed)
Encounter addended by: Robie Ridge A on: 01/05/2019 4:01 PM  Actions taken: Imaging Exam ended, Charge Capture section accepted

## 2019-01-07 ENCOUNTER — Ambulatory Visit: Payer: Medicare Other | Admitting: Internal Medicine

## 2019-01-08 ENCOUNTER — Encounter: Payer: Self-pay | Admitting: Family Medicine

## 2019-01-08 ENCOUNTER — Ambulatory Visit: Payer: Medicare Other | Admitting: Family Medicine

## 2019-01-08 VITALS — BP 122/62 | HR 90 | Temp 98.2°F | Wt 219.5 lb

## 2019-01-08 DIAGNOSIS — J101 Influenza due to other identified influenza virus with other respiratory manifestations: Secondary | ICD-10-CM

## 2019-01-08 DIAGNOSIS — R6889 Other general symptoms and signs: Secondary | ICD-10-CM

## 2019-01-08 MED ORDER — OSELTAMIVIR PHOSPHATE 75 MG PO CAPS: 75.0000 mg | ORAL_CAPSULE | Freq: Two times a day (BID) | ORAL | 0 refills | Status: DC

## 2019-01-08 NOTE — Progress Notes (Signed)
Clifford JewsLonnie R Gibson DOB: 26-Mar-1946 Encounter date: 01/08/2019  This is a 73 y.o. male who presents with Chief Complaint  Patient presents with  . Cough    x 2 days, chest and nasal congestion, productive cough, body aches, chills, fever last night    History of present illness:  Aches and fever last night. Congestion in nose. Phlegm with coughing, not SOB/Wheezing.   Lucila MaineGrandson and wife have been sick. Wife here as well with similar sx.     Allergies  Allergen Reactions  . Penicillins Other (See Comments)    Reaction=bleeding,diarrhea   Current Meds  Medication Sig  . ACCU-CHEK FASTCLIX LANCETS MISC Test once daily Dx E11.9  . aspirin EC 81 MG tablet Take 81 mg by mouth daily.  Marland Kitchen. atorvastatin (LIPITOR) 10 MG tablet TAKE 1 TABLET DAILY  . Blood Glucose Monitoring Suppl (ACCU-CHEK AVIVA) device Use as instructed  . fluticasone (FLONASE) 50 MCG/ACT nasal spray 1 SPRAY NASALLY ONCE A DAY  . glucose blood (ACCU-CHEK AVIVA) test strip Test once daily. E11.9  . Magnesium 400 MG CAPS Take 1 tablet by mouth daily.  . metFORMIN (GLUCOPHAGE) 500 MG tablet TAKE 1 TABLET 2 TIMES A DAY WITH A MEAL  . sildenafil (VIAGRA) 100 MG tablet Take 100 mg by mouth daily as needed. For ED  . sucralfate (CARAFATE) 1 g tablet Take 1 tablet (1 g total) by mouth 4 (four) times daily - with mealsand at bedtime.  . valsartan-hydrochlorothiazide (DIOVAN-HCT) 160-12.5 MG tablet TAKE ONE TABLET AT BEDTIME    Review of Systems  Constitutional: Positive for activity change (decreased), appetite change (decreased), chills, fatigue and fever.  HENT: Positive for congestion and rhinorrhea.   Respiratory: Positive for cough. Negative for shortness of breath and wheezing.     Objective:  BP 122/62 (BP Location: Left Arm, Patient Position: Sitting, Cuff Size: Normal)   Pulse 90   Temp 98.2 F (36.8 C) (Oral)   Wt 219 lb 8 oz (99.6 kg)   SpO2 93%   BMI 33.37 kg/m   Weight: 219 lb 8 oz (99.6 kg)   BP Readings  from Last 3 Encounters:  01/08/19 122/62  12/22/18 (!) 152/93  12/01/18 (!) 143/84   Wt Readings from Last 3 Encounters:  01/08/19 219 lb 8 oz (99.6 kg)  12/22/18 224 lb 9.6 oz (101.9 kg)  11/30/18 221 lb 1.6 oz (100.3 kg)    Physical Exam Constitutional:      General: He is not in acute distress.    Appearance: He is well-developed. He is obese. He is ill-appearing.  HENT:     Left Ear: Tympanic membrane and ear canal normal.     Mouth/Throat:     Mouth: Mucous membranes are moist.     Pharynx: Posterior oropharyngeal erythema present. No oropharyngeal exudate.  Cardiovascular:     Rate and Rhythm: Normal rate and regular rhythm.     Heart sounds: Normal heart sounds. No murmur. No friction rub.  Pulmonary:     Effort: Pulmonary effort is normal. No respiratory distress.     Breath sounds: Normal breath sounds. No wheezing or rales.  Musculoskeletal:     Right lower leg: No edema.     Left lower leg: No edema.  Neurological:     Mental Status: He is alert and oriented to person, place, and time.  Psychiatric:        Behavior: Behavior normal.     Assessment/Plan  1. Influenza A tamiflu as directed. Precautions around  others for prevention reviewed.  2. Flu-like symptoms - POC Influenza A&B(BINAX/QUICKVUE)  Does have significant cerumen right ear. Lavage was ordered but not completed before patient left. Order removed.     Return if symptoms worsen or fail to improve.     Theodis ShoveJunell Bardia Wangerin, MD

## 2019-01-08 NOTE — Patient Instructions (Signed)

## 2019-01-09 LAB — POC INFLUENZA A&B (BINAX/QUICKVUE)
Influenza A, POC: POSITIVE — AB
Influenza B, POC: NEGATIVE

## 2019-02-15 ENCOUNTER — Other Ambulatory Visit: Payer: Self-pay | Admitting: Family Medicine

## 2019-02-18 ENCOUNTER — Other Ambulatory Visit: Payer: Self-pay | Admitting: Family Medicine

## 2019-02-18 DIAGNOSIS — I10 Essential (primary) hypertension: Secondary | ICD-10-CM

## 2019-03-14 NOTE — Progress Notes (Signed)
Hi Clifford Gibson, You may want to call patient and see how he is doing since he did not undergo gallbladder surgery.

## 2019-03-17 ENCOUNTER — Ambulatory Visit: Payer: Medicare Other

## 2019-04-08 ENCOUNTER — Telehealth: Payer: Self-pay | Admitting: *Deleted

## 2019-04-08 NOTE — Telephone Encounter (Signed)
Called to r/s awv message was left

## 2019-04-28 ENCOUNTER — Ambulatory Visit: Payer: Medicare Other

## 2019-04-29 LAB — HM DIABETES EYE EXAM

## 2019-05-19 ENCOUNTER — Ambulatory Visit: Payer: Medicare Other | Admitting: Family Medicine

## 2019-05-19 ENCOUNTER — Other Ambulatory Visit: Payer: Self-pay | Admitting: Family Medicine

## 2019-05-19 NOTE — Telephone Encounter (Signed)
Patient has an appointment in office tomorrow for Ear cleaning and needs labs. I did not send in medication yet since it has been a year since labs.

## 2019-05-20 ENCOUNTER — Other Ambulatory Visit: Payer: Self-pay

## 2019-05-20 ENCOUNTER — Other Ambulatory Visit: Payer: Medicare Other

## 2019-05-20 ENCOUNTER — Encounter: Payer: Self-pay | Admitting: Family Medicine

## 2019-05-20 ENCOUNTER — Ambulatory Visit (INDEPENDENT_AMBULATORY_CARE_PROVIDER_SITE_OTHER): Payer: Medicare Other | Admitting: Family Medicine

## 2019-05-20 VITALS — BP 130/70 | HR 79 | Temp 98.0°F | Wt 220.7 lb

## 2019-05-20 DIAGNOSIS — I10 Essential (primary) hypertension: Secondary | ICD-10-CM

## 2019-05-20 DIAGNOSIS — E119 Type 2 diabetes mellitus without complications: Secondary | ICD-10-CM

## 2019-05-20 DIAGNOSIS — E785 Hyperlipidemia, unspecified: Secondary | ICD-10-CM

## 2019-05-20 DIAGNOSIS — H6121 Impacted cerumen, right ear: Secondary | ICD-10-CM

## 2019-05-20 LAB — HEMOGLOBIN A1C: Hgb A1c MFr Bld: 7.4 % — ABNORMAL HIGH (ref 4.6–6.5)

## 2019-05-20 MED ORDER — METFORMIN HCL 500 MG PO TABS: 500.0000 mg | ORAL_TABLET | Freq: Two times a day (BID) | ORAL | 3 refills | Status: DC

## 2019-05-20 MED ORDER — VALSARTAN-HYDROCHLOROTHIAZIDE 160-12.5 MG PO TABS: 1.0000 | ORAL_TABLET | Freq: Every day | ORAL | 3 refills | Status: DC

## 2019-05-20 MED ORDER — ROSUVASTATIN CALCIUM 5 MG PO TABS: 5.0000 mg | ORAL_TABLET | Freq: Every day | ORAL | 3 refills | Status: DC

## 2019-05-20 NOTE — Progress Notes (Signed)
Subjective:     Patient ID: Clifford Gibson, male   DOB: 06-13-1946, 73 y.o.   MRN: [id number]  HPI Patient seen for medical follow-up.  His chronic problems include history of obesity, hypertension, hyperlipidemia, GERD, type 2 diabetes.  He states his fasting blood sugars recently been stable in the low 100s.  Remains on metformin 500 mg twice daily.  He was on Lipitor 10 mg daily for hyperlipidemia but had some joint pains.  Took himself off about 3 weeks ago and joint pains have ceased.  He would like to explore other options.  He takes Diovan HCTZ for hypertension.  Blood pressures been stable.  He is overdue for lab work.  He has right ear fullness and has had cerumen impactions in the past.  Symptoms are worse at night.  No ear pain.  Past Medical History:  Diagnosis Date  . Acute serous otitis media 02/22/2010  . Arthritis   . Diabetes mellitus without complication ([place])   . ERECTILE DYSFUNCTION 04/25/2009  . GERD 03/24/2009  . HYPERLIPIDEMIA 03/28/2010  . HYPERTENSION 03/24/2009  . OBESITY, MODERATE 04/25/2009  . PONV (postoperative nausea and vomiting)    Past Surgical History:  Procedure Laterality Date  . COLONOSCOPY N/A 06/21/2015   Procedure: COLONOSCOPY;  Surgeon: Rogene Houston, MD;  [facility]: [facility];  Service: Endoscopy;  Laterality: N/A;  930 - moved to 7/20 @ 7:30  . NASAL SEPTUM SURGERY  1974  . TONSILLECTOMY     3 yrs. old  . TOTAL KNEE ARTHROPLASTY  06/11/2012   Procedure: TOTAL KNEE ARTHROPLASTY;  Surgeon: Johnn Hai, MD;  Location: [facility];  Service: Orthopedics;  Laterality: Right;    reports that he has never smoked. He has never used smokeless tobacco. He reports that he does not drink alcohol or use drugs. family history includes Dementia in his father; Scleroderma in his mother. Allergies  Allergen Reactions  . Lipitor [Atorvastatin Calcium] Other (See Comments)  . Penicillins Other (See Comments)    Reaction=bleeding,diarrhea     Review  of Systems  Constitutional: Negative for fatigue.  Eyes: Negative for visual disturbance.  Respiratory: Negative for cough, chest tightness and shortness of breath.   Cardiovascular: Negative for chest pain, palpitations and leg swelling.  Endocrine: Negative for polydipsia and polyuria.  Neurological: Negative for dizziness, syncope, weakness, light-headedness and headaches.       Objective:   Physical Exam Constitutional:      Appearance: He is well-developed.  HENT:     Ears:     Comments: Minimal cerumen left canal.  He has impaction right canal Eyes:     Pupils: Pupils are equal, round, and reactive to light.  Neck:     Musculoskeletal: Neck supple.     Thyroid: No thyromegaly.  Cardiovascular:     Rate and Rhythm: Normal rate and regular rhythm.  Pulmonary:     Effort: Pulmonary effort is normal. No respiratory distress.     Breath sounds: Normal breath sounds. No wheezing or rales.  Skin:    Comments: Feet reveal no skin lesions. Good distal foot pulses. Good capillary refill. No calluses. Normal sensation with monofilament testing   Neurological:     Mental Status: He is alert and oriented to person, place, and time.        Assessment:     #1 type 2 diabetes.  History of good control previously  #2 hypertension stable  #3 dyslipidemia.  Possible intolerance with Lipitor with arthralgias  #  4 cerumen impaction right ear canal    Plan:     -Check further labs with A1c, lipid, hepatic, basic metabolic panel -Refilled valsartan HCTZ and metformin for 1 year -Irrigation of right ear-cleared without difficulty.  Patient tolerated well. -Discontinue Lipitor and start Crestor 5 mg once daily.  If tolerating this we may consider further titration later -We will plan routine follow-up in 6 months and sooner as needed  Kristian CoveyBruce W Renisha Cockrum MD [facility]

## 2019-05-21 LAB — HEPATIC FUNCTION PANEL
ALT: 16 U/L (ref 0–53)
AST: 13 U/L (ref 0–37)
Albumin: 4.7 g/dL (ref 3.5–5.2)
Alkaline Phosphatase: 45 U/L (ref 39–117)
Bilirubin, Direct: 0.1 mg/dL (ref 0.0–0.3)
Total Bilirubin: 0.5 mg/dL (ref 0.2–1.2)
Total Protein: 6.5 g/dL (ref 6.0–8.3)

## 2019-05-21 LAB — BASIC METABOLIC PANEL
BUN: 23 mg/dL (ref 6–23)
CO2: 29 mEq/L (ref 19–32)
Calcium: 10.4 mg/dL (ref 8.4–10.5)
Chloride: 101 mEq/L (ref 96–112)
Creatinine, Ser: 1.17 mg/dL (ref 0.40–1.50)
GFR: 60.87 mL/min (ref >60.00)
Glucose, Bld: 130 mg/dL — ABNORMAL HIGH (ref 70–99)
Potassium: 4.6 mEq/L (ref 3.5–5.1)
Sodium: 141 mEq/L (ref 135–145)

## 2019-05-21 LAB — LIPID PANEL
Cholesterol: 233 mg/dL — ABNORMAL HIGH (ref 0–200)
HDL: 47.5 mg/dL (ref >39.00)
LDL Cholesterol: 147 mg/dL — ABNORMAL HIGH (ref 0–99)
NonHDL: 185.17
Total CHOL/HDL Ratio: 5
Triglycerides: 192 mg/dL — ABNORMAL HIGH (ref 0.0–149.0)
VLDL: 38.4 mg/dL (ref 0.0–40.0)

## 2019-05-26 ENCOUNTER — Other Ambulatory Visit: Payer: Self-pay

## 2019-05-26 MED ORDER — METFORMIN HCL 500 MG PO TABS: 1000.0000 mg | ORAL_TABLET | Freq: Two times a day (BID) | ORAL | 3 refills | Status: DC

## 2019-06-23 ENCOUNTER — Ambulatory Visit: Payer: Medicare Other

## 2019-07-02 ENCOUNTER — Encounter: Payer: Self-pay | Admitting: Family Medicine

## 2019-07-02 NOTE — Progress Notes (Signed)
[facility]/thx dmf 

## 2019-07-23 ENCOUNTER — Other Ambulatory Visit: Payer: Self-pay

## 2019-07-23 ENCOUNTER — Encounter: Payer: Self-pay | Admitting: Family Medicine

## 2019-07-23 ENCOUNTER — Ambulatory Visit (INDEPENDENT_AMBULATORY_CARE_PROVIDER_SITE_OTHER): Payer: Medicare Other | Admitting: Family Medicine

## 2019-07-23 VITALS — BP 112/78 | HR 76 | Temp 98.0°F | Ht 66.5 in | Wt 218.6 lb

## 2019-07-23 DIAGNOSIS — E785 Hyperlipidemia, unspecified: Secondary | ICD-10-CM | POA: Diagnosis not present

## 2019-07-23 DIAGNOSIS — I10 Essential (primary) hypertension: Secondary | ICD-10-CM | POA: Diagnosis not present

## 2019-07-23 DIAGNOSIS — Z0000 Encounter for general adult medical examination without abnormal findings: Secondary | ICD-10-CM

## 2019-07-23 DIAGNOSIS — E119 Type 2 diabetes mellitus without complications: Secondary | ICD-10-CM

## 2019-07-23 LAB — HEPATIC FUNCTION PANEL
ALT: 14 U/L (ref 0–53)
AST: 13 U/L (ref 0–37)
Albumin: 5 g/dL (ref 3.5–5.2)
Alkaline Phosphatase: 40 U/L (ref 39–117)
Bilirubin, Direct: 0.1 mg/dL (ref 0.0–0.3)
Total Bilirubin: 0.5 mg/dL (ref 0.2–1.2)
Total Protein: 6.8 g/dL (ref 6.0–8.3)

## 2019-07-23 LAB — BASIC METABOLIC PANEL
BUN: 33 mg/dL — ABNORMAL HIGH (ref 6–23)
CO2: 31 mEq/L (ref 19–32)
Calcium: 9.9 mg/dL (ref 8.4–10.5)
Chloride: 101 mEq/L (ref 96–112)
Creatinine, Ser: 1.38 mg/dL (ref 0.40–1.50)
GFR: 50.29 mL/min — ABNORMAL LOW (ref >60.00)
Glucose, Bld: 124 mg/dL — ABNORMAL HIGH (ref 70–99)
Potassium: 4.5 mEq/L (ref 3.5–5.1)
Sodium: 140 mEq/L (ref 135–145)

## 2019-07-23 LAB — LIPID PANEL
Cholesterol: 160 mg/dL (ref 0–200)
HDL: 41 mg/dL (ref >39.00)
LDL Cholesterol: 82 mg/dL (ref 0–99)
NonHDL: 118.81
Total CHOL/HDL Ratio: 4
Triglycerides: 184 mg/dL — ABNORMAL HIGH (ref 0.0–149.0)
VLDL: 36.8 mg/dL (ref 0.0–40.0)

## 2019-07-23 LAB — HEMOGLOBIN A1C: Hgb A1c MFr Bld: 7.1 % — ABNORMAL HIGH (ref 4.6–6.5)

## 2019-07-23 NOTE — Progress Notes (Signed)
Subjective:     Patient ID: Clifford Gibson, male   DOB: [birth year], 73 y.o.   MRN: [id number]  HPI Patient is seen for physical exam.  His chronic problems include history of obesity, hypertension, type 2 diabetes, dyslipidemia.  His cholesterol was elevated several months ago we started low-dose Crestor.  He does have some arthralgias but these preceded his statin.  No myalgias.  Recent A1c 7.4%.  We increased his metformin to two 500 mg tablets twice daily.  Tolerating well with no GI side effects.  He has done some walking for exercise  No recent chest pains.  Health maintenance reviewed.  Hepatitis C screen previously negative.  Tetanus up-to-date.  Colonoscopy due 2027.  Eye exam up-to-date.  Pneumonia vaccines up-to-date.  Plans to get flu vaccine later.  Has had prior Shingrix.    Past Medical History:  Diagnosis Date  . Acute serous otitis media 02/22/2010  . Arthritis   . Diabetes mellitus without complication ([place])   . ERECTILE DYSFUNCTION 04/25/2009  . GERD 03/24/2009  . HYPERLIPIDEMIA 03/28/2010  . HYPERTENSION 03/24/2009  . OBESITY, MODERATE 04/25/2009  . PONV (postoperative nausea and vomiting)    Past Surgical History:  Procedure Laterality Date  . COLONOSCOPY N/A 06/21/2015   Procedure: COLONOSCOPY;  Surgeon: Rogene Houston, MD;  [facility];  Service: Endoscopy;  Laterality: N/A;  930 - moved to 7/20 @ 7:30  . NASAL SEPTUM SURGERY  1974  . TONSILLECTOMY     3 yrs. old  . TOTAL KNEE ARTHROPLASTY  06/11/2012   Procedure: TOTAL KNEE ARTHROPLASTY;  Surgeon: Johnn Hai, MD;  Location: [facility];  Service: Orthopedics;  Laterality: Right;    reports that he has never smoked. He has never used smokeless tobacco. He reports that he does not drink alcohol or use drugs. family history includes Dementia in his father; Scleroderma in his mother. Allergies  Allergen Reactions  . Lipitor [Atorvastatin Calcium] Other (See Comments)  . Penicillins Other (See  Comments)    Reaction=bleeding,diarrhea     Review of Systems  Constitutional: Negative for activity change, appetite change, fatigue and fever.  HENT: Negative for congestion, ear pain and trouble swallowing.   Eyes: Negative for pain and visual disturbance.  Respiratory: Negative for cough, shortness of breath and wheezing.   Cardiovascular: Negative for chest pain and palpitations.  Gastrointestinal: Negative for abdominal distention, abdominal pain, blood in stool, constipation, diarrhea, nausea, rectal pain and vomiting.  Endocrine: Negative for polydipsia and polyuria.  Genitourinary: Negative for dysuria, hematuria and testicular pain.  Musculoskeletal: Positive for arthralgias. Negative for joint swelling.  Skin: Negative for rash.  Neurological: Negative for dizziness, syncope, weakness and headaches.  Hematological: Negative for adenopathy.  Psychiatric/Behavioral: Negative for confusion and dysphoric mood.       Objective:   Physical Exam Constitutional:      General: He is not in acute distress.    Appearance: He is well-developed.  HENT:     Head: Normocephalic and atraumatic.     Right Ear: External ear normal.     Left Ear: External ear normal.  Eyes:     Conjunctiva/sclera: Conjunctivae normal.     Pupils: Pupils are equal, round, and reactive to light.  Neck:     Musculoskeletal: Normal range of motion and neck supple.     Thyroid: No thyromegaly.  Cardiovascular:     Rate and Rhythm: Normal rate and regular rhythm.     Heart sounds: Normal heart sounds.  No murmur.  Pulmonary:     Effort: Pulmonary effort is normal. No respiratory distress.     Breath sounds: Normal breath sounds. No wheezing or rales.  Abdominal:     General: Bowel sounds are normal. There is no distension.     Palpations: Abdomen is soft. There is no mass.     Tenderness: There is no abdominal tenderness. There is no guarding or rebound.  Musculoskeletal:     Right lower leg: No  edema.     Left lower leg: No edema.  Lymphadenopathy:     Cervical: No cervical adenopathy.  Skin:    Findings: No rash.  Neurological:     Mental Status: He is alert and oriented to person, place, and time.     Cranial Nerves: No cranial nerve deficit.     Deep Tendon Reflexes: Reflexes normal.        Assessment:     Physical exam.  Patient has chronic medical problems as above.  We recently addressed the fact that his lipids were not well controlled and A1c not to goal.  Hopefully will see improvements with the Crestor and increased dose of metformin    Plan:     -Recheck labs today with lipids, hepatic, basic metabolic panel, A1c -Consider addition of GLP-1 versus SGLT2 medication if A1c not closer to goal -Continue with annual flu vaccine -He is encouraged to try to lose some weight -We will plan routine follow-up in 6 months and sooner as needed  Kristian CoveyBruce W Keonda Dow MD [facility]

## 2019-09-09 ENCOUNTER — Ambulatory Visit: Payer: Medicare Other

## 2019-11-30 ENCOUNTER — Ambulatory Visit (INDEPENDENT_AMBULATORY_CARE_PROVIDER_SITE_OTHER): Payer: Medicare Other

## 2019-11-30 VITALS — Ht 68.0 in | Wt 205.0 lb

## 2019-11-30 DIAGNOSIS — Z0000 Encounter for general adult medical examination without abnormal findings: Secondary | ICD-10-CM

## 2019-11-30 NOTE — Patient Instructions (Signed)
Clifford Gibson , Thank you for taking time to participate in your Medicare Wellness Visit. I appreciate your ongoing commitment to your health goals. Please review the following plan we discussed and let me know if I can assist you in the future.   Screening recommendations/referrals: Colorectal Screening: colonoscopy was done 07/08/2016; due 07/08/2026.   Vision and Dental Exams: Recommended annual ophthalmology exams for early detection of glaucoma and other disorders of the eye Recommended annual dental exams for proper oral hygiene. Patient completes these as instructed.  Diabetic Exams: Diabetic Eye Exam: 04/29/2019; due again May 2021. Diabetic Foot Exam: 07/23/2019; due again 07/22/2020.  Vaccinations: Influenza vaccine: completed 08/26/2019; due again Fall 2021. Pneumococcal vaccine: completed 09/14/2018; up to date. Tdap vaccine: 12/01/2018; due again 12/01/2028. Shingles vaccine: completed 09/14/2018 and 11/17/2018.   Advanced directives: Advance directives discussed with you today. Please bring a copy of your POA (Power of [place]) and/or Living Will to your next appointment.  Goals: Recommend to drink at least 6-8 8oz glasses of water per day. Recommend to exercise for at least 150 minutes per week.  Next appointment: Please schedule your Annual Wellness Visit with your Nurse Health Advisor in one year.  Preventive Care 4 Years and Older, Male Preventive care refers to lifestyle choices and visits with your health care provider that can promote health and wellness. What does preventive care include?  A yearly physical exam. This is also called an annual well check.  Dental exams once or twice a year.  Routine eye exams. Ask your health care provider how often you should have your eyes checked.  Personal lifestyle choices, including:  Daily care of your teeth and gums.  Regular physical activity.  Eating a healthy diet.  Avoiding tobacco and drug  use.  Limiting alcohol use.  Practicing safe sex.  Taking low doses of aspirin every day if recommended by your health care provider..  Taking vitamin and mineral supplements as recommended by your health care provider. What happens during an annual well check? The services and screenings done by your health care provider during your annual well check will depend on your age, overall health, lifestyle risk factors, and family history of disease. Counseling  Your health care provider may ask you questions about your:  Alcohol use.  Tobacco use.  Drug use.  Emotional well-being.  Home and relationship well-being.  Sexual activity.  Eating habits.  History of falls.  Memory and ability to understand (cognition).  Work and work Statistician. Screening  You may have the following tests or measurements:  Height, weight, and BMI.  Blood pressure.  Lipid and cholesterol levels. These may be checked every 5 years, or more frequently if you are over 53 years old.  Skin check.  Lung cancer screening. You may have this screening every year starting at age 76 if you have a 30-pack-year history of smoking and currently smoke or have quit within the past 15 years.  Fecal occult blood test (FOBT) of the stool. You may have this test every year starting at age 103.  Flexible sigmoidoscopy or colonoscopy. You may have a sigmoidoscopy every 5 years or a colonoscopy every 10 years starting at age 66.  Prostate cancer screening. Recommendations will vary depending on your family history and other risks.  Hepatitis C blood test.  Hepatitis B blood test.  Sexually transmitted disease (STD) testing.  Diabetes screening. This is done by checking your blood sugar (glucose) after you have not eaten for a while (fasting). You  may have this done every 1-3 years.  Abdominal aortic aneurysm (AAA) screening. You may need this if you are a current or former smoker.  Osteoporosis. You may  be screened starting at age 33 if you are at high risk. Talk with your health care provider about your test results, treatment options, and if necessary, the need for more tests. Vaccines  Your health care provider may recommend certain vaccines, such as:  Influenza vaccine. This is recommended every year.  Tetanus, diphtheria, and acellular pertussis (Tdap, Td) vaccine. You may need a Td booster every 10 years.  Zoster vaccine. You may need this after age 10.  Pneumococcal 13-valent conjugate (PCV13) vaccine. One dose is recommended after age 38.  Pneumococcal polysaccharide (PPSV23) vaccine. One dose is recommended after age 100. Talk to your health care provider about which screenings and vaccines you need and how often you need them. This information is not intended to replace advice given to you by your health care provider. Make sure you discuss any questions you have with your health care provider. Document Released: 12/15/2015 Document Revised: 08/07/2016 Document Reviewed: 09/19/2015 Elsevier Interactive Patient Education  2017 [employer].  Fall Prevention in the Home Falls can cause injuries. They can happen to people of all ages. There are many things you can do to make your home safe and to help prevent falls. What can I do on the outside of my home?  Regularly fix the edges of walkways and driveways and fix any cracks.  Remove anything that might make you trip as you walk through a door, such as a raised step or threshold.  Trim any bushes or trees on the path to your home.  Use bright outdoor lighting.  Clear any walking paths of anything that might make someone trip, such as rocks or tools.  Regularly check to see if handrails are loose or broken. Make sure that both sides of any steps have handrails.  Any raised decks and porches should have guardrails on the edges.  Have any leaves, snow, or ice cleared regularly.  Use sand or salt on walking paths during  winter.  Clean up any spills in your garage right away. This includes oil or grease spills. What can I do in the bathroom?  Use night lights.  Install grab bars by the toilet and in the tub and shower. Do not use towel bars as grab bars.  Use non-skid mats or decals in the tub or shower.  If you need to sit down in the shower, use a plastic, non-slip stool.  Keep the floor dry. Clean up any water that spills on the floor as soon as it happens.  Remove soap buildup in the tub or shower regularly.  Attach bath mats securely with double-sided non-slip rug tape.  Do not have throw rugs and other things on the floor that can make you trip. What can I do in the bedroom?  Use night lights.  Make sure that you have a light by your bed that is easy to reach.  Do not use any sheets or blankets that are too big for your bed. They should not hang down onto the floor.  Have a firm chair that has side arms. You can use this for support while you get dressed.  Do not have throw rugs and other things on the floor that can make you trip. What can I do in the kitchen?  Clean up any spills right away.  Avoid walking on  wet floors.  Keep items that you use a lot in easy-to-reach places.  If you need to reach something above you, use a strong step stool that has a grab bar.  Keep electrical cords out of the way.  Do not use floor polish or wax that makes floors slippery. If you must use wax, use non-skid floor wax.  Do not have throw rugs and other things on the floor that can make you trip. What can I do with my stairs?  Do not leave any items on the stairs.  Make sure that there are handrails on both sides of the stairs and use them. Fix handrails that are broken or loose. Make sure that handrails are as long as the stairways.  Check any carpeting to make sure that it is firmly attached to the stairs. Fix any carpet that is loose or worn.  Avoid having throw rugs at the top or  bottom of the stairs. If you do have throw rugs, attach them to the floor with carpet tape.  Make sure that you have a light switch at the top of the stairs and the bottom of the stairs. If you do not have them, ask someone to add them for you. What else can I do to help prevent falls?  Wear shoes that:  Do not have high heels.  Have rubber bottoms.  Are comfortable and fit you well.  Are closed at the toe. Do not wear sandals.  If you use a stepladder:  Make sure that it is fully opened. Do not climb a closed stepladder.  Make sure that both sides of the stepladder are locked into place.  Ask someone to hold it for you, if possible.  Clearly mark and make sure that you can see:  Any grab bars or handrails.  First and last steps.  Where the edge of each step is.  Use tools that help you move around (mobility aids) if they are needed. These include:  Canes.  Walkers.  Scooters.  Crutches.  Turn on the lights when you go into a dark area. Replace any light bulbs as soon as they burn out.  Set up your furniture so you have a clear path. Avoid moving your furniture around.  If any of your floors are uneven, fix them.  If there are any pets around you, be aware of where they are.  Review your medicines with your doctor. Some medicines can make you feel dizzy. This can increase your chance of falling. Ask your doctor what other things that you can do to help prevent falls. This information is not intended to replace advice given to you by your health care provider. Make sure you discuss any questions you have with your health care provider. Document Released: 09/14/2009 Document Revised: 04/25/2016 Document Reviewed: 12/23/2014 Elsevier Interactive Patient Education  2017 [employer].

## 2019-11-30 NOTE — Progress Notes (Signed)
This visit is being conducted via phone call due to the COVID-19 pandemic. This patient has given me verbal consent via phone to conduct this visit, patient states they are participating from their home address. Some vital signs may be absent or patient reported.   Patient identification: identified by name, DOB, and current address.  Location provider: [facility], Office Persons participating in the virtual visit: Clifford Gibson and Franne Forts, LPN.    Subjective:   Clifford Gibson is a 73 y.o. male who presents for Medicare Annual/Subsequent preventive examination.  Review of Systems:   Cardiac Risk Factors include: advanced age (>21men, >74 women);diabetes mellitus;obesity (BMI >30kg/m2);hypertension;male gender    Objective:    Vitals: Ht 5\' 8"  (1.727 m)   Wt 205 lb (93 kg)   BMI 31.17 kg/m   Body mass index is 31.17 kg/m.  Advanced Directives 11/30/2019 12/01/2018 03/10/2018 09/24/2016 11/02/2015 06/21/2015 06/11/2012  Does Patient Have a Medical Advance Directive? Yes Yes Yes Yes Yes Yes Patient does not have advance directive  Type of Advance Directive [place];Living will Living will - - - Living will -  Does patient want to make changes to medical advance directive? No - Patient declined - - - - - -  Copy of [place] in Chart? No - copy requested - - - - No - copy requested -  Would patient like information on creating a medical advance directive? - - - - - No - patient declined information -  Pre-existing out of facility DNR order (yellow form or pink MOST form) - - - - - - -    Tobacco Social History   Tobacco Use  Smoking Status Never Smoker  Smokeless Tobacco Never Used     Counseling given: Not Answered   Clinical Intake:  Pre-visit preparation completed: Yes  Pain : No/denies pain     BMI - recorded: 31.17 Nutritional Status: BMI > 30  Obese Nutritional Risks: None Diabetes: Yes CBG done?: No Did  pt. bring in CBG monitor from home?: No(telephone visit)  How often do you need to have someone help you when you read instructions, pamphlets, or other written materials from your doctor or pharmacy?: 1 - Never  Interpreter Needed?: No  Information entered by :: Franne Forts, LPN.  Past Medical History:  Diagnosis Date  . Acute serous otitis media 02/22/2010  . Arthritis   . Diabetes mellitus without complication ([place])   . ERECTILE DYSFUNCTION 04/25/2009  . GERD 03/24/2009  . HYPERLIPIDEMIA 03/28/2010  . HYPERTENSION 03/24/2009  . OBESITY, MODERATE 04/25/2009  . PONV (postoperative nausea and vomiting)    Past Surgical History:  Procedure Laterality Date  . COLONOSCOPY N/A 06/21/2015   Procedure: COLONOSCOPY;  Surgeon: Rogene Houston, MD;  [facility];  Service: Endoscopy;  Laterality: N/A;  930 - moved to 7/20 @ 7:30  . NASAL SEPTUM SURGERY  1974  . TONSILLECTOMY     3 yrs. old  . TOTAL KNEE ARTHROPLASTY  06/11/2012   Procedure: TOTAL KNEE ARTHROPLASTY;  Surgeon: Johnn Hai, MD;  Location: [facility];  Service: Orthopedics;  Laterality: Right;   Family History  Problem Relation Age of Onset  . Scleroderma Mother   . Dementia Father    Social History   Socioeconomic History  . Marital status: Married    Spouse name: Meryl Crutch  . Number of children: 1  . Years of education: Not on file  . Highest education level: Not  on file  Occupational History  . Occupation: retired  Tobacco Use  . Smoking status: Never Smoker  . Smokeless tobacco: Never Used  Substance and Sexual Activity  . Alcohol use: No  . Drug use: No  . Sexual activity: Not on file  Other Topics Concern  . Not on file  Social History Narrative   Married to [facility] - 2   1 daughter lives next door   grandchildren   Social Determinants of Health   Financial Resource Strain: Low Risk   . Difficulty of Paying Living Expenses: Not hard at all  Food Insecurity: No Food Insecurity  .  Worried About Programme researcher, broadcasting/film/video in the Last Year: Never true  . Ran Out of Food in the Last Year: Never true  Transportation Needs: No Transportation Needs  . Lack of Transportation (Medical): No  . Lack of Transportation (Non-Medical): No  Physical Activity: Insufficiently Active  . Days of Exercise per Week: 2 days  . Minutes of Exercise per Session: 60 min  Stress:   . Feeling of Stress : Not on file  Social Connections: Not Isolated  . Frequency of Communication with Friends and Family: More than three times a week  . Frequency of Social Gatherings with Friends and Family: More than three times a week  . Attends Religious Services: More than 4 times per year  . Active Member of Clubs or Organizations: Yes  . Attends Banker Meetings: More than 4 times per year  . Marital Status: Married    Outpatient Encounter Medications as of 11/30/2019  Medication Sig  . aspirin EC 81 MG tablet Take 81 mg by mouth daily.  . fluticasone (FLONASE) 50 MCG/ACT nasal spray 1 SPRAY NASALLY ONCE A DAY  . magnesium oxide (MAG-OX) 400 (241.3 Mg) MG tablet Take by mouth.  . metFORMIN (GLUCOPHAGE) 500 MG tablet Take 2 tablets (1,000 mg total) by mouth 2 (two) times daily with a meal.  . rosuvastatin (CRESTOR) 5 MG tablet Take 1 tablet (5 mg total) by mouth daily.  . sildenafil (VIAGRA) 100 MG tablet Take 100 mg by mouth daily as needed. For ED  . valsartan-hydrochlorothiazide (DIOVAN-HCT) 160-12.5 MG tablet Take 1 tablet by mouth at bedtime.  Marland Kitchen ACCU-CHEK FASTCLIX LANCETS MISC Test once daily Dx E11.9 (Patient not taking: Reported on 11/30/2019)  . glucose blood (ACCU-CHEK AVIVA) test strip Test once daily. E11.9 (Patient not taking: Reported on 11/30/2019)  . Magnesium 400 MG CAPS Take 1 tablet by mouth daily.  . sucralfate (CARAFATE) 1 g tablet Take 1 tablet (1 g total) by mouth 4 (four) times daily - with mealsand at bedtime. (Patient not taking: Reported on 11/30/2019)   No  facility-administered encounter medications on file as of 11/30/2019.    Activities of Daily Living In your present state of health, do you have any difficulty performing the following activities: 11/30/2019  Hearing? N  Vision? N  Difficulty concentrating or making decisions? N  Walking or climbing stairs? N  Dressing or bathing? N  Doing errands, shopping? N  Preparing Food and eating ? N  Using the Toilet? N  In the past six months, have you accidently leaked urine? N  Do you have problems with loss of bowel control? N  Managing your Medications? N  Managing your Finances? N  Housekeeping or managing your Housekeeping? N  Some recent data might be hidden    Patient Care Team: Kristian Covey, MD as PCP -  General   Assessment:   This is a routine wellness examination for Clifford Gibson.  Exercise Activities and Dietary recommendations Current Exercise Habits: The patient does not participate in regular exercise at present, Exercise limited by: None identified  Goals    . DIET - INCREASE WATER INTAKE     6-8 glasses daily     . Increase physical activity     Goal of physical activity 5 times a week and 30 minutes each episode       Fall Risk Fall Risk  11/30/2019 07/23/2019 03/10/2018 10/28/2017 09/24/2016  Falls in the past year? 0 0 No No No  Risk for fall due to : Medication side effect - - - -  Follow up Falls evaluation completed;Education provided;Falls prevention discussed - - - -   Is the patient's home free of loose throw rugs in walkways, pet beds, electrical cords, etc?   yes      Grab bars in the bathroom? yes      Handrails on the stairs?   yes; no stairs in home.      Adequate lighting?   yes  Timed Get Up and Go Performed: N/A due to telephone visit  Depression Screen PHQ 2/9 Scores 11/30/2019 07/23/2019 03/10/2018 10/28/2017  PHQ - 2 Score 0 0 0 0  PHQ- 9 Score - 0 - -    Cognitive Function MMSE - Mini Mental State Exam 03/10/2018 09/24/2016  Not  completed: (No Data) (No Data)     6CIT Screen 11/30/2019  What Year? 0 points  What month? 0 points  What time? 0 points  Count back from 20 0 points  Months in reverse 0 points  Repeat phrase 0 points  Total Score 0    Immunization History  Administered Date(s) Administered  . Influenza Whole 10/03/2011  . Influenza, High Dose Seasonal PF 09/24/2016  . Influenza,inj,Quad PF,6+ Mos 09/11/2018  . Influenza-Unspecified 10/14/2015, 09/12/2017, 09/14/2018  . Pneumococcal Conjugate-13 02/03/2014  . Pneumococcal Polysaccharide-23 03/28/2010, 09/14/2018  . Td 01/07/2008  . Tdap 12/01/2018  . Zoster Recombinat (Shingrix) 09/14/2018, 11/17/2018    Qualifies for Shingles Vaccine? Patient has completed Shingrix series  Screening Tests Health Maintenance  Topic Date Due  . INFLUENZA VACCINE  07/03/2019  . HEMOGLOBIN A1C  01/23/2020  . OPHTHALMOLOGY EXAM  04/28/2020  . FOOT EXAM  07/22/2020  . COLONOSCOPY  07/08/2026  . TETANUS/TDAP  12/01/2028  . Hepatitis C Screening  Completed  . PNA vac Low Risk Adult  Completed   Cancer Screenings: Lung: Low Dose CT Chest recommended if Age 31-80 years, 30 pack-year currently smoking OR have quit w/in 15years. Patient does not qualify. Colorectal: Yes  Additional Screenings:  Hepatitis C Screening:completed 10/28/2017      Plan:   Recommend Clifford Gibson drink more water and increase physical exercise to at least 5 days a week; 30 minutes each episode.   I have personally reviewed and noted the following in the patient's chart:   . Medical and social history . Use of alcohol, tobacco or illicit drugs  . Current medications and supplements . Functional ability and status . Nutritional status . Physical activity . Advanced directives . List of other physicians . Hospitalizations, surgeries, and ER visits in previous 12 months . Vitals . Screenings to include cognitive, depression, and falls . Referrals and appointments  In  addition, I have reviewed and discussed with patient certain preventive protocols, quality metrics, and best practice recommendations. A written personalized care plan for preventive services  as well as general preventive health recommendations were provided to patient.     Nathaniel Man, LPN  [id number]

## 2019-12-25 ENCOUNTER — Other Ambulatory Visit: Payer: Self-pay | Admitting: Family Medicine

## 2020-04-24 ENCOUNTER — Other Ambulatory Visit: Payer: Self-pay

## 2020-04-24 ENCOUNTER — Encounter: Payer: Self-pay | Admitting: Family Medicine

## 2020-04-24 ENCOUNTER — Ambulatory Visit (INDEPENDENT_AMBULATORY_CARE_PROVIDER_SITE_OTHER): Payer: Medicare PPO | Admitting: Family Medicine

## 2020-04-24 VITALS — BP 138/76 | HR 72 | Temp 98.3°F | Wt 218.3 lb

## 2020-04-24 DIAGNOSIS — L02413 Cutaneous abscess of right upper limb: Secondary | ICD-10-CM

## 2020-04-24 DIAGNOSIS — G3184 Mild cognitive impairment, so stated: Secondary | ICD-10-CM

## 2020-04-24 DIAGNOSIS — E119 Type 2 diabetes mellitus without complications: Secondary | ICD-10-CM | POA: Diagnosis not present

## 2020-04-24 DIAGNOSIS — R5383 Other fatigue: Secondary | ICD-10-CM

## 2020-04-24 MED ORDER — DOXYCYCLINE HYCLATE 100 MG PO CAPS: 100.0000 mg | ORAL_CAPSULE | Freq: Two times a day (BID) | ORAL | 0 refills | Status: DC

## 2020-04-24 NOTE — Progress Notes (Signed)
Subjective:     Patient ID: Clifford Gibson, male   DOB: 14-Mar-1946, 74 y.o.   MRN: [id number]  HPI   Clifford Gibson is seen today accompanied by wife with the following issues  Painful pustular lesion right forearm.  First noted over the weekend.  He initially thought there may be in some kind of insect bite.  No history of MRSA.  This is slightly pruritic but also tender to palpation.  Has had no fevers or chills.  No nausea or vomiting.  Denies any other skin lesions.  Other issue is wife brings up concerned that both she and several family members have noticed a change in him over the past year.  He has been less motivated to get up and do things and has been more lethargic and less engaged.  Wife is concerned because multiple family members including his mother had dementia.  They have not noted him repeating himself.  Clifford Gibson denies any depression symptoms.  Sleeping actually extra and sometimes several times per day.  She states that he does snore frequently and sometimes has "noisy breathing ".  Never studied for sleep apnea previously  Past Medical History:  Diagnosis Date  . Acute serous otitis media 02/22/2010  . Arthritis   . Diabetes mellitus without complication ([facility])   . ERECTILE DYSFUNCTION 04/25/2009  . GERD 03/24/2009  . HYPERLIPIDEMIA 03/28/2010  . HYPERTENSION 03/24/2009  . OBESITY, MODERATE 04/25/2009  . PONV (postoperative nausea and vomiting)    Past Surgical History:  Procedure Laterality Date  . COLONOSCOPY N/A 06/21/2015   Procedure: COLONOSCOPY;  Surgeon: Malissa Hippo, MD;  [facility];  Service: Endoscopy;  Laterality: N/A;  930 - moved to 7/20 @ 7:30  . NASAL SEPTUM SURGERY  1974  . TONSILLECTOMY     3 yrs. old  . TOTAL KNEE ARTHROPLASTY  06/11/2012   Procedure: TOTAL KNEE ARTHROPLASTY;  Surgeon: Javier Docker, MD;  Location: [facility];  Service: Orthopedics;  Laterality: Right;    reports that he has never smoked. He has never used smokeless tobacco. He  reports that he does not drink alcohol or use drugs. family history includes Dementia in his father; Scleroderma in his mother. Allergies  Allergen Reactions  . Lipitor [Atorvastatin Calcium] Other (See Comments)  . Penicillins Other (See Comments)    Reaction=bleeding,diarrhea     Review of Systems  Constitutional: Positive for fatigue. Negative for appetite change, chills, fever and unexpected weight change.  Respiratory: Negative for cough and shortness of breath.   Cardiovascular: Negative for chest pain.  Gastrointestinal: Negative for abdominal pain.  Neurological: Negative for dizziness, syncope, speech difficulty and headaches.  Hematological: Negative for adenopathy.  Psychiatric/Behavioral: Negative for confusion and suicidal ideas. The patient is not nervous/anxious.        Objective:   Physical Exam Vitals reviewed.  Constitutional:      Appearance: Normal appearance.  Cardiovascular:     Rate and Rhythm: Normal rate and regular rhythm.  Pulmonary:     Effort: Pulmonary effort is normal.     Breath sounds: Normal breath sounds.  Skin:    Comments: He has approximately 2 x 2 centimeter area of erythema dorsum right forearm and approximately 1 x 1 cm area of induration with slightly pustular center that is draining some pus.  There is no fluctuance.  Minimally tender  Neurological:     General: No focal deficit present.     Mental Status: He is alert.  Cranial Nerves: No cranial nerve deficit.     Motor: No weakness.     Deep Tendon Reflexes: Reflexes normal.  Psychiatric:     Comments: MMSE 30/30        Assessment:     #1 pustular lesion right forearm.  Suspect staph.  Patient nontoxic.  Needs coverage for MRSA  #2 concern for possible mild cognitive impairment.  He actually scored 30 out of 30 on MMSE  #3 increasing fatigue.  Rule out hypothyroidism.  Consider sleep studies to rule out obstructive sleep apnea      Plan:     -Recommend labs  including TSH, B12, comprehensive metabolic panel, D4Y and he will have to return tomorrow for those  -If above labs normal consider setting up referral for rule out obstructive sleep apnea  -Start doxycycline 100 mg twice daily for 10 days  -Follow-up immediately for any fever, progressive redness, or any other concerns  Clifford Post MD [facility]

## 2020-04-24 NOTE — Patient Instructions (Signed)
Keep clean daily with soap and water  Start the Doxycycline twice daily  Follow up for any fever or increased redness.

## 2020-04-25 ENCOUNTER — Other Ambulatory Visit (INDEPENDENT_AMBULATORY_CARE_PROVIDER_SITE_OTHER): Payer: Medicare PPO

## 2020-04-25 DIAGNOSIS — E119 Type 2 diabetes mellitus without complications: Secondary | ICD-10-CM | POA: Diagnosis not present

## 2020-04-25 DIAGNOSIS — R5383 Other fatigue: Secondary | ICD-10-CM | POA: Diagnosis not present

## 2020-04-25 DIAGNOSIS — G3184 Mild cognitive impairment, so stated: Secondary | ICD-10-CM | POA: Diagnosis not present

## 2020-04-25 LAB — CBC WITH DIFFERENTIAL/PLATELET
Basophils Absolute: 0.1 10*3/uL (ref 0.0–0.1)
Basophils Relative: 0.7 % (ref 0.0–3.0)
Eosinophils Absolute: 0.1 10*3/uL (ref 0.0–0.7)
Eosinophils Relative: 1 % (ref 0.0–5.0)
HCT: 41 % (ref 39.0–52.0)
Hemoglobin: 14 g/dL (ref 13.0–17.0)
Lymphocytes Relative: 30.6 % (ref 12.0–46.0)
Lymphs Abs: 2.8 10*3/uL (ref 0.7–4.0)
MCHC: 34.2 g/dL (ref 30.0–36.0)
MCV: 87 fl (ref 78.0–100.0)
Monocytes Absolute: 0.6 10*3/uL (ref 0.1–1.0)
Monocytes Relative: 6.7 % (ref 3.0–12.0)
Neutro Abs: 5.6 10*3/uL (ref 1.4–7.7)
Neutrophils Relative %: 61 % (ref 43.0–77.0)
Platelets: 290 10*3/uL (ref 150.0–400.0)
RBC: 4.71 Mil/uL (ref 4.22–5.81)
RDW: 14.9 % (ref 11.5–15.5)
WBC: 9.3 10*3/uL (ref 4.0–10.5)

## 2020-04-25 LAB — HEPATIC FUNCTION PANEL
ALT: 14 U/L (ref 0–53)
AST: 13 U/L (ref 0–37)
Albumin: 4.7 g/dL (ref 3.5–5.2)
Alkaline Phosphatase: 47 U/L (ref 39–117)
Bilirubin, Direct: 0.1 mg/dL (ref 0.0–0.3)
Total Bilirubin: 0.5 mg/dL (ref 0.2–1.2)
Total Protein: 6.7 g/dL (ref 6.0–8.3)

## 2020-04-25 LAB — HEMOGLOBIN A1C: Hgb A1c MFr Bld: 6.6 % — ABNORMAL HIGH (ref 4.6–6.5)

## 2020-04-25 LAB — BASIC METABOLIC PANEL
BUN: 21 mg/dL (ref 6–23)
CO2: 32 mEq/L (ref 19–32)
Calcium: 10.1 mg/dL (ref 8.4–10.5)
Chloride: 100 mEq/L (ref 96–112)
Creatinine, Ser: 1.13 mg/dL (ref 0.40–1.50)
GFR: 63.2 mL/min (ref >60.00)
Glucose, Bld: 129 mg/dL — ABNORMAL HIGH (ref 70–99)
Potassium: 4.3 mEq/L (ref 3.5–5.1)
Sodium: 139 mEq/L (ref 135–145)

## 2020-04-25 LAB — VITAMIN B12: Vitamin B-12: 373 pg/mL (ref 211–911)

## 2020-04-25 LAB — TSH: TSH: 1.39 u[IU]/mL (ref 0.35–4.50)

## 2020-04-28 DIAGNOSIS — H52203 Unspecified astigmatism, bilateral: Secondary | ICD-10-CM | POA: Diagnosis not present

## 2020-04-28 DIAGNOSIS — H5203 Hypermetropia, bilateral: Secondary | ICD-10-CM | POA: Diagnosis not present

## 2020-04-28 DIAGNOSIS — E119 Type 2 diabetes mellitus without complications: Secondary | ICD-10-CM | POA: Diagnosis not present

## 2020-04-28 LAB — HM DIABETES EYE EXAM

## 2020-05-02 ENCOUNTER — Encounter: Payer: Self-pay | Admitting: Family Medicine

## 2020-06-23 ENCOUNTER — Other Ambulatory Visit: Payer: Self-pay | Admitting: Family Medicine

## 2020-06-23 DIAGNOSIS — I10 Essential (primary) hypertension: Secondary | ICD-10-CM

## 2020-07-14 ENCOUNTER — Other Ambulatory Visit: Payer: Self-pay | Admitting: Family Medicine

## 2020-10-19 ENCOUNTER — Other Ambulatory Visit: Payer: Self-pay | Admitting: Family Medicine

## 2020-11-21 ENCOUNTER — Telehealth: Payer: Self-pay | Admitting: Family Medicine

## 2020-11-21 NOTE — Telephone Encounter (Signed)
Left message for patient to call back and schedule Medicare Annual Wellness Visit (AWV) either virtually or in office.   Last AWV 11/30/19 please schedule at anytime with [facility] Nurse Health Advisor 1 or 2   This should be a 45 minute visit.

## 2020-12-05 DIAGNOSIS — M79671 Pain in right foot: Secondary | ICD-10-CM | POA: Diagnosis not present

## 2020-12-05 DIAGNOSIS — M76821 Posterior tibial tendinitis, right leg: Secondary | ICD-10-CM | POA: Diagnosis not present

## 2021-01-25 ENCOUNTER — Telehealth: Payer: Self-pay | Admitting: Family Medicine

## 2021-01-25 NOTE — Telephone Encounter (Signed)
Left message for patient to call back and schedule Medicare Annual Wellness Visit (AWV) either virtually or in office.  No detailed message left  Last AWV 11/30/2019 please schedule at anytime with [facility] Nurse Health Advisor 1 or 2   This should be a 45 minute visit.

## 2021-02-09 ENCOUNTER — Other Ambulatory Visit: Payer: Self-pay | Admitting: Family Medicine

## 2021-02-09 DIAGNOSIS — I10 Essential (primary) hypertension: Secondary | ICD-10-CM

## 2021-02-14 NOTE — Progress Notes (Addendum)
Subjective:   Clifford Gibson is a 75 y.o. male who presents for Medicare Annual/Subsequent preventive examination.  Review of Systems    N/A  Cardiac Risk Factors include: advanced age (>5255men, 76>65 women);hypertension;male gender;obesity (BMI >30kg/m2)     Objective:    Today's Vitals   02/15/21 0900  BP: 120/78  Pulse: 83  Temp: 98.4 F (36.9 C)  TempSrc: Oral  SpO2: 97%  Weight: 221 lb 3 oz (100.3 kg)  Height: 5\' 8"  (1.727 m)   Body mass index is 33.63 kg/m.  Advanced Directives 02/15/2021 11/30/2019 12/01/2018 03/10/2018 09/24/2016 11/02/2015 06/21/2015  Does Patient Have a Medical Advance Directive? Yes Yes Yes Yes Yes Yes Yes  Type of Estate agentAdvance Directive Healthcare Power of [place];Living will Healthcare Power of [place];Living will Living will - - - Living will  Does patient want to make changes to medical advance directive? No - Patient declined No - Patient declined - - - - -  Copy of Healthcare Power of Attorney in Chart? No - copy requested No - copy requested - - - - No - copy requested  Would patient like information on creating a medical advance directive? - - - - - - No - patient declined information  Pre-existing out of facility DNR order (yellow form or pink MOST form) - - - - - - -    Current Medications (verified) Outpatient Encounter Medications as of 02/15/2021  Medication Sig  . ACCU-CHEK FASTCLIX LANCETS MISC Test once daily Dx E11.9  . aspirin EC 81 MG tablet Take 81 mg by mouth daily.  . fluticasone (FLONASE) 50 MCG/ACT nasal spray 1 SPRAY NASALLY ONCE A DAY  . glucose blood (ACCU-CHEK AVIVA) test strip Test once daily. E11.9  . meloxicam (MOBIC) 15 MG tablet Take 15 mg by mouth daily.  . metFORMIN (GLUCOPHAGE) 500 MG tablet TAKE 2 TABLETS TWO TIMES DAILY WITH A MEAL  . Omega 3 1200 MG CAPS Take by mouth.  . valsartan-hydrochlorothiazide (DIOVAN-HCT) 160-12.5 MG tablet TAKE 1 TABLET AT BEDTIME  . vitamin C (ASCORBIC ACID) 500 MG tablet Take 500 mg  by mouth daily.  . rosuvastatin (CRESTOR) 5 MG tablet Take 1 tablet (5 mg total) by mouth daily. (Patient not taking: Reported on 02/15/2021)  . sildenafil (VIAGRA) 100 MG tablet Take 100 mg by mouth daily as needed. For ED (Patient not taking: Reported on 02/15/2021)  . sucralfate (CARAFATE) 1 g tablet Take 1 tablet (1 g total) by mouth 4 (four) times daily - with mealsand at bedtime. (Patient not taking: Reported on 02/15/2021)  . [DISCONTINUED] doxycycline (VIBRAMYCIN) 100 MG capsule Take 1 capsule (100 mg total) by mouth 2 (two) times daily.  . [DISCONTINUED] Magnesium 400 MG CAPS Take 1 tablet by mouth daily. (Patient not taking: Reported on 02/15/2021)  . [DISCONTINUED] magnesium oxide (MAG-OX) 400 (241.3 Mg) MG tablet Take by mouth.   No facility-administered encounter medications on file as of 02/15/2021.    Allergies (verified) Lipitor [atorvastatin calcium] and Penicillins   History: Past Medical History:  Diagnosis Date  . Acute serous otitis media 02/22/2010  . Arthritis   . Diabetes mellitus without complication (HCC)   . ERECTILE DYSFUNCTION 04/25/2009  . GERD 03/24/2009  . HYPERLIPIDEMIA 03/28/2010  . HYPERTENSION 03/24/2009  . OBESITY, MODERATE 04/25/2009  . PONV (postoperative nausea and vomiting)    Past Surgical History:  Procedure Laterality Date  . COLONOSCOPY N/A 06/21/2015   Procedure: COLONOSCOPY;  Surgeon: Malissa HippoNajeeb U Rehman, MD;  Location: [facility];  Service: Endoscopy;  Laterality: N/A;  930 - moved to 7/20 @ 7:30  . NASAL SEPTUM SURGERY  1974  . TONSILLECTOMY     3 yrs. old  . TOTAL KNEE ARTHROPLASTY  06/11/2012   Procedure: TOTAL KNEE ARTHROPLASTY;  Surgeon: Javier Docker, MD;  Location: [facility];  Service: Orthopedics;  Laterality: Right;   Family History  Problem Relation Age of Onset  . Scleroderma Mother   . Dementia Father    Social History   Socioeconomic History  . Marital status: Married    Spouse name: Cordelia Pen  . Number of children: 1  . Years  of education: Not on file  . Highest education level: Not on file  Occupational History  . Occupation: retired  Tobacco Use  . Smoking status: Never Smoker  . Smokeless tobacco: Never Used  Vaping Use  . Vaping Use: Never used  Substance and Sexual Activity  . Alcohol use: No  . Drug use: No  . Sexual activity: Not on file  Other Topics Concern  . Not on file  Social History Narrative   Married to [facility] - 2   1 daughter lives next door   grandchildren   Social Determinants of Health   Financial Resource Strain: Low Risk   . Difficulty of Paying Living Expenses: Not hard at all  Food Insecurity: No Food Insecurity  . Worried About Programme researcher, broadcasting/film/video in the Last Year: Never true  . Ran Out of Food in the Last Year: Never true  Transportation Needs: No Transportation Needs  . Lack of Transportation (Medical): No  . Lack of Transportation (Non-Medical): No  Physical Activity: Insufficiently Active  . Days of Exercise per Week: 6 days  . Minutes of Exercise per Session: 20 min  Stress: No Stress Concern Present  . Feeling of Stress : Not at all  Social Connections: Socially Integrated  . Frequency of Communication with Friends and Family: More than three times a week  . Frequency of Social Gatherings with Friends and Family: More than three times a week  . Attends Religious Services: More than 4 times per year  . Active Member of Clubs or Organizations: Yes  . Attends Banker Meetings: More than 4 times per year  . Marital Status: Married    Tobacco Counseling Counseling given: Not Answered   Clinical Intake:  Pre-visit preparation completed: Yes  Pain : No/denies pain     Nutritional Risks: None Diabetes: Yes CBG done?: No Did pt. bring in CBG monitor from home?: No  How often do you need to have someone help you when you read instructions, pamphlets, or other written materials from your doctor or pharmacy?: 1 - Never  Diabetic?  Yes Nutrition Risk Assessment:  Has the patient had any N/V/D within the last 2 months?  No  Does the patient have any non-healing wounds?  No  Has the patient had any unintentional weight loss or weight gain?  No   Diabetes:  Is the patient diabetic?  Yes  If diabetic, was a CBG obtained today?  No  Did the patient bring in their glucometer from home?  No  How often do you monitor your CBG's? States checks glucose every other day.   Financial Strains and Diabetes Management:  Are you having any financial strains with the device, your supplies or your medication? No .  Does the patient want to be seen by Chronic Care Management for management of their diabetes?  No  Would the patient like to be referred to a Nutritionist or for Diabetic Management?  No   Diabetic Exams:  Diabetic Eye Exam: Completed 04/28/2020 Diabetic Foot Exam: Overdue, Pt has been advised about the importance in completing this exam. Pt is scheduled for diabetic foot exam on 02/19/2021.   Interpreter Needed?: No  Information entered by :: SCrews,LPN   Activities of Daily Living In your present state of health, do you have any difficulty performing the following activities: 02/15/2021  Hearing? Y  Comment has some decreased hearing  Vision? N  Difficulty concentrating or making decisions? N  Walking or climbing stairs? N  Dressing or bathing? N  Doing errands, shopping? N  Preparing Food and eating ? N  Using the Toilet? N  In the past six months, have you accidently leaked urine? N  Do you have problems with loss of bowel control? N  Managing your Medications? N  Managing your Finances? N  Housekeeping or managing your Housekeeping? N  Some recent data might be hidden    Patient Care Team: Kristian Covey, MD as PCP - General  Indicate any recent Medical Services you may have received from other than [facility] providers in the past year (date may be approximate).     Assessment:   This is a  routine wellness examination for Clifford Gibson.  Hearing/Vision screen  Hearing Screening   125Hz  250Hz  500Hz  1000Hz  2000Hz  3000Hz  4000Hz  6000Hz  8000Hz   Right ear:           Left ear:           Vision Screening Comments: Gets eye exams once per year. Due this may. Wears reading glasses   Dietary issues and exercise activities discussed: Current Exercise Habits: Home exercise routine, Time (Minutes): 20, Frequency (Times/Week): 6, Weekly Exercise (Minutes/Week): 120, Intensity: Mild, Exercise limited by: None identified (Stationary bike)  Goals    . DIET - INCREASE WATER INTAKE     6-8 glasses daily     . Increase physical activity     Goal of physical activity 5 times a week and 30 minutes each episode    . Weight (lb) < 190 lb (86.2 kg)      Depression Screen PHQ 2/9 Scores 02/15/2021 11/30/2019 07/23/2019 03/10/2018 10/28/2017 09/24/2016 11/21/2015  PHQ - 2 Score 0 0 0 0 0 0 0  PHQ- 9 Score - - 0 - - - -    Fall Risk Fall Risk  02/15/2021 11/30/2019 07/23/2019 03/10/2018 10/28/2017  Falls in the past year? 0 0 0 No No  Number falls in past yr: 0 - - - -  Injury with Fall? 0 - - - -  Risk for fall due to : No Fall Risks Medication side effect - - -  Follow up Falls evaluation completed;Falls prevention discussed Falls evaluation completed;Education provided;Falls prevention discussed - - -    FALL RISK PREVENTION PERTAINING TO THE HOME:  Any stairs in or around the home? Yes  If so, are there any without handrails? No  Home free of loose throw rugs in walkways, pet beds, electrical cords, etc? Yes  Adequate lighting in your home to reduce risk of falls? Yes   ASSISTIVE DEVICES UTILIZED TO PREVENT FALLS:  Life alert? No  Use of a cane, walker or w/c? No  Grab bars in the bathroom? Yes  Shower chair or bench in shower? No  Elevated toilet seat or a handicapped toilet? Yes   TIMED UP AND GO:  Was the test performed? Yes .  Length of time to ambulate 10 feet: 4 sec.   Gait  steady and fast without use of assistive device  Cognitive Function: MMSE - Mini Mental State Exam 03/10/2018 09/24/2016  Not completed: (No Data) (No Data)     6CIT Screen 11/30/2019  What Year? 0 points  What month? 0 points  What time? 0 points  Count back from 20 0 points  Months in reverse 0 points  Repeat phrase 0 points  Total Score 0    Immunizations Immunization History  Administered Date(s) Administered  . Fluad Quad(high Dose 65+) 09/18/2020  . Influenza Whole 10/03/2011  . Influenza, High Dose Seasonal PF 09/24/2016  . Influenza,inj,Quad PF,6+ Mos 09/11/2018, 08/26/2019  . Influenza-Unspecified 10/14/2015, 09/12/2017, 09/14/2018  . Pneumococcal Conjugate-13 02/03/2014  . Pneumococcal Polysaccharide-23 03/28/2010, 09/14/2018  . Td 01/07/2008  . Tdap 12/01/2018  . Zoster Recombinat (Shingrix) 09/14/2018, 11/17/2018    TDAP status: Up to date  Flu Vaccine status: Up to date  Pneumococcal vaccine status: Up to date  Covid-19 vaccine status: Completed vaccines  Qualifies for Shingles Vaccine? Yes   Zostavax completed No   Shingrix Completed?: Yes  Screening Tests Health Maintenance  Topic Date Due  . COVID-19 Vaccine (1) Never done  . FOOT EXAM  07/22/2020  . HEMOGLOBIN A1C  10/26/2020  . OPHTHALMOLOGY EXAM  04/28/2021  . TETANUS/TDAP  12/01/2028  . INFLUENZA VACCINE  Completed  . Hepatitis C Screening  Completed  . PNA vac Low Risk Adult  Completed  . HPV VACCINES  Aged Out    Health Maintenance  Health Maintenance Due  Topic Date Due  . COVID-19 Vaccine (1) Never done  . FOOT EXAM  07/22/2020  . HEMOGLOBIN A1C  10/26/2020    Colorectal cancer screening: Type of screening: Colonoscopy. Completed 07/08/2016. Repeat every 10 years  Lung Cancer Screening: (Low Dose CT Chest recommended if Age 48-80 years, 30 pack-year currently smoking OR have quit w/in 15years.) does not qualify.   Lung Cancer Screening Referral: N/A  Additional  Screening:  Hepatitis C Screening: does qualify; Completed 10/28/2017  Vision Screening: Recommended annual ophthalmology exams for early detection of glaucoma and other disorders of the eye. Is the patient up to date with their annual eye exam?  Yes  Who is the provider or what is the name of the office in which the patient attends annual eye exams? [facility]  If pt is not established with a provider, would they like to be referred to a provider to establish care? No .   Dental Screening: Recommended annual dental exams for proper oral hygiene  Community Resource Referral / Chronic Care Management: CRR required this visit?  No   CCM required this visit?  No      Plan:     I have personally reviewed and noted the following in the patient's chart:   . Medical and social history . Use of alcohol, tobacco or illicit drugs  . Current medications and supplements . Functional ability and status . Nutritional status . Physical activity . Advanced directives . List of other physicians . Hospitalizations, surgeries, and ER visits in previous 12 months . Vitals . Screenings to include cognitive, depression, and falls . Referrals and appointments  In addition, I have reviewed and discussed with patient certain preventive protocols, quality metrics, and best practice recommendations. A written personalized care plan for preventive services as well as general preventive health recommendations were provided to patient.  Theodora Blow, LPN   03/10/8118   Nurse Notes: None

## 2021-02-15 ENCOUNTER — Ambulatory Visit (INDEPENDENT_AMBULATORY_CARE_PROVIDER_SITE_OTHER): Payer: Medicare PPO

## 2021-02-15 ENCOUNTER — Other Ambulatory Visit: Payer: Self-pay

## 2021-02-15 VITALS — BP 120/78 | HR 83 | Temp 98.4°F | Ht 68.0 in | Wt 221.2 lb

## 2021-02-15 DIAGNOSIS — Z0000 Encounter for general adult medical examination without abnormal findings: Secondary | ICD-10-CM

## 2021-02-15 NOTE — Patient Instructions (Signed)
Clifford Gibson , Thank you for taking time to come for your Medicare Wellness Visit. I appreciate your ongoing commitment to your health goals. Please review the following plan we discussed and let me know if I can assist you in the future.   Screening recommendations/referrals: Colonoscopy: No longer required  Recommended yearly ophthalmology/optometry visit for glaucoma screening and checkup Recommended yearly dental visit for hygiene and checkup  Vaccinations: Influenza vaccine: Up to date, next due fall 2022  Pneumococcal vaccine: Completed series  Tdap vaccine: Up to date, next due 12/01/2028 Shingles vaccine: Completed series     Advanced directives: Please bring a copy of your advanced medical directives to our office so that we may scan them into your chart.   Conditions/risks identified: None   Next appointment: 02/19/2021 @ 9:15 am with Dr. Caryl Never  Preventive Care 36 Years and Older, Male Preventive care refers to lifestyle choices and visits with your health care provider that can promote health and wellness. What does preventive care include?  A yearly physical exam. This is also called an annual well check.  Dental exams once or twice a year.  Routine eye exams. Ask your health care provider how often you should have your eyes checked.  Personal lifestyle choices, including:  Daily care of your teeth and gums.  Regular physical activity.  Eating a healthy diet.  Avoiding tobacco and drug use.  Limiting alcohol use.  Practicing safe sex.  Taking low doses of aspirin every day.  Taking vitamin and mineral supplements as recommended by your health care provider. What happens during an annual well check? The services and screenings done by your health care provider during your annual well check will depend on your age, overall health, lifestyle risk factors, and family history of disease. Counseling  Your health care provider may ask you questions about  your:  Alcohol use.  Tobacco use.  Drug use.  Emotional well-being.  Home and relationship well-being.  Sexual activity.  Eating habits.  History of falls.  Memory and ability to understand (cognition).  Work and work Astronomer. Screening  You may have the following tests or measurements:  Height, weight, and BMI.  Blood pressure.  Lipid and cholesterol levels. These may be checked every 5 years, or more frequently if you are over 2 years old.  Skin check.  Lung cancer screening. You may have this screening every year starting at age 103 if you have a 30-pack-year history of smoking and currently smoke or have quit within the past 15 years.  Fecal occult blood test (FOBT) of the stool. You may have this test every year starting at age 54.  Flexible sigmoidoscopy or colonoscopy. You may have a sigmoidoscopy every 5 years or a colonoscopy every 10 years starting at age 86.  Prostate cancer screening. Recommendations will vary depending on your family history and other risks.  Hepatitis C blood test.  Hepatitis B blood test.  Sexually transmitted disease (STD) testing.  Diabetes screening. This is done by checking your blood sugar (glucose) after you have not eaten for a while (fasting). You may have this done every 1-3 years.  Abdominal aortic aneurysm (AAA) screening. You may need this if you are a current or former smoker.  Osteoporosis. You may be screened starting at age 54 if you are at high risk. Talk with your health care provider about your test results, treatment options, and if necessary, the need for more tests. Vaccines  Your health care provider may recommend certain  vaccines, such as:  Influenza vaccine. This is recommended every year.  Tetanus, diphtheria, and acellular pertussis (Tdap, Td) vaccine. You may need a Td booster every 10 years.  Zoster vaccine. You may need this after age 26.  Pneumococcal 13-valent conjugate (PCV13) vaccine.  One dose is recommended after age 46.  Pneumococcal polysaccharide (PPSV23) vaccine. One dose is recommended after age 29. Talk to your health care provider about which screenings and vaccines you need and how often you need them. This information is not intended to replace advice given to you by your health care provider. Make sure you discuss any questions you have with your health care provider. Document Released: 12/15/2015 Document Revised: 08/07/2016 Document Reviewed: 09/19/2015 Elsevier Interactive Patient Education  2017 [place] Prevention in the Home Falls can cause injuries. They can happen to people of all ages. There are many things you can do to make your home safe and to help prevent falls. What can I do on the outside of my home?  Regularly fix the edges of walkways and driveways and fix any cracks.  Remove anything that might make you trip as you walk through a door, such as a raised step or threshold.  Trim any bushes or trees on the path to your home.  Use bright outdoor lighting.  Clear any walking paths of anything that might make someone trip, such as rocks or tools.  Regularly check to see if handrails are loose or broken. Make sure that both sides of any steps have handrails.  Any raised decks and porches should have guardrails on the edges.  Have any leaves, snow, or ice cleared regularly.  Use sand or salt on walking paths during winter.  Clean up any spills in your garage right away. This includes oil or grease spills. What can I do in the bathroom?  Use night lights.  Install grab bars by the toilet and in the tub and shower. Do not use towel bars as grab bars.  Use non-skid mats or decals in the tub or shower.  If you need to sit down in the shower, use a plastic, non-slip stool.  Keep the floor dry. Clean up any water that spills on the floor as soon as it happens.  Remove soap buildup in the tub or shower regularly.  Attach bath  mats securely with double-sided non-slip rug tape.  Do not have throw rugs and other things on the floor that can make you trip. What can I do in the bedroom?  Use night lights.  Make sure that you have a light by your bed that is easy to reach.  Do not use any sheets or blankets that are too big for your bed. They should not hang down onto the floor.  Have a firm chair that has side arms. You can use this for support while you get dressed.  Do not have throw rugs and other things on the floor that can make you trip. What can I do in the kitchen?  Clean up any spills right away.  Avoid walking on wet floors.  Keep items that you use a lot in easy-to-reach places.  If you need to reach something above you, use a strong step stool that has a grab bar.  Keep electrical cords out of the way.  Do not use floor polish or wax that makes floors slippery. If you must use wax, use non-skid floor wax.  Do not have throw rugs and other things  on the floor that can make you trip. What can I do with my stairs?  Do not leave any items on the stairs.  Make sure that there are handrails on both sides of the stairs and use them. Fix handrails that are broken or loose. Make sure that handrails are as long as the stairways.  Check any carpeting to make sure that it is firmly attached to the stairs. Fix any carpet that is loose or worn.  Avoid having throw rugs at the top or bottom of the stairs. If you do have throw rugs, attach them to the floor with carpet tape.  Make sure that you have a light switch at the top of the stairs and the bottom of the stairs. If you do not have them, ask someone to add them for you. What else can I do to help prevent falls?  Wear shoes that:  Do not have high heels.  Have rubber bottoms.  Are comfortable and fit you well.  Are closed at the toe. Do not wear sandals.  If you use a stepladder:  Make sure that it is fully opened. Do not climb a closed  stepladder.  Make sure that both sides of the stepladder are locked into place.  Ask someone to hold it for you, if possible.  Clearly mark and make sure that you can see:  Any grab bars or handrails.  First and last steps.  Where the edge of each step is.  Use tools that help you move around (mobility aids) if they are needed. These include:  Canes.  Walkers.  Scooters.  Crutches.  Turn on the lights when you go into a dark area. Replace any light bulbs as soon as they burn out.  Set up your furniture so you have a clear path. Avoid moving your furniture around.  If any of your floors are uneven, fix them.  If there are any pets around you, be aware of where they are.  Review your medicines with your doctor. Some medicines can make you feel dizzy. This can increase your chance of falling. Ask your doctor what other things that you can do to help prevent falls. This information is not intended to replace advice given to you by your health care provider. Make sure you discuss any questions you have with your health care provider. Document Released: 09/14/2009 Document Revised: 04/25/2016 Document Reviewed: 12/23/2014 Elsevier Interactive Patient Education  2017 [employer].

## 2021-02-19 ENCOUNTER — Encounter: Payer: Self-pay | Admitting: Family Medicine

## 2021-02-19 ENCOUNTER — Ambulatory Visit: Payer: Medicare PPO | Admitting: Family Medicine

## 2021-02-19 ENCOUNTER — Other Ambulatory Visit: Payer: Self-pay

## 2021-02-19 VITALS — BP 128/76 | HR 92 | Temp 98.0°F | Ht 68.0 in | Wt 223.2 lb

## 2021-02-19 DIAGNOSIS — I10 Essential (primary) hypertension: Secondary | ICD-10-CM | POA: Diagnosis not present

## 2021-02-19 DIAGNOSIS — E119 Type 2 diabetes mellitus without complications: Secondary | ICD-10-CM | POA: Diagnosis not present

## 2021-02-19 DIAGNOSIS — E785 Hyperlipidemia, unspecified: Secondary | ICD-10-CM

## 2021-02-19 LAB — POCT GLYCOSYLATED HEMOGLOBIN (HGB A1C): HbA1c, POC (controlled diabetic range): 6.5 % (ref 0.0–7.0)

## 2021-02-19 LAB — BASIC METABOLIC PANEL
BUN: 26 mg/dL — ABNORMAL HIGH (ref 6–23)
CO2: 30 mEq/L (ref 19–32)
Calcium: 9.9 mg/dL (ref 8.4–10.5)
Chloride: 101 mEq/L (ref 96–112)
Creatinine, Ser: 1.16 mg/dL (ref 0.40–1.50)
GFR: 61.32 mL/min (ref >60.00)
Glucose, Bld: 135 mg/dL — ABNORMAL HIGH (ref 70–99)
Potassium: 4.2 mEq/L (ref 3.5–5.1)
Sodium: 141 mEq/L (ref 135–145)

## 2021-02-19 NOTE — Patient Instructions (Signed)
Diabetes Mellitus and Nutrition, Adult When you have diabetes, or diabetes mellitus, it is very important to have healthy eating habits because your blood sugar (glucose) levels are greatly affected by what you eat and drink. Eating healthy foods in the right amounts, at about the same times every day, can help you:  Control your blood glucose.  Lower your risk of heart disease.  Improve your blood pressure.  Reach or maintain a healthy weight. What can affect my meal plan? Every person with diabetes is different, and each person has different needs for a meal plan. Your health care provider may recommend that you work with a dietitian to make a meal plan that is best for you. Your meal plan may vary depending on factors such as:  The calories you need.  The medicines you take.  Your weight.  Your blood glucose, blood pressure, and cholesterol levels.  Your activity level.  Other health conditions you have, such as heart or kidney disease. How do carbohydrates affect me? Carbohydrates, also called carbs, affect your blood glucose level more than any other type of food. Eating carbs naturally raises the amount of glucose in your blood. Carb counting is a method for keeping track of how many carbs you eat. Counting carbs is important to keep your blood glucose at a healthy level, especially if you use insulin or take certain oral diabetes medicines. It is important to know how many carbs you can safely have in each meal. This is different for every person. Your dietitian can help you calculate how many carbs you should have at each meal and for each snack. How does alcohol affect me? Alcohol can cause a sudden decrease in blood glucose (hypoglycemia), especially if you use insulin or take certain oral diabetes medicines. Hypoglycemia can be a life-threatening condition. Symptoms of hypoglycemia, such as sleepiness, dizziness, and confusion, are similar to symptoms of having too much  alcohol.  Do not drink alcohol if: ? Your health care provider tells you not to drink. ? You are pregnant, may be pregnant, or are planning to become pregnant.  If you drink alcohol: ? Do not drink on an empty stomach. ? Limit how much you use to:  0-1 drink a day for women.  0-2 drinks a day for men. ? Be aware of how much alcohol is in your drink. In the [place] one drink equals one 12 oz bottle of beer (355 mL), one 5 oz glass of wine (148 mL), or one 1 oz glass of hard liquor (44 mL). ? Keep yourself hydrated with water, diet soda, or unsweetened iced tea.  Keep in mind that regular soda, juice, and other mixers may contain a lot of sugar and must be counted as carbs. What are tips for following this plan? Reading food labels  Start by checking the serving size on the "Nutrition Facts" label of packaged foods and drinks. The amount of calories, carbs, fats, and other nutrients listed on the label is based on one serving of the item. Many items contain more than one serving per package.  Check the total grams (g) of carbs in one serving. You can calculate the number of servings of carbs in one serving by dividing the total carbs by 15. For example, if a food has 30 g of total carbs per serving, it would be equal to 2 servings of carbs.  Check the number of grams (g) of saturated fats and trans fats in one serving. Choose foods that have   a low amount or none of these fats.  Check the number of milligrams (mg) of salt (sodium) in one serving. Most people should limit total sodium intake to less than 2,300 mg per day.  Always check the nutrition information of foods labeled as "low-fat" or "nonfat." These foods may be higher in added sugar or refined carbs and should be avoided.  Talk to your dietitian to identify your daily goals for nutrients listed on the label. Shopping  Avoid buying canned, pre-made, or processed foods. These foods tend to be high in fat, sodium, and added  sugar.  Shop around the outside edge of the grocery store. This is where you will most often find fresh fruits and vegetables, bulk grains, fresh meats, and fresh dairy. Cooking  Use low-heat cooking methods, such as baking, instead of high-heat cooking methods like deep frying.  Cook using healthy oils, such as olive, canola, or sunflower oil.  Avoid cooking with butter, cream, or high-fat meats. Meal planning  Eat meals and snacks regularly, preferably at the same times every day. Avoid going long periods of time without eating.  Eat foods that are high in fiber, such as fresh fruits, vegetables, beans, and whole grains. Talk with your dietitian about how many servings of carbs you can eat at each meal.  Eat 4-6 oz (112-168 g) of lean protein each day, such as lean meat, chicken, fish, eggs, or tofu. One ounce (oz) of lean protein is equal to: ? 1 oz (28 g) of meat, chicken, or fish. ? 1 egg. ?  cup (62 g) of tofu.  Eat some foods each day that contain healthy fats, such as avocado, nuts, seeds, and fish.   What foods should I eat? Fruits Berries. Apples. Oranges. Peaches. Apricots. Plums. Grapes. Mango. Papaya. Pomegranate. Kiwi. Cherries. Vegetables Lettuce. Spinach. Leafy greens, including kale, chard, collard greens, and mustard greens. Beets. Cauliflower. Cabbage. Broccoli. Carrots. Green beans. Tomatoes. Peppers. Onions. Cucumbers. Brussels sprouts. Grains Whole grains, such as whole-wheat or whole-grain bread, crackers, tortillas, cereal, and pasta. Unsweetened oatmeal. Quinoa. Brown or wild rice. Meats and other proteins Seafood. Poultry without skin. Lean cuts of poultry and beef. Tofu. Nuts. Seeds. Dairy Low-fat or fat-free dairy products such as milk, yogurt, and cheese. The items listed above may not be a complete list of foods and beverages you can eat. Contact a dietitian for more information. What foods should I avoid? Fruits Fruits canned with  syrup. Vegetables Canned vegetables. Frozen vegetables with butter or cream sauce. Grains Refined white flour and flour products such as bread, pasta, snack foods, and cereals. Avoid all processed foods. Meats and other proteins Fatty cuts of meat. Poultry with skin. Breaded or fried meats. Processed meat. Avoid saturated fats. Dairy Full-fat yogurt, cheese, or milk. Beverages Sweetened drinks, such as soda or iced tea. The items listed above may not be a complete list of foods and beverages you should avoid. Contact a dietitian for more information. Questions to ask a health care provider  Do I need to meet with a diabetes educator?  Do I need to meet with a dietitian?  What number can I call if I have questions?  When are the best times to check my blood glucose? Where to find more information:  American Diabetes Association: diabetes.org  Academy of Nutrition and Dietetics: www.eatright.org  National Institute of Diabetes and Digestive and Kidney Diseases: www.niddk.nih.gov  Association of Diabetes Care and Education Specialists: www.diabeteseducator.org Summary  It is important to have healthy eating   habits because your blood sugar (glucose) levels are greatly affected by what you eat and drink.  A healthy meal plan will help you control your blood glucose and maintain a healthy lifestyle.  Your health care provider may recommend that you work with a dietitian to make a meal plan that is best for you.  Keep in mind that carbohydrates (carbs) and alcohol have immediate effects on your blood glucose levels. It is important to count carbs and to use alcohol carefully. This information is not intended to replace advice given to you by your health care provider. Make sure you discuss any questions you have with your health care provider. Document Revised: 10/26/2019 Document Reviewed: 10/26/2019 Elsevier Patient Education  2021 Elsevier Inc.  

## 2021-02-19 NOTE — Progress Notes (Signed)
Established Patient Office Visit  Subjective:  Patient ID: Clifford Gibson, male    DOB: August 10, 1946  Age: 75 y.o. MRN: [id number]  CC:  Chief Complaint  Patient presents with  . Follow-up    HPI Clifford Gibson presents for medical follow-up.  He has history of hypertension, type 2 diabetes, hyperlipidemia.  He has been intolerant of multiple statins.  He no specifically has had intolerance with Lipitor and most recently low-dose rosuvastatin.  He declines further statin use at this time.  Blood sugars been stable by home checks.  No polyuria or polydipsia.  He remains on Metformin 1000 mg twice daily.  He takes valsartan HCTZ for hypertension.  Blood pressure stable.  No recent dizziness.  No chest pains.  He gets regular eye exams and plans to get follow-up exam in May.  Denies any neuropathy symptoms.  Past Medical History:  Diagnosis Date  . Acute serous otitis media 02/22/2010  . Arthritis   . Diabetes mellitus without complication (HCC)   . ERECTILE DYSFUNCTION 04/25/2009  . GERD 03/24/2009  . HYPERLIPIDEMIA 03/28/2010  . HYPERTENSION 03/24/2009  . OBESITY, MODERATE 04/25/2009  . PONV (postoperative nausea and vomiting)     Past Surgical History:  Procedure Laterality Date  . COLONOSCOPY N/A 06/21/2015   Procedure: COLONOSCOPY;  Surgeon: Malissa Hippo, MD;  Location: [facility];  Service: Endoscopy;  Laterality: N/A;  930 - moved to 7/20 @ 7:30  . NASAL SEPTUM SURGERY  1974  . TONSILLECTOMY     3 yrs. old  . TOTAL KNEE ARTHROPLASTY  06/11/2012   Procedure: TOTAL KNEE ARTHROPLASTY;  Surgeon: Javier Docker, MD;  Location: [facility];  Service: Orthopedics;  Laterality: Right;    Family History  Problem Relation Age of Onset  . Scleroderma Mother   . Dementia Father     Social History   Socioeconomic History  . Marital status: Married    Spouse name: Cordelia Pen  . Number of children: 1  . Years of education: Not on file  . Highest education level: Not on file   Occupational History  . Occupation: retired  Tobacco Use  . Smoking status: Never Smoker  . Smokeless tobacco: Never Used  Vaping Use  . Vaping Use: Never used  Substance and Sexual Activity  . Alcohol use: No  . Drug use: No  . Sexual activity: Not on file  Other Topics Concern  . Not on file  Social History Narrative   Married to [facility] - 2   1 daughter lives next door   grandchildren   Social Determinants of Health   Financial Resource Strain: Low Risk   . Difficulty of Paying Living Expenses: Not hard at all  Food Insecurity: No Food Insecurity  . Worried About Programme researcher, broadcasting/film/video in the Last Year: Never true  . Ran Out of Food in the Last Year: Never true  Transportation Needs: No Transportation Needs  . Lack of Transportation (Medical): No  . Lack of Transportation (Non-Medical): No  Physical Activity: Insufficiently Active  . Days of Exercise per Week: 6 days  . Minutes of Exercise per Session: 20 min  Stress: No Stress Concern Present  . Feeling of Stress : Not at all  Social Connections: Socially Integrated  . Frequency of Communication with Friends and Family: More than three times a week  . Frequency of Social Gatherings with Friends and Family: More than three times a week  . Attends Religious  Services: More than 4 times per year  . Active Member of Clubs or Organizations: Yes  . Attends Banker Meetings: More than 4 times per year  . Marital Status: Married  Catering manager Violence: Not At Risk  . Fear of Current or Ex-Partner: No  . Emotionally Abused: No  . Physically Abused: No  . Sexually Abused: No    Outpatient Medications Prior to Visit  Medication Sig Dispense Refill  . ACCU-CHEK FASTCLIX LANCETS MISC Test once daily Dx E11.9 100 each 3  . aspirin EC 81 MG tablet Take 81 mg by mouth daily.    . fluticasone (FLONASE) 50 MCG/ACT nasal spray 1 SPRAY NASALLY ONCE A DAY 48 g 1  . glucose blood (ACCU-CHEK AVIVA) test strip Test  once daily. E11.9 100 each 12  . meloxicam (MOBIC) 15 MG tablet Take 15 mg by mouth daily.    . metFORMIN (GLUCOPHAGE) 500 MG tablet TAKE 2 TABLETS TWO TIMES DAILY WITH A MEAL 360 tablet 1  . Omega 3 1200 MG CAPS Take by mouth.    . valsartan-hydrochlorothiazide (DIOVAN-HCT) 160-12.5 MG tablet TAKE 1 TABLET AT BEDTIME 90 tablet 1  . vitamin C (ASCORBIC ACID) 500 MG tablet Take 500 mg by mouth daily.    . rosuvastatin (CRESTOR) 5 MG tablet Take 1 tablet (5 mg total) by mouth daily. (Patient not taking: No sig reported) 90 tablet 3  . sildenafil (VIAGRA) 100 MG tablet Take 100 mg by mouth daily as needed. For ED (Patient not taking: No sig reported)    . sucralfate (CARAFATE) 1 g tablet Take 1 tablet (1 g total) by mouth 4 (four) times daily - with mealsand at bedtime. (Patient not taking: No sig reported) 60 tablet 0   No facility-administered medications prior to visit.    Allergies  Allergen Reactions  . Lipitor [Atorvastatin Calcium] Other (See Comments)  . Penicillins Other (See Comments)    Reaction=bleeding,diarrhea  . Statins Other (See Comments)    Myalgia     ROS Review of Systems  Constitutional: Negative for fatigue and unexpected weight change.  Eyes: Negative for visual disturbance.  Respiratory: Negative for cough, chest tightness and shortness of breath.   Cardiovascular: Negative for chest pain, palpitations and leg swelling.  Endocrine: Negative for polydipsia and polyuria.  Neurological: Negative for dizziness, syncope, weakness, light-headedness and headaches.      Objective:    Physical Exam Constitutional:      Appearance: He is well-developed.  HENT:     Right Ear: External ear normal.     Left Ear: External ear normal.  Eyes:     Pupils: Pupils are equal, round, and reactive to light.  Neck:     Thyroid: No thyromegaly.  Cardiovascular:     Rate and Rhythm: Normal rate and regular rhythm.  Pulmonary:     Effort: Pulmonary effort is normal. No  respiratory distress.     Breath sounds: Normal breath sounds. No wheezing or rales.  Musculoskeletal:     Cervical back: Neck supple.  Skin:    Comments: Feet reveal no skin lesions. Good distal foot pulses. Good capillary refill. No calluses. Normal sensation with monofilament testing   Neurological:     Mental Status: He is alert and oriented to person, place, and time.     BP 128/76   Pulse 92   Temp 98 F (36.7 C) (Oral)   Ht 5\' 8"  (1.727 m)   Wt 223 lb 4 oz (101.3 kg)  SpO2 97%   BMI 33.95 kg/m  Wt Readings from Last 3 Encounters:  02/19/21 223 lb 4 oz (101.3 kg)  02/15/21 221 lb 3 oz (100.3 kg)  04/24/20 218 lb 4.8 oz (99 kg)     Health Maintenance Due  Topic Date Due  . COVID-19 Vaccine (1) Never done    There are no preventive care reminders to display for this patient.  Lab Results  Component Value Date   TSH 1.39 04/25/2020   Lab Results  Component Value Date   WBC 9.3 04/25/2020   HGB 14.0 04/25/2020   HCT 41.0 04/25/2020   MCV 87.0 04/25/2020   PLT 290.0 04/25/2020   Lab Results  Component Value Date   NA 139 04/25/2020   K 4.3 04/25/2020   CO2 32 04/25/2020   GLUCOSE 129 (H) 04/25/2020   BUN 21 04/25/2020   CREATININE 1.13 04/25/2020   BILITOT 0.5 04/25/2020   ALKPHOS 47 04/25/2020   AST 13 04/25/2020   ALT 14 04/25/2020   PROT 6.7 04/25/2020   ALBUMIN 4.7 04/25/2020   CALCIUM 10.1 04/25/2020   GFR 63.20 04/25/2020   Lab Results  Component Value Date   CHOL 160 07/23/2019   Lab Results  Component Value Date   HDL 41.00 07/23/2019   Lab Results  Component Value Date   LDLCALC 82 07/23/2019   Lab Results  Component Value Date   TRIG 184.0 (H) 07/23/2019   Lab Results  Component Value Date   CHOLHDL 4 07/23/2019   Lab Results  Component Value Date   HGBA1C 6.5 02/19/2021      Assessment & Plan:   #1 type 2 diabetes fairly well controlled with A1c 6.5% -Continue Metformin at current dosage -Continue low glycemic  diet and exercise regularly. -Continue yearly eye exam  #2 hypertension stable and at goal -Check basic metabolic panel -Continue Diovan HCTZ at current dosage  #3 dyslipidemia.  We discussed importance of statin use in diabetics but he states he has been intolerant of at least 3 statins previously.  He declines further statin use at this time.  No orders of the defined types were placed in this encounter.   Follow-up: Return in about 6 months (around 08/22/2021).    Evelena Peat, MD

## 2021-05-01 DIAGNOSIS — E119 Type 2 diabetes mellitus without complications: Secondary | ICD-10-CM | POA: Diagnosis not present

## 2021-05-01 DIAGNOSIS — H00031 Abscess of right upper eyelid: Secondary | ICD-10-CM | POA: Diagnosis not present

## 2021-05-01 DIAGNOSIS — H524 Presbyopia: Secondary | ICD-10-CM | POA: Diagnosis not present

## 2021-05-01 DIAGNOSIS — H52203 Unspecified astigmatism, bilateral: Secondary | ICD-10-CM | POA: Diagnosis not present

## 2021-05-01 LAB — HM DIABETES EYE EXAM

## 2021-05-03 ENCOUNTER — Encounter: Payer: Self-pay | Admitting: Family Medicine

## 2021-07-05 DIAGNOSIS — Z7982 Long term (current) use of aspirin: Secondary | ICD-10-CM | POA: Diagnosis not present

## 2021-07-05 DIAGNOSIS — Z880 Allergy status to penicillin: Secondary | ICD-10-CM | POA: Diagnosis not present

## 2021-07-05 DIAGNOSIS — E119 Type 2 diabetes mellitus without complications: Secondary | ICD-10-CM | POA: Diagnosis not present

## 2021-07-05 DIAGNOSIS — I10 Essential (primary) hypertension: Secondary | ICD-10-CM | POA: Diagnosis not present

## 2021-07-05 DIAGNOSIS — E669 Obesity, unspecified: Secondary | ICD-10-CM | POA: Diagnosis not present

## 2021-07-05 DIAGNOSIS — Z7984 Long term (current) use of oral hypoglycemic drugs: Secondary | ICD-10-CM | POA: Diagnosis not present

## 2021-07-05 DIAGNOSIS — Z6833 Body mass index (BMI) 33.0-33.9, adult: Secondary | ICD-10-CM | POA: Diagnosis not present

## 2021-08-16 ENCOUNTER — Other Ambulatory Visit: Payer: Self-pay | Admitting: Family Medicine

## 2021-08-16 DIAGNOSIS — I10 Essential (primary) hypertension: Secondary | ICD-10-CM

## 2021-11-16 DIAGNOSIS — U071 COVID-19: Secondary | ICD-10-CM | POA: Diagnosis not present

## 2022-01-15 ENCOUNTER — Telehealth: Payer: Self-pay | Admitting: Family Medicine

## 2022-01-15 NOTE — Telephone Encounter (Signed)
Left message for patient to call back and schedule Medicare Annual Wellness Visit (AWV) either virtually or in office. Left  my Zachery Conch number [phone]   Last AWV 02/15/21  please schedule at anytime with [facility] Nurse Health Advisor 1 or 2   This should be a 45 minute visit.   Awv can be schedule calendar year [place]

## 2022-01-25 ENCOUNTER — Ambulatory Visit (INDEPENDENT_AMBULATORY_CARE_PROVIDER_SITE_OTHER): Payer: Medicare PPO

## 2022-01-25 VITALS — BP 122/79 | Ht 68.0 in

## 2022-01-25 DIAGNOSIS — Z0000 Encounter for general adult medical examination without abnormal findings: Secondary | ICD-10-CM | POA: Diagnosis not present

## 2022-01-25 NOTE — Patient Instructions (Addendum)
Clifford Gibson , Thank you for taking time to come for your Medicare Wellness Visit. I appreciate your ongoing commitment to your health goals. Please review the following plan we discussed and let me know if I can assist you in the future.   These are the goals we discussed:  Goals       DIET - INCREASE WATER INTAKE (pt-stated)      Increase water intake to 6-8 glasses daily       Increase physical activity      Goal of physical activity 5 times a week and 30 minutes each episode      Weight (lb) < 190 lb (86.2 kg)        This is a list of the screening recommended for you and due dates:  Health Maintenance  Topic Date Due   COVID-19 Vaccine (1) 09/27/2020   Hemoglobin A1C  02/22/2022*   Complete foot exam   02/19/2022   Eye exam for diabetics  05/01/2022   Tetanus Vaccine  12/01/2028   Pneumonia Vaccine  Completed   Flu Shot  Completed   Hepatitis C Screening: USPSTF Recommendation to screen - Ages 18-79 yo.  Completed   Zoster (Shingles) Vaccine  Completed   HPV Vaccine  Aged Out   Colon Cancer Screening  Discontinued  *Topic was postponed. The date shown is not the original due date.    Advanced directives: Yes Patient will bring copy  Conditions/risks identified: None  Next appointment: Follow up in one year for your annual wellness visit.    Preventive Care 77 Years and Older, Male Preventive care refers to lifestyle choices and visits with your health care provider that can promote health and wellness. What does preventive care include? A yearly physical exam. This is also called an annual well check. Dental exams once or twice a year. Routine eye exams. Ask your health care provider how often you should have your eyes checked. Personal lifestyle choices, including: Daily care of your teeth and gums. Regular physical activity. Eating a healthy diet. Avoiding tobacco and drug use. Limiting alcohol use. Practicing safe sex. Taking low doses of aspirin every  day. Taking vitamin and mineral supplements as recommended by your health care provider. What happens during an annual well check? The services and screenings done by your health care provider during your annual well check will depend on your age, overall health, lifestyle risk factors, and family history of disease. Counseling  Your health care provider may ask you questions about your: Alcohol use. Tobacco use. Drug use. Emotional well-being. Home and relationship well-being. Sexual activity. Eating habits. History of falls. Memory and ability to understand (cognition). Work and work Astronomer. Screening  You may have the following tests or measurements: Height, weight, and BMI. Blood pressure. Lipid and cholesterol levels. These may be checked every 5 years, or more frequently if you are over 23 years old. Skin check. Lung cancer screening. You may have this screening every year starting at age 55 if you have a 30-pack-year history of smoking and currently smoke or have quit within the past 15 years. Fecal occult blood test (FOBT) of the stool. You may have this test every year starting at age 45. Flexible sigmoidoscopy or colonoscopy. You may have a sigmoidoscopy every 5 years or a colonoscopy every 10 years starting at age 33. Prostate cancer screening. Recommendations will vary depending on your family history and other risks. Hepatitis C blood test. Hepatitis B blood test. Sexually transmitted disease (STD)  testing. Diabetes screening. This is done by checking your blood sugar (glucose) after you have not eaten for a while (fasting). You may have this done every 1-3 years. Abdominal aortic aneurysm (AAA) screening. You may need this if you are a current or former smoker. Osteoporosis. You may be screened starting at age 51 if you are at high risk. Talk with your health care provider about your test results, treatment options, and if necessary, the need for more  tests. Vaccines  Your health care provider may recommend certain vaccines, such as: Influenza vaccine. This is recommended every year. Tetanus, diphtheria, and acellular pertussis (Tdap, Td) vaccine. You may need a Td booster every 10 years. Zoster vaccine. You may need this after age 34. Pneumococcal 13-valent conjugate (PCV13) vaccine. One dose is recommended after age 55. Pneumococcal polysaccharide (PPSV23) vaccine. One dose is recommended after age 70. Talk to your health care provider about which screenings and vaccines you need and how often you need them. This information is not intended to replace advice given to you by your health care provider. Make sure you discuss any questions you have with your health care provider. Document Released: 12/15/2015 Document Revised: 08/07/2016 Document Reviewed: 09/19/2015 Elsevier Interactive Patient Education  2017 [employer].  Fall Prevention in the Home Falls can cause injuries. They can happen to people of all ages. There are many things you can do to make your home safe and to help prevent falls. What can I do on the outside of my home? Regularly fix the edges of walkways and driveways and fix any cracks. Remove anything that might make you trip as you walk through a door, such as a raised step or threshold. Trim any bushes or trees on the path to your home. Use bright outdoor lighting. Clear any walking paths of anything that might make someone trip, such as rocks or tools. Regularly check to see if handrails are loose or broken. Make sure that both sides of any steps have handrails. Any raised decks and porches should have guardrails on the edges. Have any leaves, snow, or ice cleared regularly. Use sand or salt on walking paths during winter. Clean up any spills in your garage right away. This includes oil or grease spills. What can I do in the bathroom? Use night lights. Install grab bars by the toilet and in the tub and shower.  Do not use towel bars as grab bars. Use non-skid mats or decals in the tub or shower. If you need to sit down in the shower, use a plastic, non-slip stool. Keep the floor dry. Clean up any water that spills on the floor as soon as it happens. Remove soap buildup in the tub or shower regularly. Attach bath mats securely with double-sided non-slip rug tape. Do not have throw rugs and other things on the floor that can make you trip. What can I do in the bedroom? Use night lights. Make sure that you have a light by your bed that is easy to reach. Do not use any sheets or blankets that are too big for your bed. They should not hang down onto the floor. Have a firm chair that has side arms. You can use this for support while you get dressed. Do not have throw rugs and other things on the floor that can make you trip. What can I do in the kitchen? Clean up any spills right away. Avoid walking on wet floors. Keep items that you use a lot in  easy-to-reach places. If you need to reach something above you, use a strong step stool that has a grab bar. Keep electrical cords out of the way. Do not use floor polish or wax that makes floors slippery. If you must use wax, use non-skid floor wax. Do not have throw rugs and other things on the floor that can make you trip. What can I do with my stairs? Do not leave any items on the stairs. Make sure that there are handrails on both sides of the stairs and use them. Fix handrails that are broken or loose. Make sure that handrails are as long as the stairways. Check any carpeting to make sure that it is firmly attached to the stairs. Fix any carpet that is loose or worn. Avoid having throw rugs at the top or bottom of the stairs. If you do have throw rugs, attach them to the floor with carpet tape. Make sure that you have a light switch at the top of the stairs and the bottom of the stairs. If you do not have them, ask someone to add them for you. What else  can I do to help prevent falls? Wear shoes that: Do not have high heels. Have rubber bottoms. Are comfortable and fit you well. Are closed at the toe. Do not wear sandals. If you use a stepladder: Make sure that it is fully opened. Do not climb a closed stepladder. Make sure that both sides of the stepladder are locked into place. Ask someone to hold it for you, if possible. Clearly mark and make sure that you can see: Any grab bars or handrails. First and last steps. Where the edge of each step is. Use tools that help you move around (mobility aids) if they are needed. These include: Canes. Walkers. Scooters. Crutches. Turn on the lights when you go into a dark area. Replace any light bulbs as soon as they burn out. Set up your furniture so you have a clear path. Avoid moving your furniture around. If any of your floors are uneven, fix them. If there are any pets around you, be aware of where they are. Review your medicines with your doctor. Some medicines can make you feel dizzy. This can increase your chance of falling. Ask your doctor what other things that you can do to help prevent falls. This information is not intended to replace advice given to you by your health care provider. Make sure you discuss any questions you have with your health care provider. Document Released: 09/14/2009 Document Revised: 04/25/2016 Document Reviewed: 12/23/2014 Elsevier Interactive Patient Education  2017 [employer].

## 2022-01-25 NOTE — Progress Notes (Signed)
Subjective:   Clifford Gibson is a 76 y.o. male who presents for Medicare Annual/Subsequent preventive examination.  Review of Systems    Virtual Visit via Telephone Note  I connected with  Clifford Gibson on 01/25/22 at  2:30 PM EST by telephone and verified that I am speaking with the correct person using two identifiers.  Location: Patient: Home Provider: Office Persons participating in the virtual visit: patient/Nurse Health Advisor   I discussed the limitations, risks, security and privacy concerns of performing an evaluation and management service by telephone and the availability of in person appointments. The patient expressed understanding and agreed to proceed.  Interactive audio and video telecommunications were attempted between this nurse and patient, however failed, due to patient having technical difficulties OR patient did not have access to video capability.  We continued and completed visit with audio only.  Some vital signs may be absent or patient reported.   Tillie Rung, LPN  Cardiac Risk Factors include: advanced age (>37men, >8 women);hypertension;male gender;diabetes mellitus     Objective:    Today's Vitals   01/25/22 1434  BP: 122/79  Height: 5\' 8"  (1.727 m)   Body mass index is 33.95 kg/m.  Advanced Directives 01/25/2022 02/15/2021 11/30/2019 12/01/2018 03/10/2018 09/24/2016 11/02/2015  Does Patient Have a Medical Advance Directive? Yes Yes Yes Yes Yes Yes Yes  Type of 14/12/2014 of [place];Living will Healthcare Power of [place];Living will Healthcare Power of [place];Living will Living will - - -  Does patient want to make changes to medical advance directive? No - Patient declined No - Patient declined No - Patient declined - - - -  Copy of Healthcare Power of Attorney in Chart? No - copy requested No - copy requested No - copy requested - - - -  Would patient like information on creating a medical advance  directive? - - - - - - -  Pre-existing out of facility DNR order (yellow form or pink MOST form) - - - - - - -    Current Medications (verified) Outpatient Encounter Medications as of 01/25/2022  Medication Sig   ACCU-CHEK FASTCLIX LANCETS MISC Test once daily Dx E11.9   aspirin EC 81 MG tablet Take 81 mg by mouth daily.   fluticasone (FLONASE) 50 MCG/ACT nasal spray 1 SPRAY NASALLY ONCE A DAY   glucose blood (ACCU-CHEK AVIVA) test strip Test once daily. E11.9   meloxicam (MOBIC) 15 MG tablet Take 15 mg by mouth daily.   metFORMIN (GLUCOPHAGE) 500 MG tablet TAKE 2 TABLETS TWO TIMES DAILY WITH A MEAL   Omega 3 1200 MG CAPS Take by mouth.   rosuvastatin (CRESTOR) 5 MG tablet Take 1 tablet (5 mg total) by mouth daily. (Patient not taking: No sig reported)   valsartan-hydrochlorothiazide (DIOVAN-HCT) 160-12.5 MG tablet TAKE 1 TABLET AT BEDTIME   vitamin C (ASCORBIC ACID) 500 MG tablet Take 500 mg by mouth daily.   No facility-administered encounter medications on file as of 01/25/2022.    Allergies (verified) Lipitor [atorvastatin calcium], Penicillins, and Statins   History: Past Medical History:  Diagnosis Date   Acute serous otitis media 02/22/2010   Arthritis    Diabetes mellitus without complication (HCC)    ERECTILE DYSFUNCTION 04/25/2009   GERD 03/24/2009   HYPERLIPIDEMIA 03/28/2010   HYPERTENSION 03/24/2009   OBESITY, MODERATE 04/25/2009   PONV (postoperative nausea and vomiting)    Past Surgical History:  Procedure Laterality Date   COLONOSCOPY N/A 06/21/2015   Procedure:  COLONOSCOPY;  Surgeon: Malissa HippoNajeeb U Rehman, MD;  Location: [facility];  Service: Endoscopy;  Laterality: N/A;  930 - moved to 7/20 @ 7:30   NASAL SEPTUM SURGERY  1974   TONSILLECTOMY     3 yrs. old   TOTAL KNEE ARTHROPLASTY  06/11/2012   Procedure: TOTAL KNEE ARTHROPLASTY;  Surgeon: Javier DockerJeffrey C Beane, MD;  Location: [facility];  Service: Orthopedics;  Laterality: Right;   Family History  Problem Relation Age of  Onset   Scleroderma Mother    Dementia Father    Social History   Socioeconomic History   Marital status: Married    Spouse name: Clifford Gibson   Number of children: 1   Years of education: Not on file   Highest education level: Not on file  Occupational History   Occupation: retired  Tobacco Use   Smoking status: Never   Smokeless tobacco: Never  Vaping Use   Vaping Use: Never used  Substance and Sexual Activity   Alcohol use: No   Drug use: No   Sexual activity: Not on file  Other Topics Concern   Not on file  Social History Narrative   Married to [place]   HH - 2   1 daughter lives next door   grandchildren   Social Determinants of Health   Financial Resource Strain: Low Risk    Difficulty of Paying Living Expenses: Not hard at all  Food Insecurity: No Food Insecurity   Worried About Programme researcher, broadcasting/film/videounning Out of Food in the Last Year: Never true   Baristaan Out of Food in the Last Year: Never true  Transportation Needs: No Transportation Needs   Lack of Transportation (Medical): No   Lack of Transportation (Non-Medical): No  Physical Activity: Sufficiently Active   Days of Exercise per Week: 4 days   Minutes of Exercise per Session: 40 min  Stress: No Stress Concern Present   Feeling of Stress : Not at all  Social Connections: Socially Integrated   Frequency of Communication with Friends and Family: More than three times a week   Frequency of Social Gatherings with Friends and Family: More than three times a week   Attends Religious Services: More than 4 times per year   Active Member of [employer]ubs or Organizations: Yes   Attends Engineer, structuralClub or Organization Meetings: More than 4 times per year   Marital Status: Married     Clinical Intake:  Diabetic?  Yes  Interpreter Needed?: NoNutrition Risk Assessment:  Has the patient had any N/V/D within the last 2 months?  No  Does the patient have any non-healing wounds?  No  Has the patient had any unintentional weight loss or weight gain?  No    Diabetes:  Is the patient diabetic?  Yes  If diabetic, was a CBG obtained today?  No  Did the patient bring in their glucometer from home?  No Audio Visit How often do you monitor your CBG's? Daily.   Financial Strains and Diabetes Management:  Are you having any financial strains with the device, your supplies or your medication? No .  Does the patient want to be seen by Chronic Care Management for management of their diabetes?  No  Would the patient like to be referred to a Nutritionist or for Diabetic Management?  No   Diabetic Exams:  Diabetic Eye Exam: Completed Yes. Overdue for diabetic eye exam. Pt has been advised about the importance in completing this exam. A referral has been placed today. Message sent to referral  coordinator for scheduling purposes. Advised pt to expect a call from office referred to regarding appt.  Diabetic Foot Exam: Completed Yes. Pt has been advised about the importance in completing this exam. Pt is scheduled for diabetic foot exam on Followed by PCP.    Information entered by :: Theresa Mulligan LPN Activities of Daily Living In your present state of health, do you have any difficulty performing the following activities: 01/25/2022 02/15/2021  Hearing? N Y  Comment - has some decreased hearing  Vision? N N  Difficulty concentrating or making decisions? N N  Walking or climbing stairs? N N  Dressing or bathing? N N  Doing errands, shopping? N N  Preparing Food and eating ? N N  Using the Toilet? N N  In the past six months, have you accidently leaked urine? N N  Do you have problems with loss of bowel control? N N  Managing your Medications? N N  Managing your Finances? N N  Housekeeping or managing your Housekeeping? N N  Some recent data might be hidden    Patient Care Team: Kristian Covey, MD as PCP - General  Indicate any recent Medical Services you may have received from other than [facility] providers in the past year (date may be  approximate).     Assessment:   This is a routine wellness examination for Clifford Gibson.  Hearing/Vision screen Hearing Screening - Comments:: No hearing difficulty Vision Screening - Comments:: Wears glasses. Follow[facility]  Dietary issues and exercise activities discussed: Exercise limited by: None identified   Goals Addressed               This Visit's Progress     DIET - INCREASE WATER INTAKE (pt-stated)        Increase water intake to 6-8 glasses daily        Depression Screen PHQ 2/9 Scores 01/25/2022 02/15/2021 11/30/2019 07/23/2019 03/10/2018 10/28/2017 09/24/2016  PHQ - 2 Score 0 0 0 0 0 0 0  PHQ- 9 Score - - - 0 - - -    Fall Risk Fall Risk  01/25/2022 02/15/2021 11/30/2019 07/23/2019 03/10/2018  Falls in the past year? 0 0 0 0 No  Number falls in past yr: 0 0 - - -  Injury with Fall? 0 0 - - -  Risk for fall due to : No Fall Risks No Fall Risks Medication side effect - -  Follow up - Falls evaluation completed;Falls prevention discussed Falls evaluation completed;Education provided;Falls prevention discussed - -    FALL RISK PREVENTION PERTAINING TO THE HOME:  Any stairs in or around the home? Yes  If so, are there any without handrails? No  Home free of loose throw rugs in walkways, pet beds, electrical cords, etc? Yes  Adequate lighting in your home to reduce risk of falls? Yes   ASSISTIVE DEVICES UTILIZED TO PREVENT FALLS:  Life alert? No  Use of a cane, walker or w/c? No  Grab bars in the bathroom? Yes  Shower chair or bench in shower? Yes  Elevated toilet seat or a handicapped toilet? Yes  TIMED UP AND GO:  Was the test performed? No . Audio Visit  Cognitive Function: MMSE - Mini Mental State Exam 03/10/2018 09/24/2016  Not completed: (No Data) (No Data)     6CIT Screen 01/25/2022 11/30/2019  What Year? 0 points 0 points  What month? 0 points 0 points  What time? 0 points 0 points  Count back from  20 0 points 0 points  Months in  reverse 0 points 0 points  Repeat phrase 0 points 0 points  Total Score 0 0    Immunizations Immunization History  Administered Date(s) Administered   Fluad Quad(high Dose 65+) 09/18/2020   Influenza Whole 10/03/2011   Influenza, High Dose Seasonal PF 09/24/2016   Influenza,inj,Quad PF,6+ Mos 09/11/2018, 08/26/2019   Influenza-Unspecified 10/14/2015, 09/12/2017, 09/14/2018   MODERNA COVID-19 SARS-COV-2 PEDS BIVALENT BOOSTER 6Y-11Y 09/27/2020   Pneumococcal Conjugate-13 02/03/2014   Pneumococcal Polysaccharide-23 03/28/2010, 09/14/2018   Td 01/07/2008   Tdap 12/01/2018   Zoster Recombinat (Shingrix) 09/14/2018, 11/17/2018    TDAP status: Up to date  Flu Vaccine status: Up to date  Pneumococcal vaccine status: Up to date  Covid-19 vaccine status: Completed vaccines  Qualifies for Shingles Vaccine? Yes   Zostavax completed Yes   Shingrix Completed?: Yes  Screening Tests Health Maintenance  Topic Date Due   COVID-19 Vaccine (1) 09/27/2020   HEMOGLOBIN A1C  02/22/2022 (Originally 08/22/2021)   FOOT EXAM  02/19/2022   OPHTHALMOLOGY EXAM  05/01/2022   TETANUS/TDAP  12/01/2028   Pneumonia Vaccine 25+ Years old  Completed   INFLUENZA VACCINE  Completed   Hepatitis C Screening  Completed   Zoster Vaccines- Shingrix  Completed   HPV VACCINES  Aged Out   COLONOSCOPY (Pts 45-23yrs Insurance coverage will need to be confirmed)  Discontinued    Health Maintenance  Health Maintenance Due  Topic Date Due   COVID-19 Vaccine (1) 09/27/2020    Colorectal cancer screening: No longer required.   Lung Cancer Screening: (Low Dose CT Chest recommended if Age 5-80 years, 30 pack-year currently smoking OR have quit w/in 15years.) does not qualify.     Additional Screening:  Hepatitis C Screening: does qualify; Completed 12/28/16  Vision Screening: Recommended annual ophthalmology exams for early detection of glaucoma and other disorders of the eye. Is the patient up to date  with their annual eye exam?  Yes  Who is the provider or what is the name of the office in which the patient attends annual eye exams? [facility] If pt is not established with a provider, would they like to be referred to a provider to establish care? No .   Dental Screening: Recommended annual dental exams for proper oral hygiene  Community Resource Referral / Chronic Care Management:  CRR required this visit?  No   CCM required this visit?  No      Plan:     I have personally reviewed and noted the following in the patients chart:   Medical and social history Use of alcohol, tobacco or illicit drugs  Current medications and supplements including opioid prescriptions. Patient is not currently taking opioid prescriptions. Functional ability and status Nutritional status Physical activity Advanced directives List of other physicians Hospitalizations, surgeries, and ER visits in previous 12 months Vitals Screenings to include cognitive, depression, and falls Referrals and appointments  In addition, I have reviewed and discussed with patient certain preventive protocols, quality metrics, and best practice recommendations. A written personalized care plan for preventive services as well as general preventive health recommendations were provided to patient.     Tillie Rung, LPN   1/75/1025   Nurse Notes: None

## 2022-03-08 ENCOUNTER — Other Ambulatory Visit: Payer: Self-pay | Admitting: Family Medicine

## 2022-03-08 DIAGNOSIS — I10 Essential (primary) hypertension: Secondary | ICD-10-CM

## 2022-03-29 ENCOUNTER — Other Ambulatory Visit: Payer: Self-pay | Admitting: Family Medicine

## 2022-03-29 DIAGNOSIS — E119 Type 2 diabetes mellitus without complications: Secondary | ICD-10-CM

## 2022-04-16 ENCOUNTER — Ambulatory Visit: Payer: Medicare PPO | Admitting: Family Medicine

## 2022-04-16 ENCOUNTER — Ambulatory Visit (INDEPENDENT_AMBULATORY_CARE_PROVIDER_SITE_OTHER): Payer: Medicare PPO

## 2022-04-16 VITALS — BP 138/80 | HR 86 | Temp 98.0°F | Ht 68.0 in | Wt 214.8 lb

## 2022-04-16 DIAGNOSIS — M25552 Pain in left hip: Secondary | ICD-10-CM

## 2022-04-16 DIAGNOSIS — E785 Hyperlipidemia, unspecified: Secondary | ICD-10-CM

## 2022-04-16 DIAGNOSIS — R413 Other amnesia: Secondary | ICD-10-CM | POA: Diagnosis not present

## 2022-04-16 DIAGNOSIS — E119 Type 2 diabetes mellitus without complications: Secondary | ICD-10-CM | POA: Diagnosis not present

## 2022-04-16 DIAGNOSIS — R600 Localized edema: Secondary | ICD-10-CM | POA: Diagnosis not present

## 2022-04-16 DIAGNOSIS — I10 Essential (primary) hypertension: Secondary | ICD-10-CM

## 2022-04-16 NOTE — Progress Notes (Signed)
? ?Established Patient Office Visit ? ?Subjective   ?Patient ID: Clifford Gibson, male    DOB: 1946/02/13  Age: 76 y.o. MRN: [id number] ? ?Chief Complaint  ?Patient presents with  ? Joint Swelling  ?  Patient complains of hip and ankle swelling, x4 days   ? ? ?HPI ? ? ?Clifford Gibson is here accompanied by wife with several things to discuss.  He was initially scheduled to discuss left hip pain but also has some bilateral lower extremity edema and concerns for possible memory loss. ? ?Regarding the hip pain no recent fall.  This been going on apparently for months.  Location is mostly lateral left hip.  Some with ambulation and occasionally at rest.  No anterior or medial hip pain.  Denies any low back pain currently. ? ?Does have some bilateral leg edema and if anything right lower extremity may be slightly more than left.  Does not seem to go down much at night.  No recent change of medications.  No orthopnea.  No dyspnea with basic exertion. ? ?He has history of hyperlipidemia and has had intolerance to multiple statins and is currently not taking any.  Needs follow-up labs.  Does have type 2 diabetes.  This has been controlled in the past but no recent labs. ? ?Family concerned about possible memory lapse.  They have noted that he seems to be repeating himself.  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  His mother apparently had history of dementia.  He denies any recent headaches.  No focal neurologic symptoms. ? ?Past Medical History:  ?Diagnosis Date  ? Acute serous otitis media 02/22/2010  ? Arthritis   ? Diabetes mellitus without complication (HCC)   ? ERECTILE DYSFUNCTION 04/25/2009  ? GERD 03/24/2009  ? HYPERLIPIDEMIA 03/28/2010  ? HYPERTENSION 03/24/2009  ? OBESITY, MODERATE 04/25/2009  ? PONV (postoperative nausea and vomiting)   ? ?Past Surgical History:  ?Procedure Laterality Date  ? COLONOSCOPY N/A 06/21/2015  ? Procedure: COLONOSCOPY;  Surgeon: Malissa Hippo, MD;  [facility];  Service: Endoscopy;  Laterality: N/A;  930 - moved to 7/20 @ 7:30  ? NASAL SEPTUM SURGERY  1974  ? TONSILLECTOMY    ? 3 yrs. old  ? TOTAL KNEE ARTHROPLASTY  06/11/2012  ? Procedure: TOTAL KNEE ARTHROPLASTY;  Surgeon: Javier Docker, MD;  Location: [facility];  Service: Orthopedics;  Laterality: Right;  ? ? reports that he has never smoked. He has never used smokeless tobacco. He reports that he does not drink alcohol and does not use drugs. ?family history includes Dementia in his father; Scleroderma in his mother. ?Allergies  ?Allergen Reactions  ? Lipitor [Atorvastatin Calcium] Other (See Comments)  ? Penicillins Other (See Comments)  ?  Reaction=bleeding,diarrhea  ? Statins Other (See Comments)  ?  Myalgia ?  ? ? ?Review of Systems  ?Constitutional:  Negative for chills, fever and malaise/fatigue.  ?Respiratory:  Negative for cough and shortness of breath.   ?Cardiovascular:  Positive for leg swelling. Negative for chest pain.  ?Gastrointestinal:  Negative for abdominal pain.  ?Genitourinary:  Negative for dysuria.  ?Musculoskeletal:  Positive for joint pain.  ?Neurological:  Negative for speech change, focal weakness, seizures and headaches.  ?Psychiatric/Behavioral:  Positive for memory loss. Negative for depression and hallucinations.   ? ?  ?Objective:  ?  ? ?BP 138/80 (BP Location: Left Arm, Patient Position: Sitting, Cuff Size: Large)   Pulse 86   Temp  98 ?F (36.7 ?C) (Oral)   Ht 5\' 8"  (1.727 m)   Wt 214 lb 12.8 oz (97.4 kg)   SpO2 97%   BMI 32.66 kg/m?  ?Wt Readings from Last 3 Encounters:  ?04/16/22 214 lb 12.8 oz (97.4 kg)  ?02/19/21 223 lb 4 oz (101.3 kg)  ?02/15/21 221 lb 3 oz (100.3 kg)  ? ?  ? ?Physical Exam ?Vitals reviewed.  ?Cardiovascular:  ?   Rate and Rhythm: Normal rate and regular rhythm.  ?Pulmonary:  ?   Effort: Pulmonary effort is normal.  ?   Breath sounds: Normal breath sounds.  ?Musculoskeletal:  ?   Comments: Trace edema lower legs and ankles bilaterally. ?Good range of  motion left hip.  Does have some mild tenderness over the left greater trochanteric bursa.  ?Neurological:  ?   Mental Status: He is alert.  ?   Comments: Full strength with dorsiflexion and plantarflexion bilaterally.  Mild weakness with left hip flexion compared with right. ?Deep tendon reflexes are trace knee and ankle bilaterally.  ? ? ? ?No results found for any visits on 04/16/22. ? ?Last CBC ?Lab Results  ?Component Value Date  ? WBC 9.3 04/25/2020  ? HGB 14.0 04/25/2020  ? HCT 41.0 04/25/2020  ? MCV 87.0 04/25/2020  ? MCH 28.5 06/14/2012  ? RDW 14.9 04/25/2020  ? PLT 290.0 04/25/2020  ? ?Last metabolic panel ?Lab Results  ?Component Value Date  ? GLUCOSE 135 (H) 02/19/2021  ? NA 141 02/19/2021  ? K 4.2 02/19/2021  ? CL 101 02/19/2021  ? CO2 30 02/19/2021  ? BUN 26 (H) 02/19/2021  ? CREATININE 1.16 02/19/2021  ? GFRNONAA 84 (L) 06/12/2012  ? CALCIUM 9.9 02/19/2021  ? PROT 6.7 04/25/2020  ? ALBUMIN 4.7 04/25/2020  ? BILITOT 0.5 04/25/2020  ? ALKPHOS 47 04/25/2020  ? AST 13 04/25/2020  ? ALT 14 04/25/2020  ? ?Last lipids ?Lab Results  ?Component Value Date  ? CHOL 160 07/23/2019  ? HDL 41.00 07/23/2019  ? LDLCALC 82 07/23/2019  ? LDLDIRECT 148.0 01/27/2015  ? TRIG 184.0 (H) 07/23/2019  ? CHOLHDL 4 07/23/2019  ? ?Last hemoglobin A1c ?Lab Results  ?Component Value Date  ? HGBA1C 6.5 02/19/2021  ? ?Last thyroid functions ?Lab Results  ?Component Value Date  ? TSH 1.39 04/25/2020  ? ?  ? ?The 10-year ASCVD risk score (Arnett DK, et al., 2019) is: 57% ? ?  ?Assessment & Plan:  ? ?Problem List Items Addressed This Visit   ? ?  ? Unprioritized  ? Essential hypertension - Primary  ? Relevant Orders  ? Basic metabolic panel  ? Type 2 diabetes mellitus, controlled (HCC)  ? Relevant Orders  ? Hemoglobin A1c  ? Hyperlipemia  ? Relevant Orders  ? Lipid panel  ? Hepatic function panel  ? ?Other Visit Diagnoses   ? ? Bilateral leg edema      ? Relevant Orders  ? TSH  ? Microalbumin/Creatinine Ratio, Urine  ? Memory loss       ? Relevant Orders  ? Vitamin B12  ? Left hip pain      ? Relevant Orders  ? DG Hip Unilat W OR W/O Pelvis 2-3 Views Left  ? ?  ?Multiple issues as above addressed today.  Left lateral hip pain may be mostly bursitis.  Does have some tenderness over the bursa region.  Excellent range of motion.  Given duration of symptoms will obtain left hip films to further assess ? ?-Discussed  briefly alternatives possibly to statin use including Zetia and PCSK9 inhibitors.  At this point we will check labs first ? ?With question of memory loss check B12 and repeat TSH.  Schedule 30-minute follow-up in we will get some basic cognitive screening at that time.  Consider more formal neurocognitive assessment. ? ?Regarding his bilateral leg edema this seems to be relatively mild.  We will check labs as above.  Would avoid loop diuretic unless this progresses in severity. ? ?No follow-ups on file.  ? ? ?Evelena Peat, MD ? ?

## 2022-04-16 NOTE — Patient Instructions (Signed)
Set up 30 minute follow up soon.  ?

## 2022-04-17 LAB — HEPATIC FUNCTION PANEL
ALT: 15 U/L (ref 0–53)
AST: 13 U/L (ref 0–37)
Albumin: 4.9 g/dL (ref 3.5–5.2)
Alkaline Phosphatase: 49 U/L (ref 39–117)
Bilirubin, Direct: 0.1 mg/dL (ref 0.0–0.3)
Total Bilirubin: 0.6 mg/dL (ref 0.2–1.2)
Total Protein: 7.1 g/dL (ref 6.0–8.3)

## 2022-04-17 LAB — BASIC METABOLIC PANEL
BUN: 28 mg/dL — ABNORMAL HIGH (ref 6–23)
CO2: 28 mEq/L (ref 19–32)
Calcium: 10 mg/dL (ref 8.4–10.5)
Chloride: 100 mEq/L (ref 96–112)
Creatinine, Ser: 1.41 mg/dL (ref 0.40–1.50)
GFR: 48.13 mL/min — ABNORMAL LOW (ref >60.00)
Glucose, Bld: 88 mg/dL (ref 70–99)
Potassium: 4.4 mEq/L (ref 3.5–5.1)
Sodium: 139 mEq/L (ref 135–145)

## 2022-04-17 LAB — HEMOGLOBIN A1C: Hgb A1c MFr Bld: 6.3 % (ref 4.6–6.5)

## 2022-04-17 LAB — TSH: TSH: 1.24 u[IU]/mL (ref 0.35–5.50)

## 2022-04-17 LAB — LIPID PANEL
Cholesterol: 256 mg/dL — ABNORMAL HIGH (ref 0–200)
HDL: 50.3 mg/dL (ref >39.00)
NonHDL: 205.21
Total CHOL/HDL Ratio: 5
Triglycerides: 203 mg/dL — ABNORMAL HIGH (ref 0.0–149.0)
VLDL: 40.6 mg/dL — ABNORMAL HIGH (ref 0.0–40.0)

## 2022-04-17 LAB — LDL CHOLESTEROL, DIRECT: Direct LDL: 172 mg/dL

## 2022-04-17 LAB — VITAMIN B12: Vitamin B-12: 351 pg/mL (ref 211–911)

## 2022-04-19 NOTE — Progress Notes (Signed)
Noted  Luisdavid Hamblin W Gareld Obrecht MD [facility]  

## 2022-04-22 ENCOUNTER — Encounter: Payer: Self-pay | Admitting: Family Medicine

## 2022-04-22 ENCOUNTER — Ambulatory Visit: Payer: Medicare PPO | Admitting: Family Medicine

## 2022-04-22 VITALS — BP 134/80 | HR 91 | Temp 98.3°F | Ht 68.0 in | Wt 220.0 lb

## 2022-04-22 DIAGNOSIS — R4189 Other symptoms and signs involving cognitive functions and awareness: Secondary | ICD-10-CM

## 2022-04-22 DIAGNOSIS — E785 Hyperlipidemia, unspecified: Secondary | ICD-10-CM | POA: Diagnosis not present

## 2022-04-22 DIAGNOSIS — R600 Localized edema: Secondary | ICD-10-CM

## 2022-04-22 MED ORDER — DONEPEZIL HCL 5 MG PO TABS: 5.0000 mg | ORAL_TABLET | Freq: Every day | ORAL | 0 refills | Status: DC

## 2022-04-22 NOTE — Progress Notes (Signed)
Established Patient Office Visit  Subjective   Patient ID: Clifford Gibson, male    DOB: 06/11/1946  Age: 76 y.o. MRN: [id number]  Chief Complaint  Patient presents with   Follow-up    HPI  Past Medical History:  Diagnosis Date   Acute serous otitis media 02/22/2010   Arthritis    Diabetes mellitus without complication (HCC)    ERECTILE DYSFUNCTION 04/25/2009   GERD 03/24/2009   HYPERLIPIDEMIA 03/28/2010   HYPERTENSION 03/24/2009   OBESITY, MODERATE 04/25/2009   PONV (postoperative nausea and vomiting)    Family History  Problem Relation Age of Onset   Scleroderma Mother    Dementia Father      Accompanied today by wife to follow-up from visit last week.  Refer to that note for details.  Was seen then with multiple issues including some concern for cognitive impairment, hip pain, bilateral leg edema, hyperlipidemia.  Their main concern then was cognitive impairment that several family members had noted.  We obtained multiple labs.  B12 and thyroid were normal.  Cholesterol is very high.  Regarding his lipids he is currently not taking Crestor.  He states that with even low-dose Crestor he had severe myalgias and joint pains.  He had similar symptoms with Lipitor and Zocor.  He basically refuses any further statins.  He is 10-year risk of CAD is extremely high.  We did mention possible PCSK9 inhibitor.  He is not opposed to considering this if he can get coverage.  Regarding his cognitive issues wife give a couple of additional examples today.  She states that more than 1 occasion he is going to make coffee without there being any coffee in the machine.  When they were camping recently he had difficulties with set up and pare down which is not typical for him.  His mother had dementia.  Presumably Alzheimer's type.  This was late onset.  Past Medical History:  Diagnosis Date   Acute serous otitis media 02/22/2010   Arthritis    Diabetes mellitus without complication (HCC)     ERECTILE DYSFUNCTION 04/25/2009   GERD 03/24/2009   HYPERLIPIDEMIA 03/28/2010   HYPERTENSION 03/24/2009   OBESITY, MODERATE 04/25/2009   PONV (postoperative nausea and vomiting)    Past Surgical History:  Procedure Laterality Date   COLONOSCOPY N/A 06/21/2015   Procedure: COLONOSCOPY;  Surgeon: Malissa Hippo, MD;  [facility];  Service: Endoscopy;  Laterality: N/A;  930 - moved to 7/20 @ 7:30   NASAL SEPTUM SURGERY  1974   TONSILLECTOMY     3 yrs. old   TOTAL KNEE ARTHROPLASTY  06/11/2012   Procedure: TOTAL KNEE ARTHROPLASTY;  Surgeon: Javier Docker, MD;  Location: [facility];  Service: Orthopedics;  Laterality: Right;    reports that he has never smoked. He has never used smokeless tobacco. He reports that he does not drink alcohol and does not use drugs. family history includes Dementia in his father; Scleroderma in his mother. Allergies  Allergen Reactions   Lipitor [Atorvastatin Calcium] Other (See Comments)   Penicillins Other (See Comments)    Reaction=bleeding,diarrhea   Statins Other (See Comments)    Myalgia     Review of Systems  Constitutional:  Negative for weight loss.  Respiratory:  Negative for cough and shortness of breath.   Cardiovascular:  Negative for chest pain.  Gastrointestinal:  Negative for abdominal pain.  Neurological:  Negative for focal weakness and headaches.  Psychiatric/Behavioral:  Positive for memory loss. Negative  for depression.      Objective:     BP 134/80 (BP Location: Left Arm, Patient Position: Sitting, Cuff Size: Normal)   Pulse 91   Temp 98.3 F (36.8 C) (Oral)   Ht 5\' 8"  (1.727 m)   Wt 220 lb (99.8 kg)   SpO2 97%   BMI 33.45 kg/m  Wt Readings from Last 3 Encounters:  04/22/22 220 lb (99.8 kg)  04/16/22 214 lb 12.8 oz (97.4 kg)  02/19/21 223 lb 4 oz (101.3 kg)      Physical Exam Vitals reviewed.  Constitutional:      Appearance: Normal appearance.  Cardiovascular:     Rate and Rhythm: Normal rate and regular  rhythm.  Pulmonary:     Effort: Pulmonary effort is normal.     Breath sounds: Normal breath sounds.  Neurological:     General: No focal deficit present.     Mental Status: He is alert and oriented to person, place, and time.     Cranial Nerves: No cranial nerve deficit.     Motor: No weakness.  Psychiatric:     Comments: MMSE 25/30     No results found for any visits on 04/22/22.  Last CBC Lab Results  Component Value Date   WBC 9.3 04/25/2020   HGB 14.0 04/25/2020   HCT 41.0 04/25/2020   MCV 87.0 04/25/2020   MCH 28.5 06/14/2012   RDW 14.9 04/25/2020   PLT 290.0 04/25/2020   Last metabolic panel Lab Results  Component Value Date   GLUCOSE 88 04/16/2022   NA 139 04/16/2022   K 4.4 04/16/2022   CL 100 04/16/2022   CO2 28 04/16/2022   BUN 28 (H) 04/16/2022   CREATININE 1.41 04/16/2022   GFRNONAA 84 (L) 06/12/2012   CALCIUM 10.0 04/16/2022   PROT 7.1 04/16/2022   ALBUMIN 4.9 04/16/2022   BILITOT 0.6 04/16/2022   ALKPHOS 49 04/16/2022   AST 13 04/16/2022   ALT 15 04/16/2022   Last lipids Lab Results  Component Value Date   CHOL 256 (H) 04/16/2022   HDL 50.30 04/16/2022   LDLCALC 82 07/23/2019   LDLDIRECT 172.0 04/16/2022   TRIG 203.0 (H) 04/16/2022   CHOLHDL 5 04/16/2022   Last hemoglobin A1c Lab Results  Component Value Date   HGBA1C 6.3 04/16/2022   Last thyroid functions Lab Results  Component Value Date   TSH 1.24 04/16/2022   Last vitamin B12 and Folate Lab Results  Component Value Date   VITAMINB12 351 04/16/2022      The 10-year ASCVD risk score (Arnett DK, et al., 2019) is: 57.6%    Assessment & Plan:   #1 cognitive impairment.  Concern for possible early dementia.  Positive family history.  Family has noticed some definite changes in cognitive performance over the past year or so.  Recent TSH and B12 normal.  25/30 on MMSE. -Set up MRI brain to further assess -We discussed setting up consultation with neuropsychologist for more  in-depth evaluation -Consider trial of low-dose Aricept 5 mg daily and reassess 1 month and plan to titrate up to 10 mg then if tolerating well  #2 hyperlipidemia.  Intolerance with multiple statins including Lipitor, Crestor, Zocor.  We did discuss possibility of PCSK9 medication.  They are willing to consider.  We will set up consultation if possible with our pharmacist to discuss further     Return in about 1 month (around 05/23/2022).    05/25/2022, MD

## 2022-04-23 ENCOUNTER — Encounter: Payer: Self-pay | Admitting: Family Medicine

## 2022-04-23 ENCOUNTER — Encounter: Payer: Self-pay | Admitting: Psychology

## 2022-04-23 ENCOUNTER — Telehealth: Payer: Self-pay | Admitting: Family Medicine

## 2022-04-23 DIAGNOSIS — R531 Weakness: Secondary | ICD-10-CM

## 2022-04-23 LAB — MICROALBUMIN / CREATININE URINE RATIO
Creatinine,U: 79 mg/dL
Microalb Creat Ratio: 1.3 mg/g (ref 0.0–30.0)
Microalb, Ur: 1 mg/dL (ref 0.0–1.9)

## 2022-04-23 NOTE — Chronic Care Management (AMB) (Signed)
  Care Management  Note   04/23/2022 Name: Clifford Gibson MRN: [id number] DOB: [birth year]  Clifford Gibson is a 76 y.o. year old male who is a primary care patient of Burchette, Alinda Sierras, MD. The care management team was consulted for assistance with chronic disease management and care coordination needs.   Clifford Gibson was given information about Care Management services today including:  CCM service includes personalized support from designated clinical staff supervised by the physician, including individualized plan of care and coordination with other care providers 24/7 contact phone numbers for assistance for urgent and routine care needs. Service will only be billed when office clinical staff spend 20 minutes or more in a month to coordinate care. Only one practitioner may furnish and bill the service in a calendar month. The patient may stop CCM services at amy time (effective at the end of the month) by phone call to the office staff. The patient will be responsible for cost sharing (co-pay) or up to 20% of the service fee (after annual deductible is met)  Patient agreed to services and verbal consent obtained.  Follow up plan:   Face to Face appointment with care management team member scheduled for: 05/08/2022  Noelle Penner Upstream Scheduler

## 2022-05-02 DIAGNOSIS — H02831 Dermatochalasis of right upper eyelid: Secondary | ICD-10-CM | POA: Diagnosis not present

## 2022-05-02 DIAGNOSIS — E119 Type 2 diabetes mellitus without complications: Secondary | ICD-10-CM | POA: Diagnosis not present

## 2022-05-02 DIAGNOSIS — H31003 Unspecified chorioretinal scars, bilateral: Secondary | ICD-10-CM | POA: Diagnosis not present

## 2022-05-02 DIAGNOSIS — H5203 Hypermetropia, bilateral: Secondary | ICD-10-CM | POA: Diagnosis not present

## 2022-05-02 LAB — HM DIABETES EYE EXAM

## 2022-05-06 ENCOUNTER — Ambulatory Visit: Payer: Medicare PPO | Attending: Family Medicine

## 2022-05-06 DIAGNOSIS — M6281 Muscle weakness (generalized): Secondary | ICD-10-CM | POA: Insufficient documentation

## 2022-05-06 DIAGNOSIS — R2681 Unsteadiness on feet: Secondary | ICD-10-CM | POA: Diagnosis not present

## 2022-05-06 DIAGNOSIS — R531 Weakness: Secondary | ICD-10-CM | POA: Insufficient documentation

## 2022-05-07 NOTE — Therapy (Signed)
[facility] [address] [place], [place], [address] Phone: [phone]   Fax:  [phone]  Physical Therapy Evaluation  Patient Details  Name: Clifford Gibson MRN: [id number] Date of Birth: [birth year] Referring Provider (PT): Caryl NeverBurchette, MD   Encounter Date: 05/06/2022   PT End of Session - 05/06/22 1033     Visit Number 1    Number of Visits 12    Date for PT Re-Evaluation 07/26/22    PT Start Time 1036    PT Stop Time 1114    PT Time Calculation (min) 38 min    Activity Tolerance Patient tolerated treatment well    Behavior [facility] for tasks assessed/performed             Past Medical History:  Diagnosis Date   Acute serous otitis media 02/22/2010   Arthritis    Diabetes mellitus without complication (HCC)    ERECTILE DYSFUNCTION 04/25/2009   GERD 03/24/2009   HYPERLIPIDEMIA 03/28/2010   HYPERTENSION 03/24/2009   OBESITY, MODERATE 04/25/2009   PONV (postoperative nausea and vomiting)     Past Surgical History:  Procedure Laterality Date   COLONOSCOPY N/A 06/21/2015   Procedure: COLONOSCOPY;  Surgeon: Malissa HippoNajeeb U Rehman, MD;  [facility];  Service: Endoscopy;  Laterality: N/A;  930 - moved to 7/20 @ 7:30   NASAL SEPTUM SURGERY  1974   TONSILLECTOMY     3 yrs. old   TOTAL KNEE ARTHROPLASTY  06/11/2012   Procedure: TOTAL KNEE ARTHROPLASTY;  Surgeon: Javier DockerJeffrey C Beane, MD;  Location: [facility];  Service: Orthopedics;  Laterality: Right;    There were no vitals filed for this visit.    Subjective Assessment - 05/06/22 1034     Subjective Patient reports that his legs have been hurting since January 2023 with no known cause. He notes that his pain has been fairly stable since it first began. He notes that he has never had anything like this happen before. He notes that he was pulling weeds in his yard one day and he got down on the ground, but he was not able to get up without help. He feels that he could  have gotten up if his ground has been level, but it was on an incline.    Pertinent History R TKA    Limitations Walking;Standing;House hold activities    How long can you stand comfortably? 5-10 minutes    How long can you walk comfortably? 5-10 minutes    Patient Stated Goals improved strength and mobility, and be able to get up off of the floor    Currently in Pain? Yes    Pain Score 6     Pain Location Leg    Pain Orientation Right    Pain Descriptors / Indicators Aching    Pain Type Chronic pain    Pain Radiating Towards none    Pain Onset More than a month ago    Pain Frequency Intermittent    Aggravating Factors  walking and standing    Pain Relieving Factors heat    Effect of Pain on Daily Activities he notes that he has to reduce his activity level due to pain                [facility] PT Assessment - 05/06/22 0001       Assessment   Medical Diagnosis Decreased strength    Referring Provider (PT) Caryl NeverBurchette, MD    Onset Date/Surgical Date --  January 2023   Next MD Visit 05/24/22    Prior Therapy None since R TKA (2013)      Precautions   Precautions None      Restrictions   Weight Bearing Restrictions No      Balance Screen   Has the patient fallen in the past 6 months No    Has the patient had a decrease in activity level because of a fear of falling?  No    Is the patient reluctant to leave their home because of a fear of falling?  No      Home Nurse, mental health Private residence    Home Layout Two level    Alternate Level Stairs-Number of Steps 18   step to pattern   Alternate Level Stairs-Rails Right      Prior Function   Level of Independence Independent    Vocation Retired    Armed forces logistics/support/administrative officer, Presenter, broadcasting, and gardening      Cognition   Overall Cognitive Status Within Functional Limits for tasks assessed    Attention Focused    Focused Attention Appears intact    Memory Appears intact    Awareness Appears intact    Problem  Solving Appears intact      Sensation   Additional Comments Patient reports no numbness or tingling      ROM / Strength   AROM / PROM / Strength Strength      Strength   Strength Assessment Site Hip;Knee;Ankle    Right/Left Hip Left;Right    Right Hip Flexion 4/5    Left Hip Flexion 4/5    Right/Left Knee Right;Left    Right Knee Flexion 4+/5    Right Knee Extension 4+/5    Left Knee Flexion 4+/5    Left Knee Extension 5/5    Right/Left Ankle Right;Left    Right Ankle Dorsiflexion 4+/5    Left Ankle Dorsiflexion 4+/5      Transfers   Transfers Sit to Stand;Stand to Sit    Sit to Stand 7: Independent;Without upper extremity assist    Five time sit to stand comments  18.22 seconds    Stand to Sit 7: Independent;Without upper extremity assist      Ambulation/Gait   Ambulation/Gait Yes    Ambulation/Gait Assistance 6: Modified independent (Device/Increase time)    Assistive device None    Gait Pattern Step-through pattern;Wide base of support    Ambulation Surface Level;Indoor    Gait velocity 0.75 m/s   10.76 seconds to walk 26 feet 6 inches     Balance   Balance Assessed Yes      Static Standing Balance   Static Standing Balance -  Activities  Romberg - Eyes Opened;Romberg - Eyes Closed;Tandam Stance - Left Leg;Tandam Stance - Right Leg    Static Standing - Comment/# of Minutes Romberg: (EO: 30 seconds) (EC: 7 seconds) Tandem: 3 seconds each                        Objective measurements completed on examination: See above findings.                     PT Long Term Goals - 05/07/22 0753       PT LONG TERM GOAL #1   Title Patient will be independent with his HEP.    Time 6    Period Weeks    Status New    Target Date 06/17/22  PT LONG TERM GOAL #2   Title Patient will improve his five time sit to stand to 12 seconds or less for improved safety and lower extremity power.    Time 6    Period Weeks    Status New    Target Date  06/17/22      PT LONG TERM GOAL #3   Title Patient will improve his gait speed to at least 1.0 m/s for improved household and community mobility.    Time 6    Period Weeks    Status New    Target Date 06/17/22      PT LONG TERM GOAL #4   Title Patient will report being able to walk at least 20 minutes without being limited by fatigue or lower extremity pain.    Time 6    Period Weeks    Status New    Target Date 06/17/22                    Plan - 05/06/22 1109     Clinical Impression Statement Patient is a 76 year old male presenting to physical therapy with decreased strength which he feels begun after he began to experience leg pain in January 2023. He exhibited reduced muscular strength and power as evidenced by manual muscle testing and five time sit to stand. He is at an elevated fall risk which is evidenced by his his gait speed, five time sit to stand, and his balance with a narrow base of support. Recommend that he continue with skilled physical therapy to address his remaining impairments to maximize his safety and functional mobility.    Personal Factors and Comorbidities Comorbidity 2;Time since onset of injury/illness/exacerbation    Comorbidities HTN, DM    Examination-Activity Limitations Locomotion Level;Transfers;Stairs;Stand    Examination-Participation Restrictions Cleaning;Community Activity;Yard Work    Conservation officer, historic buildings Evolving/Moderate complexity    Clinical Decision Making Moderate    Rehab Potential Good    PT Frequency 2x / week    PT Duration 6 weeks    PT Treatment/Interventions ADLs/Self Care Home Management;Cryotherapy;Electrical Stimulation;Moist Heat;Neuromuscular re-education;Balance training;Therapeutic exercise;Therapeutic activities;Functional mobility training;Stair training;Gait training;Patient/family education;Manual techniques;Vasopneumatic Device    PT Next Visit Plan nustep, lower extremity strengthening, and balance  interventions    PT Home Exercise Plan Access Code: [email or web address]  URL: [email or web address]  Date: 05/06/2022  Prepared by: Candi Leash    Exercises  - Heel Toe Raises with Counter Support  - 2 x daily - 7 x weekly - 2 sets - 10 reps  - Standing Hip Extension with Counter Support  - 2 x daily - 7 x weekly - 2 sets - 10 reps  - Side Stepping with Counter Support  - 2 x daily - 7 x weekly - 2 sets - 10 reps    Consulted and Agree with Plan of Care Patient             Patient will benefit from skilled therapeutic intervention in order to improve the following deficits and impairments:  Difficulty walking, Abnormal gait, Decreased activity tolerance, Pain, Decreased balance, Decreased strength, Decreased mobility  Visit Diagnosis: Muscle weakness (generalized)  Unsteadiness on feet     Problem List Patient Active Problem List   Diagnosis Date Noted   Type 2 diabetes mellitus, controlled (HCC) 07/04/2015   Hyperglycemia 02/02/2015   Allergic rhinitis 05/14/2013   Hyperlipemia 03/28/2010   ACUTE SEROUS OTITIS MEDIA 02/22/2010   OBESITY, MODERATE 04/25/2009   ERECTILE DYSFUNCTION 04/25/2009  Essential hypertension 03/24/2009   GERD 03/24/2009   Rationale for Evaluation and Treatment Rehabilitation   Granville Lewis, PT 05/07/2022, 7:57 AM  [facility] [address] [place], [place], [address] Phone: [phone]   Fax:  [phone]  Name: Clifford Gibson MRN: [id number] Date of Birth: 1946-11-13

## 2022-05-08 ENCOUNTER — Ambulatory Visit: Payer: Medicare PPO

## 2022-05-09 ENCOUNTER — Ambulatory Visit: Payer: Medicare PPO

## 2022-05-09 ENCOUNTER — Telehealth: Payer: Self-pay | Admitting: Pharmacist

## 2022-05-09 DIAGNOSIS — R2681 Unsteadiness on feet: Secondary | ICD-10-CM | POA: Diagnosis not present

## 2022-05-09 DIAGNOSIS — R531 Weakness: Secondary | ICD-10-CM | POA: Diagnosis not present

## 2022-05-09 DIAGNOSIS — M6281 Muscle weakness (generalized): Secondary | ICD-10-CM | POA: Diagnosis not present

## 2022-05-09 NOTE — Chronic Care Management (AMB) (Signed)
Chronic Care Management Pharmacy Assistant   Name: Clifford Gibson  MRN: [id number] DOB: Jul 02, 1946  Clifford Gibson is an 76 y.o. year old male who presents for his initial CCM visit with the clinical pharmacist.  Reason for Encounter: Chart prep for initial visit with Jeni Salles Clinical Pharmacist on 05/13/2022 at 11:00 in the office.    Conditions to be addressed/monitored: HTN, HLD, DMII, and GERD  Recent office visits:  04/22/2022 [name] MD - Patient was seen for cognitive impairment and additional issues. Referral to Neuropsychology. Started Donepezil 5 mg qhs. Follow up in 1 months.    04/16/2022 [name] MD - Patient was seen for Essential hypertension and additional issues. No medication changes. Patient declined taking Crestor due to side effects of this medication. No follow up, patient to set up 30 min. appointment soon.  01/25/2022 Rolene Arbour LPN - Medicare annual wellness exam  Recent consult visits:  05/06/2022 Jacqulynn Cadet PT ([facility]) - Patient was seen for Muscle weakness and Unsteadiness on feet. No medication changes. Follow up 2 times per week for 6 weeks.   11/16/2021 Stevan Born FNP ([facility]) - Patient was seen for positive home coved test. Started Paxlovid. No follow up noted.   Hospital visits:  None  Medications: Outpatient Encounter Medications as of 05/09/2022  Medication Sig   ACCU-CHEK FASTCLIX LANCETS MISC Test once daily Dx E11.9   aspirin EC 81 MG tablet Take 81 mg by mouth daily.   donepezil (ARICEPT) 5 MG tablet Take 1 tablet (5 mg total) by mouth at bedtime.   fluticasone (FLONASE) 50 MCG/ACT nasal spray 1 SPRAY NASALLY ONCE A DAY   glucose blood (ACCU-CHEK AVIVA) test strip Test once daily. E11.9   meloxicam (MOBIC) 15 MG tablet Take 15 mg by mouth daily. (Patient not taking: Reported on 05/07/2022)   metFORMIN (GLUCOPHAGE) 500 MG tablet TAKE 2 TABLETS TWO TIMES DAILY WITH A MEAL   Omega 3  1200 MG CAPS Take by mouth.   rosuvastatin (CRESTOR) 5 MG tablet Take 1 tablet (5 mg total) by mouth daily. (Patient not taking: Reported on 05/07/2022)   valsartan-hydrochlorothiazide (DIOVAN-HCT) 160-12.5 MG tablet TAKE 1 TABLET AT BEDTIME   vitamin C (ASCORBIC ACID) 500 MG tablet Take 500 mg by mouth daily.   No facility-administered encounter medications on file as of 05/09/2022.  Fill History: donepezil 5 mg tablet 04/22/2022 30   FLUTICASONE PROP 5 12/27/2019 90   metformin 500 mg tablet 04/08/2022 90   valsartan 160 mg-hydrochlorothiazide 12.5 mg tablet 03/11/2022 90   Have you seen any other providers since your last visit? **Patient wife denies  Any changes in your medications or health? Patients wife states not new issues aside from what is in his chart.   Any side effects from any medications? Patients wife denies any known side effects.   Do you have an symptoms or problems not managed by your medications? Patients wife states they have questions and would like to discuss this at his visit.   Any concerns about your health right now? Patients wife states they do have questions about his current health issues they would like to discuss at his visit.   Has your provider asked that you check blood pressure, blood sugar, or follow special diet at home? Patients wife states he is not following any specific diet, just being mindful.  She states his daughter is checking his blood sugars.   Do you get any type of exercise  on a regular basis? Patients wife states nothing at this time except for physical therapy  Can you think of a goal you would like to reach for your health? Patients wife states just to get him feeling better and figure out the current issues  Do you have any problems getting your medications? Patients wife denies  Is there anything that you would like to discuss during the appointment? Patients wife denies.   Patients wife reminded of appointment date and time and  to bring BP monitor, any BP or BS logs, medications and supplements to appointment.   Care Gaps: AWV - scheduled 01/20/2023 Last BP - 134/80 on 04/22/2022 Last A1C - 6.3 on 04/16/2022 Covid booster - overdue Foot exam - overdue Eye exam - overdue  Star Rating Drugs: Metformin 500 mg - last filled 04/08/2022 90 DS at [facility]. Valsartan HCTZ 160/12.5 mg - last filled 03/11/2022 90 DS at [facility]. Rosuvastatin 5 mg - last filled 08/23/2019 90 DS at [facility]. Note in chart dated 04/16/2022, patient declined to take due to side effects from this class of medication.   [place] Pharmacist Assistant [phone]

## 2022-05-09 NOTE — Therapy (Signed)
[facility] [address] [place], [place], [address] Phone: [phone]   Fax:  [phone]  Physical Therapy Treatment  Patient Details  Name: Clifford Gibson MRN: [id number] Date of Birth: 23-Mar-1946 Referring Provider (PT): Caryl Never, MD   Encounter Date: 05/09/2022   PT End of Session - 05/09/22 1109     Visit Number 2    Number of Visits 12    Date for PT Re-Evaluation 07/26/22    PT Start Time 1030    PT Stop Time 1115    PT Time Calculation (min) 45 min    Activity Tolerance Patient tolerated treatment well    Behavior During Therapy [facility] for tasks assessed/performed             Past Medical History:  Diagnosis Date   Acute serous otitis media 02/22/2010   Arthritis    Diabetes mellitus without complication (HCC)    ERECTILE DYSFUNCTION 04/25/2009   GERD 03/24/2009   HYPERLIPIDEMIA 03/28/2010   HYPERTENSION 03/24/2009   OBESITY, MODERATE 04/25/2009   PONV (postoperative nausea and vomiting)     Past Surgical History:  Procedure Laterality Date   COLONOSCOPY N/A 06/21/2015   Procedure: COLONOSCOPY;  Surgeon: Malissa Hippo, MD;  Location: [facility];  Service: Endoscopy;  Laterality: N/A;  930 - moved to 7/20 @ 7:30   NASAL SEPTUM SURGERY  1974   TONSILLECTOMY     3 yrs. old   TOTAL KNEE ARTHROPLASTY  06/11/2012   Procedure: TOTAL KNEE ARTHROPLASTY;  Surgeon: Javier Docker, MD;  Location: [facility];  Service: Orthopedics;  Laterality: Right;    There were no vitals filed for this visit.   Subjective Assessment - 05/09/22 1038     Subjective Patient reports that his hip are sore this morning.    Pertinent History R TKA    Limitations Walking;Standing;House hold activities    How long can you stand comfortably? 5-10 minutes    How long can you walk comfortably? 5-10 minutes    Patient Stated Goals improved strength and mobility, and be able to get up off of the floor    Currently in Pain? Yes    Pain Score 4      Pain Location Hip    Pain Orientation Right;Left   R>L   Pain Descriptors / Indicators Sore    Pain Onset More than a month ago                               [facility] Adult PT Treatment/Exercise - 05/09/22 0001       Exercises   Exercises Knee/Hip      Knee/Hip Exercises: Stretches   Passive Hamstring Stretch Both;3 reps;30 seconds      Knee/Hip Exercises: Aerobic   Nustep L4 x 10 minutes      Knee/Hip Exercises: Standing   Knee Flexion Both;20 reps    Rocker Board 5 minutes    Other Standing Knee Exercises Side stepping   2 minutes with UE support     Knee/Hip Exercises: Seated   Long Arc Quad Both;20 reps;Weights    Long Arc Quad Weight 2 lbs.    Clamshell with Carman Ching   30 reps                Balance Exercises - 05/09/22 0001       Balance Exercises: Standing   Marching Foam/compliant surface;Upper extremity assist 2;Static  2 minutes                    PT Long Term Goals - 05/07/22 0753       PT LONG TERM GOAL #1   Title Patient will be independent with his HEP.    Time 6    Period Weeks    Status New    Target Date 06/17/22      PT LONG TERM GOAL #2   Title Patient will improve his five time sit to stand to 12 seconds or less for improved safety and lower extremity power.    Time 6    Period Weeks    Status New    Target Date 06/17/22      PT LONG TERM GOAL #3   Title Patient will improve his gait speed to at least 1.0 m/s for improved household and community mobility.    Time 6    Period Weeks    Status New    Target Date 06/17/22      PT LONG TERM GOAL #4   Title Patient will report being able to walk at least 20 minutes without being limited by fatigue or lower extremity pain.    Time 6    Period Weeks    Status New    Target Date 06/17/22                   Plan - 05/09/22 1109     Clinical Impression Statement Patient was introduced to multiple new interventions for improved  lower extremity strength needed for improved function with his daily activites. He required minimal cueing for today's new interventions for proper biomechanics to facilitate proper muscular engagement. He reported feeling a little tired, but no pain upon the conclusion of treatment. He continues to require skilled physical therapy to address his remaining impairments to maximize his functional mobility and safety.    Personal Factors and Comorbidities Comorbidity 2;Time since onset of injury/illness/exacerbation    Comorbidities HTN, DM    Examination-Activity Limitations Locomotion Level;Transfers;Stairs;Stand    Examination-Participation Restrictions Cleaning;Community Activity;Yard Work    Conservation officer, historic buildingstability/Clinical Decision Making Evolving/Moderate complexity    Rehab Potential Good    PT Frequency 2x / week    PT Duration 6 weeks    PT Treatment/Interventions ADLs/Self Care Home Management;Cryotherapy;Electrical Stimulation;Moist Heat;Neuromuscular re-education;Balance training;Therapeutic exercise;Therapeutic activities;Functional mobility training;Stair training;Gait training;Patient/family education;Manual techniques;Vasopneumatic Device    PT Next Visit Plan nustep, lower extremity strengthening, and balance interventions    PT Home Exercise Plan Access Code: [email or web address]  URL: [email or web address]  Date: 05/09/2022  Prepared by: Candi LeashJarrett [place]    Exercises  - Supine Hamstring Stretch with Strap  - 1 x daily - 7 x weekly - 3 sets - 30 seconds  hold  - Seated Hamstring Stretch  - 1 x daily - 7 x weekly - 3 sets - 30 seconds  hold  - Standing Hamstring Stretch with Step  - 1 x daily - 7 x weekly - 3 sets - 30 seconds hold  - Standing March with Counter Support  - 1 x daily - 7 x weekly - 3 sets - 10 reps  - Side Stepping with Counter Support  - 1 x daily - 7 x weekly - 3 sets - 10 reps  - Heel Toe Raises with Counter Support  - 1 x daily - 7 x weekly - 3 sets - 10 reps  - Seated Hip Abduction  with Resistance  - 1  x daily - 7 x weekly - 3 sets - 10 reps    Consulted and Agree with Plan of Care Patient             Patient will benefit from skilled therapeutic intervention in order to improve the following deficits and impairments:  Difficulty walking, Abnormal gait, Decreased activity tolerance, Pain, Decreased balance, Decreased strength, Decreased mobility  Visit Diagnosis: Muscle weakness (generalized)  Unsteadiness on feet     Problem List Patient Active Problem List   Diagnosis Date Noted   Type 2 diabetes mellitus, controlled (HCC) 07/04/2015   Hyperglycemia 02/02/2015   Allergic rhinitis 05/14/2013   Hyperlipemia 03/28/2010   ACUTE SEROUS OTITIS MEDIA 02/22/2010   OBESITY, MODERATE 04/25/2009   ERECTILE DYSFUNCTION 04/25/2009   Essential hypertension 03/24/2009   GERD 03/24/2009   Rationale for Evaluation and Treatment Rehabilitation   Granville Lewis, PT 05/09/2022, 5:12 PM  [facility] [address] [place], [place], [address] Phone: [phone]   Fax:  [phone]  Name: JIAN HODGMAN MRN: [id number] Date of Birth: Dec 24, 1945

## 2022-05-10 ENCOUNTER — Encounter: Payer: Self-pay | Admitting: Family Medicine

## 2022-05-11 ENCOUNTER — Ambulatory Visit
Admission: RE | Admit: 2022-05-11 | Discharge: 2022-05-11 | Disposition: A | Discharge status: Home / Self Care | Payer: Medicare PPO | Source: Ambulatory Visit | Attending: Family Medicine | Admitting: Family Medicine

## 2022-05-11 DIAGNOSIS — R531 Weakness: Secondary | ICD-10-CM | POA: Diagnosis not present

## 2022-05-11 DIAGNOSIS — R262 Difficulty in walking, not elsewhere classified: Secondary | ICD-10-CM | POA: Diagnosis not present

## 2022-05-11 DIAGNOSIS — R4189 Other symptoms and signs involving cognitive functions and awareness: Secondary | ICD-10-CM

## 2022-05-11 DIAGNOSIS — R9082 White matter disease, unspecified: Secondary | ICD-10-CM | POA: Diagnosis not present

## 2022-05-11 DIAGNOSIS — R413 Other amnesia: Secondary | ICD-10-CM | POA: Diagnosis not present

## 2022-05-13 ENCOUNTER — Ambulatory Visit (INDEPENDENT_AMBULATORY_CARE_PROVIDER_SITE_OTHER): Payer: Medicare PPO | Admitting: Pharmacist

## 2022-05-13 VITALS — BP 142/80

## 2022-05-13 DIAGNOSIS — I10 Essential (primary) hypertension: Secondary | ICD-10-CM

## 2022-05-13 DIAGNOSIS — E785 Hyperlipidemia, unspecified: Secondary | ICD-10-CM

## 2022-05-13 DIAGNOSIS — E119 Type 2 diabetes mellitus without complications: Secondary | ICD-10-CM

## 2022-05-13 MED ORDER — REPATHA SURECLICK 140 MG/ML ~~LOC~~ SOAJ: 140.0000 mg | SUBCUTANEOUS | 1 refills | Status: DC

## 2022-05-13 NOTE — Progress Notes (Signed)
Chronic Care Management Pharmacy Note  05/13/2022 Name:  Clifford Gibson MRN:  [id number] DOB:  05/31/1946  Summary: LDL not at goal < 70 A1c at goal < 7%  Recommendations/Changes made from today's visit: -Recommended moving valsartan-HCTZ to breakfast time to avoid nocturia -Recommended starting Repatha 140 mg inject every 14 days with follow up lipid panel in 2-3 months -Recommended taking metformin after eating to minimize GI side effects -Recommended routine monitoring of BGs at home  Plan: Follow up lipid panel in 2-3 months Follow up after results of lipid panel   Subjective: Clifford Gibson is an 76 y.o. year old male who is a primary patient of Burchette, Alinda Sierras, MD.  The CCM team was consulted for assistance with disease management and care coordination needs.    Engaged with patient face to face for initial visit in response to provider referral for pharmacy case management and/or care coordination services.   Consent to Services:  The patient was given the following information about Chronic Care Management services today, agreed to services, and gave verbal consent: 1. CCM service includes personalized support from designated clinical staff supervised by the primary care provider, including individualized plan of care and coordination with other care providers 2. 24/7 contact phone numbers for assistance for urgent and routine care needs. 3. Service will only be billed when office clinical staff spend 20 minutes or more in a month to coordinate care. 4. Only one practitioner may furnish and bill the service in a calendar month. 5.The patient may stop CCM services at any time (effective at the end of the month) by phone call to the office staff. 6. The patient will be responsible for cost sharing (co-pay) of up to 20% of the service fee (after annual deductible is met). Patient agreed to services and consent obtained.  Patient Care Team: Eulas Post, MD as PCP -  General Viona Gilmore, [facility] as Pharmacist (Pharmacist)  Recent office visits: 04/22/2022 [name] MD - Patient was seen for cognitive impairment and additional issues. Referral to Neuropsychology. Started Donepezil 5 mg qhs. Follow up in 1 months.    04/16/2022 [name] MD - Patient was seen for Essential hypertension and additional issues. No medication changes. Patient declined taking Crestor due to side effects of this medication. No follow up, patient to set up 30 min. appointment soon.   01/25/2022 Rolene Arbour LPN - Medicare annual wellness exam.  Recent consult visits: 05/09/22 Jacqulynn Cadet, PT (outpatient rehab): Patient presented for PT treatment for muscle weakness.  05/06/2022 Jacqulynn Cadet PT (OP Rehab) - Patient was seen for Muscle weakness and Unsteadiness on feet. No medication changes. Follow up 2 times per week for 6 weeks.    11/16/2021 Stevan Born FNP ([facility]gent [facility]) - Patient was seen for positive home coved test. Started Paxlovid. No follow up noted.   Hospital visits: None in previous 6 months   Objective:  Lab Results  Component Value Date   CREATININE 1.41 04/16/2022   BUN 28 (H) 04/16/2022   GFR 48.13 (L) 04/16/2022   GFRNONAA 84 (L) 06/12/2012   GFRAA >90 06/12/2012   NA 139 04/16/2022   K 4.4 04/16/2022   CALCIUM 10.0 04/16/2022   CO2 28 04/16/2022   GLUCOSE 88 04/16/2022    Lab Results  Component Value Date/Time   HGBA1C 6.3 04/16/2022 03:31 PM   HGBA1C 6.5 02/19/2021 09:09 AM   HGBA1C 6.6 (H) 04/25/2020 08:54 AM   GFR 48.13 (L)  04/16/2022 03:31 PM   GFR 61.32 02/19/2021 09:40 AM   MICROALBUR 1.0 04/22/2022 03:48 PM    Last diabetic Eye exam:  Lab Results  Component Value Date/Time   HMDIABEYEEXA No Retinopathy 05/02/2022 12:00 AM    Last diabetic Foot exam:  Lab Results  Component Value Date/Time   HMDIABFOOTEX normal 02/21/2016 12:00 AM     Lab Results  Component Value Date   CHOL 256 (H)  04/16/2022   HDL 50.30 04/16/2022   LDLCALC 82 07/23/2019   LDLDIRECT 172.0 04/16/2022   TRIG 203.0 (H) 04/16/2022   CHOLHDL 5 04/16/2022       Latest Ref Rng & Units 04/16/2022    3:31 PM 04/25/2020    8:54 AM 07/23/2019    8:31 AM  Hepatic Function  Total Protein 6.0 - 8.3 g/dL 7.1  6.7  6.8   Albumin 3.5 - 5.2 g/dL 4.9  4.7  5.0   AST 0 - 37 U/L _0 ALT 0 - 53 U/L _1 Alk Phosphatase 39 - 117 U/L 49  47  40   Total Bilirubin 0.2 - 1.2 mg/dL 0.6  0.5  0.5   Bilirubin, Direct 0.0 - 0.3 mg/dL 0.1  0.1  0.1     Lab Results  Component Value Date/Time   TSH 1.24 04/16/2022 03:31 PM   TSH 1.39 04/25/2020 08:54 AM       Latest Ref Rng & Units 04/25/2020    8:54 AM 10/20/2018   12:12 PM 10/28/2017   10:33 AM  CBC  WBC 4.0 - 10.5 K/uL 9.3  8.7  7.8   Hemoglobin 13.0 - 17.0 g/dL 14.0  14.1  14.3   Hematocrit 39.0 - 52.0 % 41.0  41.4  43.3   Platelets 150.0 - 400.0 K/uL 290.0  281.0  289.0     No results found for: "VD25OH"  Clinical ASCVD: No  The 10-year ASCVD risk score (Arnett DK, et al., 2019) is: 61.4%   Values used to calculate the score:     Age: 76 years     Sex: Male     Is Non-Hispanic African American: No     Diabetic: Yes     Tobacco smoker: No     Systolic Blood Pressure: 503 mmHg     Is BP treated: Yes     HDL Cholesterol: 50.3 mg/dL     Total Cholesterol: 256 mg/dL       01/25/2022    2:41 PM 02/15/2021    9:09 AM 11/30/2019    1:48 PM  Depression screen PHQ 2/9  Decreased Interest 0 0 0  Down, Depressed, Hopeless 0 0 0  PHQ - 2 Score 0 0 0      Social History   Tobacco Use  Smoking Status Never  Smokeless Tobacco Never   BP Readings from Last 3 Encounters:  05/13/22 (!) 142/80  04/22/22 134/80  04/16/22 138/80   Pulse Readings from Last 3 Encounters:  04/22/22 91  04/16/22 86  02/19/21 92   Wt Readings from Last 3 Encounters:  04/22/22 220 lb (99.8 kg)  04/16/22 214 lb 12.8 oz (97.4 kg)  02/19/21 223 lb 4 oz  (101.3 kg)   BMI Readings from Last 3 Encounters:  04/22/22 33.45 kg/m  04/16/22 32.66 kg/m  01/25/22 33.95 kg/m    Assessment/Interventions: Review of patient past medical history, allergies, medications, health status, including review of consultants reports, laboratory and  other test data, was performed as part of comprehensive evaluation and provision of chronic care management services.   SDOH:  (Social Determinants of Health) assessments and interventions performed: Yes SDOH Interventions    Flowsheet Row Most Recent Value  SDOH Interventions   Financial Strain Interventions Intervention Not Indicated  Transportation Interventions Intervention Not Indicated      SDOH Screenings   Alcohol Screen: Low Risk  (01/25/2022)   Alcohol Screen    Last Alcohol Screening Score (AUDIT): 0  Depression (PHQ2-9): Low Risk  (01/25/2022)   Depression (PHQ2-9)    PHQ-2 Score: 0  Financial Resource Strain: Low Risk  (05/13/2022)   Overall Financial Resource Strain (CARDIA)    Difficulty of Paying Living Expenses: Not hard at all  Food Insecurity: No Food Insecurity (04/16/2022)   Hunger Vital Sign    Worried About Running Out of Food in the Last Year: Never true    [place] in the Last Year: Never true  Housing: Low Risk  (04/16/2022)   Housing    Last Housing Risk Score: 0  Physical Activity: Inactive (04/16/2022)   Exercise Vital Sign    Days of Exercise per Week: 0 days    Minutes of Exercise per Session: 40 min  Social Connections: Socially Integrated (04/16/2022)   Social Connection and Isolation Panel [NHANES]    Frequency of Communication with Friends and Family: Once a week    Frequency of Social Gatherings with Friends and Family: Twice a week    Attends Religious Services: More than 4 times per year    Active Member of [employer] or Organizations: Yes    Attends Archivist Meetings: 1 to 4 times per year    Marital Status: Married  Stress: No Stress Concern  Present (04/16/2022)   [employer]    Feeling of Stress : Only a little  Tobacco Use: Low Risk  (05/09/2022)   Patient History    Smoking Tobacco Use: Never    Smokeless Tobacco Use: Never    Passive Exposure: Not on file  Transportation Needs: No Transportation Needs (05/13/2022)   PRAPARE - Transportation    Lack of Transportation (Medical): No    Lack of Transportation (Non-Medical): No   Patients wife states not new issues aside from what is in his chart.   Patient's wife and patient presented to visit. Patient is most concerned about his current problems with joint pain as they limit his mobility and also his high cholesterol. Patient had difficult standing up with statins in the past and this affected his hips hurting and ankles. He has a sister-in law who also had similar side effects and attributes a lot of his joint pain to this plus arthritis. His brother was also recently diagnosed with polymyalgia rheumatica and he is wondering if this could be affecting him as well.  Patient and wife have a daughter in the medical field and she works at [employer]. She helps check his blood sugars at home and they ask for her advice with his medications and diagnoses often.  Patient's wife does most of the cooking and they do not eat out often and never eat fast food. He does usually eat regular meals and sometimes skips dinner if it's too late. They sometimes eat their last meals around 4:30-5pm and then go to bed around 10pm. He tries to stop eating around 7pm every night to avoid overnight heartburn.  Patient and wife deny side  effects with current medications with the exception of constipation possibly coming from the metformin. Patient denies issues with cost of medications and are very pleased with their current pharmacy.   CCM Care Plan  Allergies  Allergen Reactions   Lipitor [Atorvastatin Calcium] Other (See Comments)    Penicillins Other (See Comments)    Reaction=bleeding,diarrhea   Statins Other (See Comments)    Myalgia     Medications Reviewed Today     Reviewed by Viona Gilmore, [facility] (Pharmacist) on 05/13/22 at 1233  Med List Status: <None>   Medication Order Taking? Sig Documenting Provider Last Dose Status Informant  ACCU-CHEK FASTCLIX LANCETS MISC [id number]  Test once daily Dx E11.9 Eulas Post, MD  Active   Apple Cider Vinegar 600 MG CAPS [id number] Yes Take 1 capsule by mouth daily. [provider] Taking Active   aspirin EC 81 MG tablet [id number] No Take 81 mg by mouth daily.  Patient not taking: Reported on 05/13/2022   [provider] Not Taking Active   Cholecalciferol (VITAMIN D) 50 MCG (2000 UT) CAPS [id number] Yes Take 1 capsule by mouth daily. [provider] Taking Active   CINNAMON PO [id number] Yes Take 1,000 mg by mouth daily. [provider] Taking Active   docusate sodium (COLACE) 250 MG capsule [id number] Yes Take 250 mg by mouth in the morning and at bedtime. [provider] Taking Active   donepezil (ARICEPT) 5 MG tablet [id number] Yes Take 1 tablet (5 mg total) by mouth at bedtime. Eulas Post, MD Taking Active   fluticasone [facility]) 50 MCG/ACT nasal spray [id number] Yes 1 SPRAY NASALLY ONCE A DAY Eulas Post, MD Taking Active   Garlic 0998 MG CAPS [id number] Yes Take 1 capsule by mouth daily. [provider] Taking Active   glucose blood (ACCU-CHEK AVIVA) test strip [id number]  Test once daily. E11.9 Eulas Post, MD  Active   metFORMIN (GLUCOPHAGE) 500 MG tablet [id number] Yes TAKE 2 TABLETS TWO TIMES DAILY WITH A MEAL Burchette, Alinda Sierras, MD Taking Active   Misc Natural Products (YUMVS BEET ROOT-TART CHERRY) 250-0.5 MG CHEW [id number] Yes Chew 1 tablet by mouth daily. [provider] Taking Active   omeprazole (PRILOSEC) 20 MG capsule [id number] Yes Take 20 mg by mouth daily. [provider] Taking Active   Probiotic Product (PROBIOTIC ADVANCED PO) [id number] Yes Take 1 capsule by mouth daily. [provider] Taking Active   Psyllium 100 % PACK [id number] Yes Take 1 packet by mouth daily. [provider] Taking Active   valsartan-hydrochlorothiazide (DIOVAN-HCT) 160-12.5 MG tablet [id number] Yes TAKE 1 TABLET AT BEDTIME Eulas Post, MD Taking Active   Zinc 25 MG TABS [id number] Yes Take 1 tablet by mouth daily. [provider] Taking Active             Patient Active Problem List   Diagnosis Date Noted   Type 2 diabetes mellitus, controlled ([place]) 07/04/2015   Hyperglycemia 02/02/2015   Allergic rhinitis 05/14/2013   Hyperlipemia 03/28/2010   ACUTE SEROUS OTITIS MEDIA 02/22/2010   OBESITY, MODERATE 04/25/2009   ERECTILE DYSFUNCTION 04/25/2009   Essential hypertension 03/24/2009   GERD 03/24/2009    Immunization History  Administered Date(s) Administered   Fluad Quad(high Dose 65+) 09/18/2020   Influenza Whole 10/03/2011   Influenza, High Dose Seasonal PF 09/24/2016   Influenza,inj,Quad PF,6+ Mos 09/11/2018, 08/26/2019   Influenza-Unspecified 10/14/2015, 09/12/2017, 09/14/2018   MODERNA COVID-19 SARS-COV-2 PEDS BIVALENT BOOSTER 6Y-11Y 09/27/2020   Pneumococcal  Conjugate-13 02/03/2014   Pneumococcal Polysaccharide-23 03/28/2010, 09/14/2018   Td 01/07/2008   Tdap 12/01/2018   Zoster Recombinat (Shingrix) 09/14/2018, 11/17/2018    Conditions to be addressed/monitored:  Hypertension, Hyperlipidemia, Diabetes, GERD, Osteoarthritis, Allergic Rhinitis, and Memory loss  Care Plan : [place]  Updates made by Viona Gilmore, [place] since 05/13/2022 12:00 AM     Problem: Problem: Hypertension, Hyperlipidemia, Diabetes, GERD, Osteoarthritis, Allergic Rhinitis, and Memory loss      Long-Range Goal: Patient-Specific Goal   Start Date: 05/13/2022  Expected End Date: 05/14/2023  This Visit's Progress: On track   Priority: High  Note:   Current Barriers:  Unable to independently monitor therapeutic efficacy Unable to achieve control of cholesterol   Pharmacist Clinical Goal(s):  Patient will achieve adherence to monitoring guidelines and medication adherence to achieve therapeutic efficacy achieve control of cholesterol as evidenced by lipid panel  through collaboration with PharmD and provider.   Interventions: 1:1 collaboration with Eulas Post, MD regarding development and update of comprehensive plan of care as evidenced by provider attestation and co-signature Inter-disciplinary care team collaboration (see longitudinal plan of care) Comprehensive medication review performed; medication list updated in electronic medical record  Hypertension (BP goal <140/90) -Controlled -Current treatment: Valsartan-HCTZ 160-12.5 mg 1 tablet at bedtime - Appropriate, Effective, Safe, Accessible -Medications previously tried: n/a  -Current home readings: 125-135/80; 130/80 - checking weekly -Current dietary habits: pays attention to salt intake; doesn't add salt except corn or tomatoes; no frozen meals or not soup often -Current exercise habits: limited due to joint pain -Denies hypotensive/hypertensive symptoms -Educated on BP goals and benefits of medications for prevention of heart attack, stroke and kidney damage; Importance of home blood pressure monitoring; Proper BP monitoring technique; -Counseled to monitor BP at home weekly, document, and provide log at future appointments -Counseled on diet and exercise extensively Recommended to continue current medication Recommended moving valsartan-HCTZ to breakfast time to avoid nighttime awakenings.  Hyperlipidemia: (LDL goal < 70) -Uncontrolled -Current treatment: No medications -Medications previously tried: Lipitor, Crestor, Zocor (joint pain), fish oil (indigestion)  -Current dietary patterns:  not often eating out - not fast food  people; does use butter and olive oil  -Current exercise habits: limited due to pain; doing PT twice a week and some at home; 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 walking and owns lots of land -Educated on Cholesterol goals;  Importance of limiting foods high in cholesterol; Exercise goal of 150 minutes per week; -Counseled on diet and exercise extensively Counseled on benefits and risk effects of PCSK9 inhibitor. Recommended starting Repatha 140 mg inject every 14 days with follow up lipid panel in 2-3 months.  Diabetes (A1c goal <7%) -Controlled -Current medications: Metformin 500 mg 2 tablets twice daily - Appropriate, Effective, Safe, Accessible -Medications previously tried: none  -Current home glucose readings fasting glucose: not checking often post prandial glucose: not checking often -Denies hypoglycemic/hyperglycemic symptoms -Current meal patterns:  breakfast: n/a  lunch: n/a  dinner: n/a snacks: n/a drinks: n/a -Current exercise: n/a -Educated on A1c and blood sugar goals; Benefits of routine self-monitoring of blood sugar; Carbohydrate counting and/or plate method -Counseled to check feet daily and get yearly eye exams -Counseled on diet and exercise extensively Recommended to continue current medication Recommended taking metformin after eating to minimize GI side effects.  Memory loss (Goal: slow progression of memory loss) -Controlled -Current treatment  Donepezil 5 mg 1 tablet at bedtime - Appropriate, Effective, Safe, Accessible -Medications previously tried: none  -Recommended to continue  current medication  GERD (Goal: minimize symptoms) -Not ideally controlled -Current treatment  Omeprazole 20 mg 1 capsule daily - Appropriate, Effective, Safe, Accessible Apple cider vinegar 600 mg 1 tablet daily - Appropriate, Effective, Safe, Accessible -Medications previously tried: none  -Counseled on non-pharmacologic management of symptoms such as elevating the head of your bed,  avoiding eating 2-3 hours before bed, avoiding triggering foods such as acidic, spicy, or fatty foods, eating smaller meals, and wearing clothes that are loose around the waist.   Health Maintenance -Vaccine gaps: COVID booster -Current therapy:  Aspirin 81 mg 1 tablet daily - ran out Zinc 25 mg 1 tablet daily Vitamin D 2000 units 1 capsule daily Cinnamon 1000 mg 1 capsule daily  Garlic 7253 mg 2 capsules daily  Probiotic 1 capsule daily Beet root 1 capsule daily Psyllium packet 1 packet daily Docusate 250 mg 1 capsule twice daily -Educated on Cost vs benefit of each product must be carefully weighed by individual consumer - Patient is not sure all of these supplements are helping. -Recommended cutting back on garlic if unable to find benefit.  Patient Goals/Self-Care Activities Patient will:  - take medications as prescribed as evidenced by patient report and record review check glucose 1-2 times a week, document, and provide at future appointments check blood pressure weekly, document, and provide at future appointments target a minimum of 150 minutes of moderate intensity exercise weekly engage in dietary modifications by increasing fiber intake.  Follow Up Plan: The care management team will reach out to the patient again over the next 7 days.         Medication Assistance: None required.  Patient affirms current coverage meets needs.  Compliance/Adherence/Medication fill history: Care Gaps: COVID booster, foot exam Last BP - 134/80 on 04/22/2022 Last A1C - 6.3 on 04/16/2022  Star-Rating Drugs: Metformin 500 mg - last filled 04/08/2022 90 DS at [facility]. Valsartan HCTZ 160/12.5 mg - last filled 03/11/2022 90 DS at [facility]. Rosuvastatin 5 mg - last filled 08/23/2019 90 DS at [facility]. Note in chart dated 04/16/2022, patient declined to take due to side effects from this class of medication  Patient's preferred pharmacy is:  [place], [address] [place] [address] Phone: [phone] Fax: [place] [address], [place] - [place] [address] [place] [address] Phone: [phone] Fax: [phone]  Uses pill box? No - in medicine drawer and puts them in a bathroom cup Pt endorses 99% compliance  (recommended back up plan for checking - ie. Pillbox, marking a calendar, flipping pill bottles upside down when taken, etc.)  We discussed: Current pharmacy is preferred with insurance plan and patient is satisfied with pharmacy services Patient decided to: Continue current medication management strategy  Care Plan and Follow Up Patient Decision:  Patient agrees to Care Plan and Follow-up.  Plan: The care management team will reach out to the patient again over the next 7 days.  Jeni Salles, PharmD, [place] Pharmacist [facility]

## 2022-05-13 NOTE — Addendum Note (Signed)
Addended by: Kristian Covey on: 05/13/2022 08:21 PM   Modules accepted: Orders

## 2022-05-13 NOTE — Patient Instructions (Addendum)
Hi Finneus and Cordelia Penthel,  It was great to get to meet you in person! Below is a summary of some of the topics we discussed.   Don't forget to: -Start checking blood sugars 1-2 times a week to get some readings in between A1c checks -Take metformin after you finish eating to see if this helps with constipation -Consider cutting back or stopping Garlic if you haven't noticed a difference with taking it -Take valsartan-HCTZ in the morning to see if this helps to minimize trips to the bathroom during the night  Please reach out to me if you have any questions or need anything before I reach back out!  Best, Maddie  Gaylord ShihMadeline Jenisis Harmsen, PharmD, [facility]ACP Clinical Pharmacist [facility] [phone]   Visit Information   Goals Addressed             This Visit's Progress    Manage My Medicine       Timeframe:  Long-Range Goal Priority:  Medium Start Date:                             Expected End Date:                       Follow Up Date 08/13/22    - keep a list of all the medicines I take; vitamins and herbals too - use an alarm clock or phone to remind me to take my medicine  -Consider use of a pillbox or alternative method as a second check for taking medications (flipping bottles over once you take them, marking your calendar, etc.)   Why is this important?   These steps will help you keep on track with your medicines.   Notes:      Monitor and Manage My Blood Sugar-Diabetes Type 2       Timeframe:  Long-Range Goal Priority:  High Start Date:                             Expected End Date:                       Follow Up Date 08/13/22    - check blood sugar if I feel it is too high or too low - take the blood sugar log to all doctor visits -check blood sugar 2 times a week    Why is this important?   Checking your blood sugar at home helps to keep it from getting very high or very low.  Writing the results in a diary or log helps the doctor know how to  care for you.  Your blood sugar log should have the time, date and the results.  Also, write down the amount of insulin or other medicine that you take.  Other information, like what you ate, exercise done and how you were feeling, will also be helpful.     Notes:        Patient Care Plan: CCM Pharmacy Care Plan     Problem Identified: Problem: Hypertension, Hyperlipidemia, Diabetes, GERD, Osteoarthritis, Allergic Rhinitis, and Memory loss      Long-Range Goal: Patient-Specific Goal   Start Date: 05/13/2022  Expected End Date: 05/14/2023  This Visit's Progress: On track  Priority: High  Note:   Current Barriers:  Unable to independently monitor therapeutic efficacy Unable to achieve control of  cholesterol   Pharmacist Clinical Goal(s):  Patient will achieve adherence to monitoring guidelines and medication adherence to achieve therapeutic efficacy achieve control of cholesterol as evidenced by lipid panel  through collaboration with PharmD and provider.   Interventions: 1:1 collaboration with Kristian Covey, MD regarding development and update of comprehensive plan of care as evidenced by provider attestation and co-signature Inter-disciplinary care team collaboration (see longitudinal plan of care) Comprehensive medication review performed; medication list updated in electronic medical record  Hypertension (BP goal <140/90) -Controlled -Current treatment: Valsartan-HCTZ 160-12.5 mg 1 tablet at bedtime - Appropriate, Effective, Safe, Accessible -Medications previously tried: n/a  -Current home readings: 125-135/80; 130/80 - checking weekly -Current dietary habits: pays attention to salt intake; doesn't add salt except corn or tomatoes; no frozen meals or not soup often -Current exercise habits: limited due to joint pain -Denies hypotensive/hypertensive symptoms -Educated on BP goals and benefits of medications for prevention of heart attack, stroke and kidney  damage; Importance of home blood pressure monitoring; Proper BP monitoring technique; -Counseled to monitor BP at home weekly, document, and provide log at future appointments -Counseled on diet and exercise extensively Recommended to continue current medication Recommended moving valsartan-HCTZ to breakfast time to avoid nighttime awakenings.  Hyperlipidemia: (LDL goal < 70) -Uncontrolled -Current treatment: No medications -Medications previously tried: Lipitor, Crestor, Zocor (joint pain), fish oil (indigestion)  -Current dietary patterns:  not often eating out - not fast food people; does use butter and olive oil  -Current exercise habits: limited due to pain; doing PT twice a week and some at home; 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 walking and owns lots of land -Educated on Cholesterol goals;  Importance of limiting foods high in cholesterol; Exercise goal of 150 minutes per week; -Counseled on diet and exercise extensively Counseled on benefits and risk effects of PCSK9 inhibitor. Recommended starting Repatha 140 mg inject every 14 days with follow up lipid panel in 2-3 months.  Diabetes (A1c goal <7%) -Controlled -Current medications: Metformin 500 mg 2 tablets twice daily - Appropriate, Effective, Safe, Accessible -Medications previously tried: none  -Current home glucose readings fasting glucose: not checking often post prandial glucose: not checking often -Denies hypoglycemic/hyperglycemic symptoms -Current meal patterns:  breakfast: n/a  lunch: n/a  dinner: n/a snacks: n/a drinks: n/a -Current exercise: n/a -Educated on A1c and blood sugar goals; Benefits of routine self-monitoring of blood sugar; Carbohydrate counting and/or plate method -Counseled to check feet daily and get yearly eye exams -Counseled on diet and exercise extensively Recommended to continue current medication Recommended taking metformin after eating to minimize GI side effects.  Memory loss (Goal: slow  progression of memory loss) -Controlled -Current treatment  Donepezil 5 mg 1 tablet at bedtime - Appropriate, Effective, Safe, Accessible -Medications previously tried: none  -Recommended to continue current medication  GERD (Goal: minimize symptoms) -Not ideally controlled -Current treatment  Omeprazole 20 mg 1 capsule daily - Appropriate, Effective, Safe, Accessible Apple cider vinegar 600 mg 1 tablet daily - Appropriate, Effective, Safe, Accessible -Medications previously tried: none  -Counseled on non-pharmacologic management of symptoms such as elevating the head of your bed, avoiding eating 2-3 hours before bed, avoiding triggering foods such as acidic, spicy, or fatty foods, eating smaller meals, and wearing clothes that are loose around the waist.   Health Maintenance -Vaccine gaps: COVID booster -Current therapy:  Aspirin 81 mg 1 tablet daily - ran out Zinc 25 mg 1 tablet daily Vitamin D 2000 units 1 capsule daily Cinnamon 1000 mg 1 capsule daily  Garlic 1000 mg 2 capsules daily  Probiotic 1 capsule daily Beet root 1 capsule daily Psyllium packet 1 packet daily Docusate 250 mg 1 capsule twice daily -Educated on Cost vs benefit of each product must be carefully weighed by individual consumer - Patient is not sure all of these supplements are helping. -Recommended cutting back on garlic if unable to find benefit.  Patient Goals/Self-Care Activities Patient will:  - take medications as prescribed as evidenced by patient report and record review check glucose 1-2 times a week, document, and provide at future appointments check blood pressure weekly, document, and provide at future appointments target a minimum of 150 minutes of moderate intensity exercise weekly engage in dietary modifications by increasing fiber intake.  Follow Up Plan: The care management team will reach out to the patient again over the next 7 days.       Mr. Dapolito was given information about  Chronic Care Management services today including:  CCM service includes personalized support from designated clinical staff supervised by his physician, including individualized plan of care and coordination with other care providers 24/7 contact phone numbers for assistance for urgent and routine care needs. Standard insurance, coinsurance, copays and deductibles apply for chronic care management only during months in which we provide at least 20 minutes of these services. Most insurances cover these services at 100%, however patients may be responsible for any copay, coinsurance and/or deductible if applicable. This service may help you avoid the need for more expensive face-to-face services. Only one practitioner may furnish and bill the service in a calendar month. The patient may stop CCM services at any time (effective at the end of the month) by phone call to the office staff.  Patient agreed to services and verbal consent obtained.   Patient verbalizes understanding of instructions and care plan provided today and agrees to view in MyChart. Active MyChart status and patient understanding of how to access instructions and care plan via MyChart confirmed with patient.    The pharmacy team will reach out to the patient again over the next 7 days.   Verner Chol, [facility]

## 2022-05-14 ENCOUNTER — Ambulatory Visit: Payer: Medicare PPO

## 2022-05-14 ENCOUNTER — Telehealth: Payer: Self-pay | Admitting: Pharmacist

## 2022-05-14 ENCOUNTER — Telehealth: Payer: Self-pay | Admitting: Family Medicine

## 2022-05-14 DIAGNOSIS — R2681 Unsteadiness on feet: Secondary | ICD-10-CM | POA: Diagnosis not present

## 2022-05-14 DIAGNOSIS — M6281 Muscle weakness (generalized): Secondary | ICD-10-CM | POA: Diagnosis not present

## 2022-05-14 DIAGNOSIS — R531 Weakness: Secondary | ICD-10-CM | POA: Diagnosis not present

## 2022-05-14 NOTE — Therapy (Signed)
[facility] [address] [place], [place], [address] Phone: [phone]   Fax:  [phone]  Physical Therapy Treatment  Patient Details  Name: Clifford Gibson MRN: [id number] Date of Birth: 1946/09/09 Referring Provider (PT): Caryl Never, MD   Encounter Date: 05/14/2022   PT End of Session - 05/14/22 1119     Visit Number 3    Number of Visits 12    Date for PT Re-Evaluation 07/26/22    PT Start Time 1115    PT Stop Time 1155    PT Time Calculation (min) 40 min    Activity Tolerance Patient tolerated treatment well    Behavior During Therapy [facility] for tasks assessed/performed             Past Medical History:  Diagnosis Date   Acute serous otitis media 02/22/2010   Arthritis    Diabetes mellitus without complication (HCC)    ERECTILE DYSFUNCTION 04/25/2009   GERD 03/24/2009   HYPERLIPIDEMIA 03/28/2010   HYPERTENSION 03/24/2009   OBESITY, MODERATE 04/25/2009   PONV (postoperative nausea and vomiting)     Past Surgical History:  Procedure Laterality Date   COLONOSCOPY N/A 06/21/2015   Procedure: COLONOSCOPY;  Surgeon: Malissa Hippo, MD;  [facility];  Service: Endoscopy;  Laterality: N/A;  930 - moved to 7/20 @ 7:30   NASAL SEPTUM SURGERY  1974   TONSILLECTOMY     3 yrs. old   TOTAL KNEE ARTHROPLASTY  06/11/2012   Procedure: TOTAL KNEE ARTHROPLASTY;  Surgeon: Javier Docker, MD;  Location: [facility];  Service: Orthopedics;  Laterality: Right;    There were no vitals filed for this visit.   Subjective Assessment - 05/14/22 1119     Subjective Pt arrives for today's treatment session denying any pain, but stated he did have pain over the weekend and thinks he may have over done it last week.    Pertinent History R TKA    Limitations Walking;Standing;House hold activities    How long can you stand comfortably? 5-10 minutes    How long can you walk comfortably? 5-10 minutes    Patient Stated Goals improved  strength and mobility, and be able to get up off of the floor    Currently in Pain? No/denies    Pain Onset More than a month ago                               [facility] Adult PT Treatment/Exercise - 05/14/22 0001       Knee/Hip Exercises: Aerobic   Nustep Lvl 4 x 20 mins      Knee/Hip Exercises: Standing   Heel Raises Both;20 reps    Heel Raises Limitations Toe Raises x 20 reps    Knee Flexion Both;20 reps    Hip Abduction Both;20 reps;Knee straight    Hip Extension Both;20 reps;Knee straight      Knee/Hip Exercises: Seated   Long Arc Quad Both;20 reps;Weights    Long Arc Quad Weight 2 lbs.    Clamshell with TheraBand Green   30 reps   Marching Both;20 reps;Weights    Marching Weights 2 lbs.    Hamstring Curl Both;20 reps    Hamstring Limitations green tband                          PT Long Term Goals - 05/07/22 6759  PT LONG TERM GOAL #1   Title Patient will be independent with his HEP.    Time 6    Period Weeks    Status New    Target Date 06/17/22      PT LONG TERM GOAL #2   Title Patient will improve his five time sit to stand to 12 seconds or less for improved safety and lower extremity power.    Time 6    Period Weeks    Status New    Target Date 06/17/22      PT LONG TERM GOAL #3   Title Patient will improve his gait speed to at least 1.0 m/s for improved household and community mobility.    Time 6    Period Weeks    Status New    Target Date 06/17/22      PT LONG TERM GOAL #4   Title Patient will report being able to walk at least 20 minutes without being limited by fatigue or lower extremity pain.    Time 6    Period Weeks    Status New    Target Date 06/17/22                   Plan - 05/14/22 1119     Clinical Impression Statement Pt arrives for today's treatment session denying any pain.  Pt instructed in several standing LE exercises to increase strength and function.  Pt requiring min cues for  proper technique and to avoid compensatory leaning with hip abduction and extension.  Pt also able to tolerate addition of several seated strengthening exercises with min cues for proper technique, posture, and pacing.  Pt denied any pain at the completion of today's treatment session, but does endorse incrased fatigue.    Personal Factors and Comorbidities Comorbidity 2;Time since onset of injury/illness/exacerbation    Comorbidities HTN, DM    Examination-Activity Limitations Locomotion Level;Transfers;Stairs;Stand    Examination-Participation Restrictions Cleaning;Community Activity;Yard Work    Conservation officer, historic buildingstability/Clinical Decision Making Evolving/Moderate complexity    Rehab Potential Good    PT Frequency 2x / week    PT Duration 6 weeks    PT Treatment/Interventions ADLs/Self Care Home Management;Cryotherapy;Electrical Stimulation;Moist Heat;Neuromuscular re-education;Balance training;Therapeutic exercise;Therapeutic activities;Functional mobility training;Stair training;Gait training;Patient/family education;Manual techniques;Vasopneumatic Device    PT Next Visit Plan nustep, lower extremity strengthening, and balance interventions    PT Home Exercise Plan Access Code: [email or web address]  URL: [email or web address]  Date: 05/09/2022  Prepared by: Candi LeashJarrett Barts    Exercises  - Supine Hamstring Stretch with Strap  - 1 x daily - 7 x weekly - 3 sets - 30 seconds  hold  - Seated Hamstring Stretch  - 1 x daily - 7 x weekly - 3 sets - 30 seconds  hold  - Standing Hamstring Stretch with Step  - 1 x daily - 7 x weekly - 3 sets - 30 seconds hold  - Standing March with Counter Support  - 1 x daily - 7 x weekly - 3 sets - 10 reps  - Side Stepping with Counter Support  - 1 x daily - 7 x weekly - 3 sets - 10 reps  - Heel Toe Raises with Counter Support  - 1 x daily - 7 x weekly - 3 sets - 10 reps  - Seated Hip Abduction with Resistance  - 1 x daily - 7 x weekly - 3 sets - 10 reps    Consulted and Agree with Plan of  Care Patient  Patient will benefit from skilled therapeutic intervention in order to improve the following deficits and impairments:  Difficulty walking, Abnormal gait, Decreased activity tolerance, Pain, Decreased balance, Decreased strength, Decreased mobility  Visit Diagnosis: Muscle weakness (generalized)  Unsteadiness on feet     Problem List Patient Active Problem List   Diagnosis Date Noted   Type 2 diabetes mellitus, controlled (HCC) 07/04/2015   Hyperglycemia 02/02/2015   Allergic rhinitis 05/14/2013   Hyperlipemia 03/28/2010   ACUTE SEROUS OTITIS MEDIA 02/22/2010   OBESITY, MODERATE 04/25/2009   ERECTILE DYSFUNCTION 04/25/2009   Essential hypertension 03/24/2009   GERD 03/24/2009   Rationale for Evaluation and Treatment Rehabilitation  Newman Pies, PTA 05/14/2022, 12:00 PM  [facility] [address] [place] [address], [place], [address] Phone: [phone]   Fax:  [phone]  Name: Clifford Gibson MRN: [id number] Date of Birth: [birth year]

## 2022-05-14 NOTE — Telephone Encounter (Signed)
Spoke with patient and wife about results.

## 2022-05-14 NOTE — Telephone Encounter (Signed)
Pt call and stated he is returning your call and want a call back. 

## 2022-05-14 NOTE — Telephone Encounter (Signed)
Called patient for follow up from CCM visit yesterday with starting Repatha and scheduling a repeat lipid panel. Had to leave a voicemail and requested a call back.

## 2022-05-15 NOTE — Telephone Encounter (Signed)
Called patient back for cholesterol medication discussion. Patient verbalized his understanding to go ahead and start taking Repatha injections every 2 weeks. Scheduled lipid panel for 3 months after starting.  Will follow up in 1 month to assess tolerance.  Called pharmacy to have them fill for 3 months supply per patient request. Left a voicemail requesting a call back.

## 2022-05-16 ENCOUNTER — Ambulatory Visit: Payer: Medicare PPO

## 2022-05-16 DIAGNOSIS — R2681 Unsteadiness on feet: Secondary | ICD-10-CM

## 2022-05-16 DIAGNOSIS — M6281 Muscle weakness (generalized): Secondary | ICD-10-CM

## 2022-05-16 DIAGNOSIS — R531 Weakness: Secondary | ICD-10-CM | POA: Diagnosis not present

## 2022-05-16 NOTE — Therapy (Signed)
[facility] [address] [place], [place], [address] Phone: [phone]   Fax:  [phone]  Physical Therapy Treatment  Patient Details  Name: Clifford Gibson MRN: [id number] Date of Birth: 27-Jun-1946 Referring Provider (PT): Caryl Never, MD   Encounter Date: 05/16/2022   PT End of Session - 05/16/22 1128     Visit Number 4    Number of Visits 12    Date for PT Re-Evaluation 07/26/22    PT Start Time 1115    PT Stop Time 1202    PT Time Calculation (min) 47 min    Activity Tolerance Patient tolerated treatment well    Behavior During Therapy [facility] for tasks assessed/performed             Past Medical History:  Diagnosis Date   Acute serous otitis media 02/22/2010   Arthritis    Diabetes mellitus without complication (HCC)    ERECTILE DYSFUNCTION 04/25/2009   GERD 03/24/2009   HYPERLIPIDEMIA 03/28/2010   HYPERTENSION 03/24/2009   OBESITY, MODERATE 04/25/2009   PONV (postoperative nausea and vomiting)     Past Surgical History:  Procedure Laterality Date   COLONOSCOPY N/A 06/21/2015   Procedure: COLONOSCOPY;  Surgeon: Malissa Hippo, MD;  [facility];  Service: Endoscopy;  Laterality: N/A;  930 - moved to 7/20 @ 7:30   NASAL SEPTUM SURGERY  1974   TONSILLECTOMY     3 yrs. old   TOTAL KNEE ARTHROPLASTY  06/11/2012   Procedure: TOTAL KNEE ARTHROPLASTY;  Surgeon: Javier Docker, MD;  [facility];  Service: Orthopedics;  Laterality: Right;    There were no vitals filed for this visit.   Subjective Assessment - 05/16/22 1127     Subjective Pt arrives for today's treatment session denying any pain, states he is feeling "pretty good" today.    Pertinent History R TKA    Limitations Walking;Standing;House hold activities    How long can you stand comfortably? 5-10 minutes    How long can you walk comfortably? 5-10 minutes    Patient Stated Goals improved strength and mobility, and be able to get up off of the  floor    Currently in Pain? No/denies    Pain Onset More than a month ago                               [facility] Adult PT Treatment/Exercise - 05/16/22 0001       Knee/Hip Exercises: Aerobic   Nustep Lvl 5 x 20 mins      Knee/Hip Exercises: Standing   Heel Raises Both;20 reps    Heel Raises Limitations Toe Raises x 20 reps    Knee Flexion Both;20 reps    Knee Flexion Limitations 2#    Hip Abduction Both;20 reps;Knee straight    Abduction Limitations 2#    Hip Extension Both;20 reps;Knee straight    Extension Limitations 2#    Rocker Board 5 minutes      Knee/Hip Exercises: Seated   Long Arc Quad Both;20 reps;Weights    Long Arc Quad Weight 3 lbs.    Hamstring Curl Both;20 reps    Hamstring Limitations green tband                          PT Long Term Goals - 05/07/22 0753       PT LONG TERM GOAL #1  Title Patient will be independent with his HEP.    Time 6    Period Weeks    Status New    Target Date 06/17/22      PT LONG TERM GOAL #2   Title Patient will improve his five time sit to stand to 12 seconds or less for improved safety and lower extremity power.    Time 6    Period Weeks    Status New    Target Date 06/17/22      PT LONG TERM GOAL #3   Title Patient will improve his gait speed to at least 1.0 m/s for improved household and community mobility.    Time 6    Period Weeks    Status New    Target Date 06/17/22      PT LONG TERM GOAL #4   Title Patient will report being able to walk at least 20 minutes without being limited by fatigue or lower extremity pain.    Time 6    Period Weeks    Status New    Target Date 06/17/22                   Plan - 05/16/22 1128     Clinical Impression Statement Pt arrives for today's treatment session denying any pain.  Pt able to tolerate increased resistance with all exercises today.  Pt requiring min cues for proper technique and posture with all standing exercises.   Pt given standing rest breaks as needed due to fatigue.  Pt denies any pain at completion of today's treatment session, but does endorse increased fatigue.    Personal Factors and Comorbidities Comorbidity 2;Time since onset of injury/illness/exacerbation    Comorbidities HTN, DM    Examination-Activity Limitations Locomotion Level;Transfers;Stairs;Stand    Examination-Participation Restrictions Cleaning;Community Activity;Yard Work    Conservation officer, historic buildings Evolving/Moderate complexity    Rehab Potential Good    PT Frequency 2x / week    PT Duration 6 weeks    PT Treatment/Interventions ADLs/Self Care Home Management;Cryotherapy;Electrical Stimulation;Moist Heat;Neuromuscular re-education;Balance training;Therapeutic exercise;Therapeutic activities;Functional mobility training;Stair training;Gait training;Patient/family education;Manual techniques;Vasopneumatic Device    PT Next Visit Plan nustep, lower extremity strengthening, and balance interventions    PT Home Exercise Plan Access Code: [email or web address]  URL: [email or web address]  Date: 05/09/2022  Prepared by: Candi Leash    Exercises  - Supine Hamstring Stretch with Strap  - 1 x daily - 7 x weekly - 3 sets - 30 seconds  hold  - Seated Hamstring Stretch  - 1 x daily - 7 x weekly - 3 sets - 30 seconds  hold  - Standing Hamstring Stretch with Step  - 1 x daily - 7 x weekly - 3 sets - 30 seconds hold  - Standing March with Counter Support  - 1 x daily - 7 x weekly - 3 sets - 10 reps  - Side Stepping with Counter Support  - 1 x daily - 7 x weekly - 3 sets - 10 reps  - Heel Toe Raises with Counter Support  - 1 x daily - 7 x weekly - 3 sets - 10 reps  - Seated Hip Abduction with Resistance  - 1 x daily - 7 x weekly - 3 sets - 10 reps    Consulted and Agree with Plan of Care Patient             Patient will benefit from skilled therapeutic intervention in order to improve the following deficits and  impairments:  Difficulty  walking, Abnormal gait, Decreased activity tolerance, Pain, Decreased balance, Decreased strength, Decreased mobility  Visit Diagnosis: Muscle weakness (generalized)  Unsteadiness on feet     Problem List Patient Active Problem List   Diagnosis Date Noted   Type 2 diabetes mellitus, controlled (HCC) 07/04/2015   Hyperglycemia 02/02/2015   Allergic rhinitis 05/14/2013   Hyperlipemia 03/28/2010   ACUTE SEROUS OTITIS MEDIA 02/22/2010   OBESITY, MODERATE 04/25/2009   ERECTILE DYSFUNCTION 04/25/2009   Essential hypertension 03/24/2009   GERD 03/24/2009   Rationale for Evaluation and Treatment Rehabilitation  Newman Pies, PTA 05/16/2022, 12:11 PM  [facility] [address] [place], [place], [address] Phone: [phone]   Fax:  [phone]  Name: Clifford Gibson MRN: [id number] Date of Birth: [birth year]

## 2022-05-16 NOTE — Telephone Encounter (Signed)
Spoke with [employer]. Repatha 3 month's supply was ordered for the patient and coming in the order today. Pharmacy is aware to call the patient when it is ready for pick up.

## 2022-05-20 ENCOUNTER — Ambulatory Visit: Payer: Medicare PPO

## 2022-05-20 DIAGNOSIS — R2681 Unsteadiness on feet: Secondary | ICD-10-CM

## 2022-05-20 DIAGNOSIS — M6281 Muscle weakness (generalized): Secondary | ICD-10-CM

## 2022-05-20 DIAGNOSIS — R531 Weakness: Secondary | ICD-10-CM | POA: Diagnosis not present

## 2022-05-20 NOTE — Therapy (Signed)
[facility] [address] [place], [place], [address] Phone: [phone]   Fax:  [phone]  Physical Therapy Treatment  Patient Details  Name: CALIPH BOROWIAK MRN: [id number] Date of Birth: 05-04-1946 Referring Provider (PT): Caryl Never, MD   Encounter Date: 05/20/2022   PT End of Session - 05/20/22 1130     Visit Number 5    Number of Visits 12    Date for PT Re-Evaluation 07/26/22    PT Start Time 1115    PT Stop Time 1200    PT Time Calculation (min) 45 min    Activity Tolerance Patient tolerated treatment well    Behavior During Therapy [facility] for tasks assessed/performed             Past Medical History:  Diagnosis Date   Acute serous otitis media 02/22/2010   Arthritis    Diabetes mellitus without complication (HCC)    ERECTILE DYSFUNCTION 04/25/2009   GERD 03/24/2009   HYPERLIPIDEMIA 03/28/2010   HYPERTENSION 03/24/2009   OBESITY, MODERATE 04/25/2009   PONV (postoperative nausea and vomiting)     Past Surgical History:  Procedure Laterality Date   COLONOSCOPY N/A 06/21/2015   Procedure: COLONOSCOPY;  Surgeon: Malissa Hippo, MD;  [facility];  Service: Endoscopy;  Laterality: N/A;  930 - moved to 7/20 @ 7:30   NASAL SEPTUM SURGERY  1974   TONSILLECTOMY     3 yrs. old   TOTAL KNEE ARTHROPLASTY  06/11/2012   Procedure: TOTAL KNEE ARTHROPLASTY;  Surgeon: Javier Docker, MD;  [facility]: [facility];  Service: Orthopedics;  Laterality: Right;    There were no vitals filed for this visit.   Subjective Assessment - 05/20/22 1129     Subjective Patient reports that he is feeling pretty good today. He has not had any problems since his last appointment.    Pertinent History R TKA    Limitations Walking;Standing;House hold activities    How long can you stand comfortably? 5-10 minutes    How long can you walk comfortably? 5-10 minutes    Patient Stated Goals improved strength and mobility, and be able to get up  off of the floor    Currently in Pain? No/denies    Pain Onset More than a month ago                               [facility] Adult PT Treatment/Exercise - 05/20/22 0001       Knee/Hip Exercises: Aerobic   Nustep L4 x 15 minutes      Knee/Hip Exercises: Standing   Knee Flexion Both;2 sets;15 reps    Forward Step Up Both;Hand Hold: 2;Step Height: 6";20 reps    Rocker Board 5 minutes      Knee/Hip Exercises: Seated   Long Arc Quad Both;3 sets;10 reps;Weights    Long Arc Quad Weight 4 lbs.                 Balance Exercises - 05/20/22 0001       Balance Exercises: Standing   Tandem Gait Forward;Upper extremity support;Foam/compliant surface   1 HHA; 2 minutes   Sidestepping Foam/compliant support;Upper extremity support   2 minutes                    PT Long Term Goals - 05/07/22 0753       PT LONG TERM GOAL #1   Title  Patient will be independent with his HEP.    Time 6    Period Weeks    Status New    Target Date 06/17/22      PT LONG TERM GOAL #2   Title Patient will improve his five time sit to stand to 12 seconds or less for improved safety and lower extremity power.    Time 6    Period Weeks    Status New    Target Date 06/17/22      PT LONG TERM GOAL #3   Title Patient will improve his gait speed to at least 1.0 m/s for improved household and community mobility.    Time 6    Period Weeks    Status New    Target Date 06/17/22      PT LONG TERM GOAL #4   Title Patient will report being able to walk at least 20 minutes without being limited by fatigue or lower extremity pain.    Time 6    Period Weeks    Status New    Target Date 06/17/22                   Plan - 05/20/22 1131     Clinical Impression Statement Patient was progressed with sidestepping, tandem walking, and familiar interventions with moderate difficulty and fatigue. He required minimal cueing with today's new interventions for proper exercise  performance to facilitate improved muscular strengthening. He reported feeling tired upon the conclusion of treatment. He continues to require skilled physical therapy to address his remaining impairments to maximize his functional mobility.    Personal Factors and Comorbidities Comorbidity 2;Time since onset of injury/illness/exacerbation    Comorbidities HTN, DM    Examination-Activity Limitations Locomotion Level;Transfers;Stairs;Stand    Examination-Participation Restrictions Cleaning;Community Activity;Yard Work    Conservation officer, historic buildings Evolving/Moderate complexity    Rehab Potential Good    PT Frequency 2x / week    PT Duration 6 weeks    PT Treatment/Interventions ADLs/Self Care Home Management;Cryotherapy;Electrical Stimulation;Moist Heat;Neuromuscular re-education;Balance training;Therapeutic exercise;Therapeutic activities;Functional mobility training;Stair training;Gait training;Patient/family education;Manual techniques;Vasopneumatic Device    PT Next Visit Plan nustep, lower extremity strengthening, and balance interventions    PT Home Exercise Plan Access Code: [email or web address]  URL: [email or web address]  Date: 05/09/2022  Prepared by: Candi Leash    Exercises  - Supine Hamstring Stretch with Strap  - 1 x daily - 7 x weekly - 3 sets - 30 seconds  hold  - Seated Hamstring Stretch  - 1 x daily - 7 x weekly - 3 sets - 30 seconds  hold  - Standing Hamstring Stretch with Step  - 1 x daily - 7 x weekly - 3 sets - 30 seconds hold  - Standing March with Counter Support  - 1 x daily - 7 x weekly - 3 sets - 10 reps  - Side Stepping with Counter Support  - 1 x daily - 7 x weekly - 3 sets - 10 reps  - Heel Toe Raises with Counter Support  - 1 x daily - 7 x weekly - 3 sets - 10 reps  - Seated Hip Abduction with Resistance  - 1 x daily - 7 x weekly - 3 sets - 10 reps    Consulted and Agree with Plan of Care Patient             Patient will benefit from skilled therapeutic  intervention in order to improve the following deficits and impairments:  Difficulty  walking, Abnormal gait, Decreased activity tolerance, Pain, Decreased balance, Decreased strength, Decreased mobility  Visit Diagnosis: Muscle weakness (generalized)  Unsteadiness on feet     Problem List Patient Active Problem List   Diagnosis Date Noted   Type 2 diabetes mellitus, controlled (HCC) 07/04/2015   Hyperglycemia 02/02/2015   Allergic rhinitis 05/14/2013   Hyperlipemia 03/28/2010   ACUTE SEROUS OTITIS MEDIA 02/22/2010   OBESITY, MODERATE 04/25/2009   ERECTILE DYSFUNCTION 04/25/2009   Essential hypertension 03/24/2009   GERD 03/24/2009   Rationale for Evaluation and Treatment Rehabilitation   Granville Lewis, PT 05/20/2022, 12:17 PM  [facility] [address] [place], [place], [address] Phone: [phone]   Fax:  [phone]  Name: JEMARI HALLUM MRN: [id number] Date of Birth: 1946-09-23

## 2022-05-23 ENCOUNTER — Encounter: Payer: Self-pay | Admitting: Physical Therapy

## 2022-05-23 ENCOUNTER — Ambulatory Visit: Payer: Medicare PPO | Admitting: Physical Therapy

## 2022-05-23 DIAGNOSIS — R531 Weakness: Secondary | ICD-10-CM | POA: Diagnosis not present

## 2022-05-23 DIAGNOSIS — M6281 Muscle weakness (generalized): Secondary | ICD-10-CM | POA: Diagnosis not present

## 2022-05-23 DIAGNOSIS — R2681 Unsteadiness on feet: Secondary | ICD-10-CM

## 2022-05-23 NOTE — Therapy (Addendum)
[facility] [address] [place], [place], [address] Phone: [phone]   Fax:  [phone]  Physical Therapy Treatment  Patient Details  Name: Clifford Gibson MRN: [id number] Date of Birth: [birth year] Referring Provider (PT): Caryl Never, MD   Encounter Date: 05/23/2022   PT End of Session - 05/23/22 1033     Visit Number 6    Number of Visits 12    Date for PT Re-Evaluation 07/26/22    PT Start Time 1032    PT Stop Time 1115    PT Time Calculation (min) 43 min    Activity Tolerance Patient tolerated treatment well    Behavior During Therapy [facility] for tasks assessed/performed             Past Medical History:  Diagnosis Date   Acute serous otitis media 02/22/2010   Arthritis    Diabetes mellitus without complication (HCC)    ERECTILE DYSFUNCTION 04/25/2009   GERD 03/24/2009   HYPERLIPIDEMIA 03/28/2010   HYPERTENSION 03/24/2009   OBESITY, MODERATE 04/25/2009   PONV (postoperative nausea and vomiting)     Past Surgical History:  Procedure Laterality Date   COLONOSCOPY N/A 06/21/2015   Procedure: COLONOSCOPY;  Surgeon: Malissa Hippo, MD;  [facility];  Service: Endoscopy;  Laterality: N/A;  930 - moved to 7/20 @ 7:30   NASAL SEPTUM SURGERY  1974   TONSILLECTOMY     3 yrs. old   TOTAL KNEE ARTHROPLASTY  06/11/2012   Procedure: TOTAL KNEE ARTHROPLASTY;  Surgeon: Javier Docker, MD;  [facility];  Service: Orthopedics;  Laterality: Right;    There were no vitals filed for this visit.   Subjective Assessment - 05/23/22 1032     Subjective Patient reports that he is feeling pretty good today.    Pertinent History R TKA    Limitations Walking;Standing;House hold activities    How long can you stand comfortably? 5-10 minutes    How long can you walk comfortably? 5-10 minutes    Patient Stated Goals improved strength and mobility, and be able to get up off of the floor    Currently in Pain? No/denies                 [facility] PT Assessment - 05/23/22 0001       Assessment   Medical Diagnosis Decreased strength    Referring Provider (PT) Caryl Never, MD    Next MD Visit 05/24/22    Prior Therapy None since R TKA (2013)      Precautions   Precautions None                           [facility] Adult PT Treatment/Exercise - 05/23/22 0001       Knee/Hip Exercises: Aerobic   Nustep L3 x19 min      Knee/Hip Exercises: Standing   Heel Raises Both;20 reps    Heel Raises Limitations B toe raise x20 reps    Hip Flexion AROM;Both;Knee bent;20 reps    Hip Abduction AROM;Both;15 reps;Knee straight      Knee/Hip Exercises: Seated   Long Arc Quad Strengthening;Both;3 sets;10 reps;Weights    Long Arc Quad Weight 5 lbs.    Clamshell with Carman Ching   x30 reps   Marching Strengthening;Both;15 reps;Weights    Marching Weights 5 lbs.  PT Long Term Goals - 05/23/22 1113       PT LONG TERM GOAL #1   Title Patient will be independent with his HEP.    Time 6    Period Weeks    Status On-going    Target Date 06/17/22      PT LONG TERM GOAL #2   Title Patient will improve his five time sit to stand to 12 seconds or less for improved safety and lower extremity power.    Time 6    Period Weeks    Status On-going   15 sec   Target Date 06/17/22      PT LONG TERM GOAL #3   Title Patient will improve his gait speed to at least 1.0 m/s for improved household and community mobility.    Time 6    Period Weeks    Status On-going    Target Date 06/17/22      PT LONG TERM GOAL #4   Title Patient will report being able to walk at least 20 minutes without being limited by fatigue or lower extremity pain.    Time 6    Period Weeks    Status On-going    Target Date 06/17/22                   Plan - 05/23/22 1545     Clinical Impression Statement Patient presented in clinic with reports of improvement overall with strength.  Patient does have intermittant difficulty with sit <>stands per patient report but otherwise functionally improved. Patient has improved with pace of sit <> stands but not to goal status. Patient has not attempted prolonged standing at this time.    Personal Factors and Comorbidities Comorbidity 2;Time since onset of injury/illness/exacerbation    Comorbidities HTN, DM    Examination-Activity Limitations Locomotion Level;Transfers;Stairs;Stand    Examination-Participation Restrictions Cleaning;Community Activity;Yard Work    Conservation officer, historic buildings Evolving/Moderate complexity    Rehab Potential Good    PT Frequency 2x / week    PT Duration 6 weeks    PT Treatment/Interventions ADLs/Self Care Home Management;Cryotherapy;Electrical Stimulation;Moist Heat;Neuromuscular re-education;Balance training;Therapeutic exercise;Therapeutic activities;Functional mobility training;Stair training;Gait training;Patient/family education;Manual techniques;Vasopneumatic Device    PT Next Visit Plan nustep, lower extremity strengthening, and balance interventions    PT Home Exercise Plan Access Code: [email or web address]  URL: [email or web address]  Date: 05/09/2022  Prepared by: Candi Leash    Exercises  - Supine Hamstring Stretch with Strap  - 1 x daily - 7 x weekly - 3 sets - 30 seconds  hold  - Seated Hamstring Stretch  - 1 x daily - 7 x weekly - 3 sets - 30 seconds  hold  - Standing Hamstring Stretch with Step  - 1 x daily - 7 x weekly - 3 sets - 30 seconds hold  - Standing March with Counter Support  - 1 x daily - 7 x weekly - 3 sets - 10 reps  - Side Stepping with Counter Support  - 1 x daily - 7 x weekly - 3 sets - 10 reps  - Heel Toe Raises with Counter Support  - 1 x daily - 7 x weekly - 3 sets - 10 reps  - Seated Hip Abduction with Resistance  - 1 x daily - 7 x weekly - 3 sets - 10 reps    Consulted and Agree with Plan of Care Patient             Patient will benefit from  skilled  therapeutic intervention in order to improve the following deficits and impairments:  Difficulty walking, Abnormal gait, Decreased activity tolerance, Pain, Decreased balance, Decreased strength, Decreased mobility  Visit Diagnosis: Muscle weakness (generalized)  Unsteadiness on feet     Problem List Patient Active Problem List   Diagnosis Date Noted   Type 2 diabetes mellitus, controlled (HCC) 07/04/2015   Hyperglycemia 02/02/2015   Allergic rhinitis 05/14/2013   Hyperlipemia 03/28/2010   ACUTE SEROUS OTITIS MEDIA 02/22/2010   OBESITY, MODERATE 04/25/2009   ERECTILE DYSFUNCTION 04/25/2009   Essential hypertension 03/24/2009   GERD 03/24/2009   Rationale for Evaluation and Treatment Rehabilitation   Marvell Fuller, [place] 05/23/2022, 4:01 PM  [facility] [address] [place], [place], [address] Phone: [phone]   Fax:  [phone]  Name: Clifford Gibson MRN: [id number] Date of Birth: February 06, 1946  Progress Note Reporting Period 05/06/22 to 05/23/22  See note below for Objective Data and Assessment of Progress/Goals.   Patient is making good progress with skilled physical therapy as evidenced by his improved function with transfers, subjective reports, and functional strength. He has yet to meet his goals at this time. Recommend that he continue with skilled physical therapy to address his remaining impairments to maximize his safety and functional mobility.   Candi Leash, PT, DPT

## 2022-05-24 ENCOUNTER — Encounter: Payer: Self-pay | Admitting: Family Medicine

## 2022-05-24 ENCOUNTER — Ambulatory Visit: Payer: Medicare PPO | Admitting: Family Medicine

## 2022-05-24 VITALS — BP 130/78 | HR 82 | Temp 98.2°F | Ht 68.0 in | Wt 216.4 lb

## 2022-05-24 DIAGNOSIS — E119 Type 2 diabetes mellitus without complications: Secondary | ICD-10-CM

## 2022-05-24 DIAGNOSIS — R4189 Other symptoms and signs involving cognitive functions and awareness: Secondary | ICD-10-CM | POA: Diagnosis not present

## 2022-05-24 DIAGNOSIS — R9089 Other abnormal findings on diagnostic imaging of central nervous system: Secondary | ICD-10-CM

## 2022-05-24 DIAGNOSIS — E785 Hyperlipidemia, unspecified: Secondary | ICD-10-CM

## 2022-05-24 DIAGNOSIS — I10 Essential (primary) hypertension: Secondary | ICD-10-CM | POA: Diagnosis not present

## 2022-05-24 MED ORDER — DONEPEZIL HCL 10 MG PO TABS: 10.0000 mg | ORAL_TABLET | Freq: Every day | ORAL | 5 refills | Status: DC

## 2022-05-27 ENCOUNTER — Ambulatory Visit: Payer: Medicare PPO | Admitting: *Deleted

## 2022-05-27 DIAGNOSIS — M6281 Muscle weakness (generalized): Secondary | ICD-10-CM | POA: Diagnosis not present

## 2022-05-27 DIAGNOSIS — R2681 Unsteadiness on feet: Secondary | ICD-10-CM | POA: Diagnosis not present

## 2022-05-27 DIAGNOSIS — R531 Weakness: Secondary | ICD-10-CM | POA: Diagnosis not present

## 2022-05-30 ENCOUNTER — Ambulatory Visit: Payer: Medicare PPO

## 2022-05-30 ENCOUNTER — Encounter: Payer: Self-pay | Admitting: Family Medicine

## 2022-05-30 DIAGNOSIS — R2681 Unsteadiness on feet: Secondary | ICD-10-CM | POA: Diagnosis not present

## 2022-05-30 DIAGNOSIS — M6281 Muscle weakness (generalized): Secondary | ICD-10-CM

## 2022-05-30 DIAGNOSIS — R531 Weakness: Secondary | ICD-10-CM | POA: Diagnosis not present

## 2022-05-30 NOTE — Therapy (Signed)
[facility] [address] [place], [place], [address] Phone: [phone]   Fax:  [phone]  Physical Therapy Treatment  Patient Details  Name: Clifford Gibson MRN: [id number] Date of Birth: 02-27-1946 Referring Provider (PT): Caryl Never, MD   Encounter Date: 05/30/2022   PT End of Session - 05/30/22 1030     Visit Number 8    Number of Visits 12    Date for PT Re-Evaluation 07/26/22    PT Start Time 1026    PT Stop Time 1113    PT Time Calculation (min) 47 min    Activity Tolerance Patient tolerated treatment well    Behavior During Therapy [facility] for tasks assessed/performed             Past Medical History:  Diagnosis Date   Acute serous otitis media 02/22/2010   Arthritis    Diabetes mellitus without complication (HCC)    ERECTILE DYSFUNCTION 04/25/2009   GERD 03/24/2009   HYPERLIPIDEMIA 03/28/2010   HYPERTENSION 03/24/2009   OBESITY, MODERATE 04/25/2009   PONV (postoperative nausea and vomiting)     Past Surgical History:  Procedure Laterality Date   COLONOSCOPY N/A 06/21/2015   Procedure: COLONOSCOPY;  Surgeon: Malissa Hippo, MD;  [facility];  Service: Endoscopy;  Laterality: N/A;  930 - moved to 7/20 @ 7:30   NASAL SEPTUM SURGERY  1974   TONSILLECTOMY     3 yrs. old   TOTAL KNEE ARTHROPLASTY  06/11/2012   Procedure: TOTAL KNEE ARTHROPLASTY;  Surgeon: Javier Docker, MD;  [facility];  Service: Orthopedics;  Laterality: Right;    There were no vitals filed for this visit.   Subjective Assessment - 05/30/22 1030     Subjective Patient reports that he feels good today.    Pertinent History R TKA    Limitations Walking;Standing;House hold activities    How long can you stand comfortably? 5-10 minutes    How long can you walk comfortably? 5-10 minutes    Patient Stated Goals improved strength and mobility, and be able to get up off of the floor    Currently in Pain? No/denies                                [facility] Adult PT Treatment/Exercise - 05/30/22 0001       Knee/Hip Exercises: Aerobic   Nustep L4 x16 min      Knee/Hip Exercises: Machines for Strengthening   Cybex Knee Extension 10# x 2 minutes    Cybex Knee Flexion 30# x 2 minutes      Knee/Hip Exercises: Standing   Hip Abduction AROM;Both;Knee straight   2 minutes   Rocker Board 5 minutes      Knee/Hip Exercises: Seated   Clamshell with [employer]   2 minutes                Balance Exercises - 05/30/22 0001       Balance Exercises: Standing   Step Ups Forward;6 inch;UE support 2   onto BOSU (ball up); 3 minutes   Marching Foam/compliant surface;Upper extremity assist 2;Static   2 minutes; BOSU (ball up)                    PT Long Term Goals - 05/23/22 1113       PT LONG TERM GOAL #1   Title Patient will be independent with his  HEP.    Time 6    Period Weeks    Status On-going    Target Date 06/17/22      PT LONG TERM GOAL #2   Title Patient will improve his five time sit to stand to 12 seconds or less for improved safety and lower extremity power.    Time 6    Period Weeks    Status On-going   15 sec   Target Date 06/17/22      PT LONG TERM GOAL #3   Title Patient will improve his gait speed to at least 1.0 m/s for improved household and community mobility.    Time 6    Period Weeks    Status On-going    Target Date 06/17/22      PT LONG TERM GOAL #4   Title Patient will report being able to walk at least 20 minutes without being limited by fatigue or lower extremity pain.    Time 6    Period Weeks    Status On-going    Target Date 06/17/22                   Plan - 05/30/22 1108     Clinical Impression Statement Patient was progressed with multiple familiar interventions for improved lower extremity strength and stability. He required minimal cueing with standing hip abduction to limit trunk mobility and knee flexion to isolate  hip abductor engagement. He reported feeling tired upon the conclusion of treatment. He continues to require skilled physical therapy to address his remaining impairments to maximize his functional strength and mobility.    Personal Factors and Comorbidities Comorbidity 2;Time since onset of injury/illness/exacerbation    Comorbidities HTN, DM    Examination-Activity Limitations Locomotion Level;Transfers;Stairs;Stand    Examination-Participation Restrictions Cleaning;Community Activity;Yard Work    Rehab Potential Good    PT Frequency 2x / week    PT Duration 6 weeks    PT Treatment/Interventions ADLs/Self Care Home Management;Cryotherapy;Electrical Stimulation;Moist Heat;Neuromuscular re-education;Balance training;Therapeutic exercise;Therapeutic activities;Functional mobility training;Stair training;Gait training;Patient/family education;Manual techniques;Vasopneumatic Device    PT Next Visit Plan nustep, lower extremity strengthening, and balance interventions    Consulted and Agree with Plan of Care Patient             Patient will benefit from skilled therapeutic intervention in order to improve the following deficits and impairments:  Difficulty walking, Abnormal gait, Decreased activity tolerance, Pain, Decreased balance, Decreased strength, Decreased mobility  Visit Diagnosis: Muscle weakness (generalized)  Unsteadiness on feet     Problem List Patient Active Problem List   Diagnosis Date Noted   Type 2 diabetes mellitus, controlled (HCC) 07/04/2015   Hyperglycemia 02/02/2015   Allergic rhinitis 05/14/2013   Hyperlipemia 03/28/2010   ACUTE SEROUS OTITIS MEDIA 02/22/2010   OBESITY, MODERATE 04/25/2009   ERECTILE DYSFUNCTION 04/25/2009   Essential hypertension 03/24/2009   GERD 03/24/2009   Rationale for Evaluation and Treatment Rehabilitation   Granville Lewis, PT 05/30/2022, 11:17 AM  [facility] [address] [place], [place], [address] Phone: [phone]   Fax:  [phone]  Name: Clifford Gibson MRN: [id number] Date of Birth: 1946/04/30

## 2022-05-31 DIAGNOSIS — E1159 Type 2 diabetes mellitus with other circulatory complications: Secondary | ICD-10-CM | POA: Diagnosis not present

## 2022-05-31 DIAGNOSIS — I10 Essential (primary) hypertension: Secondary | ICD-10-CM | POA: Diagnosis not present

## 2022-05-31 DIAGNOSIS — E785 Hyperlipidemia, unspecified: Secondary | ICD-10-CM

## 2022-05-31 DIAGNOSIS — Z7984 Long term (current) use of oral hypoglycemic drugs: Secondary | ICD-10-CM

## 2022-06-06 ENCOUNTER — Ambulatory Visit: Payer: Medicare PPO | Attending: Family Medicine | Admitting: Physical Therapy

## 2022-06-06 ENCOUNTER — Encounter: Payer: Self-pay | Admitting: Physical Therapy

## 2022-06-06 DIAGNOSIS — R2681 Unsteadiness on feet: Secondary | ICD-10-CM | POA: Insufficient documentation

## 2022-06-06 DIAGNOSIS — M6281 Muscle weakness (generalized): Secondary | ICD-10-CM | POA: Insufficient documentation

## 2022-06-06 NOTE — Therapy (Signed)
OUTPATIENT PHYSICAL THERAPY TREATMENT NOTE   Patient Name: Clifford Gibson MRN: [id number] DOB:[birth year], 76 y.o., male Today's Date: 06/06/2022   REFERRING PROVIDER: Kristian Covey, MD   PT End of Session - 06/06/22 1026     Visit Number 9    Number of Visits 12    Date for PT Re-Evaluation 07/26/22    PT Start Time 1031    PT Stop Time 1112    PT Time Calculation (min) 41 min    Activity Tolerance Patient tolerated treatment well    Behavior During Therapy [facility] for tasks assessed/performed             Past Medical History:  Diagnosis Date   Acute serous otitis media 02/22/2010   Arthritis    Diabetes mellitus without complication (HCC)    ERECTILE DYSFUNCTION 04/25/2009   GERD 03/24/2009   HYPERLIPIDEMIA 03/28/2010   HYPERTENSION 03/24/2009   OBESITY, MODERATE 04/25/2009   PONV (postoperative nausea and vomiting)    Past Surgical History:  Procedure Laterality Date   COLONOSCOPY N/A 06/21/2015   Procedure: COLONOSCOPY;  Surgeon: Malissa Hippo, MD;  [facility];  Service: Endoscopy;  Laterality: N/A;  930 - moved to 7/20 @ 7:30   NASAL SEPTUM SURGERY  1974   TONSILLECTOMY     3 yrs. old   TOTAL KNEE ARTHROPLASTY  06/11/2012   Procedure: TOTAL KNEE ARTHROPLASTY;  Surgeon: Javier Docker, MD;  Location: [facility];  Service: Orthopedics;  Laterality: Right;   Patient Active Problem List   Diagnosis Date Noted   Type 2 diabetes mellitus, controlled (HCC) 07/04/2015   Hyperglycemia 02/02/2015   Allergic rhinitis 05/14/2013   Hyperlipemia 03/28/2010   ACUTE SEROUS OTITIS MEDIA 02/22/2010   OBESITY, MODERATE 04/25/2009   ERECTILE DYSFUNCTION 04/25/2009   Essential hypertension 03/24/2009   GERD 03/24/2009    REFERRING DIAG: Muscle weakness (generalized), unsteadiness on feet  THERAPY DIAG:  Muscle weakness (generalized)  Unsteadiness on feet  Rationale for Evaluation and Treatment Rehabilitation  PERTINENT HISTORY: R TKR  PRECAUTIONS:  None  SUBJECTIVE: No new complaints.  PAIN:  Are you having pain? No     TODAY'S TREATMENT:                                     EXERCISE LOG  Exercise Repetitions and Resistance Comments  Nustep L5 x15 min   Knee extension 10# 3x10 reps   Knee flexion 40# 3x10 reps   Hip abduction Standing; 3x10 reps BLE   B heel/toe raises  X20 reps each   Sit <> stands X5 reps (12 sec) no UE support    Blank cell = exercise not performed today    PATIENT EDUCATION:   HOME EXERCISE PROGRAM:      PT Long Term Goals - 06/06/22 1027       PT LONG TERM GOAL #1   Title Patient will be independent with his HEP.    Time 6    Period Weeks    Status Achieved   Target Date 06/17/22      PT LONG TERM GOAL #2   Title Patient will improve his five time sit to stand to 12 seconds or less for improved safety and lower extremity power.    Time 6    Period Weeks    Status Achieved   Target Date 06/17/22      PT LONG  TERM GOAL #3   Title Patient will improve his gait speed to at least 1.0 m/s for improved household and community mobility.    Time 6    Period Weeks    Status On-going    Target Date 06/17/22      PT LONG TERM GOAL #4   Title Patient will report being able to walk at least 20 minutes without being limited by fatigue or lower extremity pain.    Time 6    Period Weeks    Status Achieved   Target Date 06/17/22              Plan - 06/06/22 1034     Clinical Impression Statement Patient presented in clinic with reports of no imbalance while doing yardwork over the weekend. Patient states that he feels much stronger and PCP check up went good last visit. Patient able to tolerate all strengthening with no complaint. Able to achieve more LTGs today with sit <> stands.   Personal Factors and Comorbidities Comorbidity 2;Time since onset of injury/illness/exacerbation    Comorbidities HTN, DM    Examination-Activity Limitations Locomotion Level;Transfers;Stairs;Stand     Examination-Participation Restrictions Cleaning;Community Activity;Yard Work    Rehab Potential Good    PT Frequency 2x / week    PT Duration 6 weeks    PT Treatment/Interventions ADLs/Self Care Home Management;Cryotherapy;Electrical Stimulation;Moist Heat;Neuromuscular re-education;Balance training;Therapeutic exercise;Therapeutic activities;Functional mobility training;Stair training;Gait training;Patient/family education;Manual techniques;Vasopneumatic Device    PT Next Visit Plan nustep, lower extremity strengthening, and balance interventions    PT Home Exercise Plan Access Code: [email or web address]  URL: [email or web address]  Date: 05/09/2022  Prepared by: Candi Leash    Exercises  - Supine Hamstring Stretch with Strap  - 1 x daily - 7 x weekly - 3 sets - 30 seconds  hold  - Seated Hamstring Stretch  - 1 x daily - 7 x weekly - 3 sets - 30 seconds  hold  - Standing Hamstring Stretch with Step  - 1 x daily - 7 x weekly - 3 sets - 30 seconds hold  - Standing March with Counter Support  - 1 x daily - 7 x weekly - 3 sets - 10 reps  - Side Stepping with Counter Support  - 1 x daily - 7 x weekly - 3 sets - 10 reps  - Heel Toe Raises with Counter Support  - 1 x daily - 7 x weekly - 3 sets - 10 reps  - Seated Hip Abduction with Resistance  - 1 x daily - 7 x weekly - 3 sets - 10 reps    Consulted and Agree with Plan of Care Patient            Marvell Fuller, PTA 06/06/22 11:13 AM

## 2022-06-06 NOTE — Therapy (Deleted)
[facility] [address] [place], [place], [address] Phone: [phone]   Fax:  [phone]  Physical Therapy Treatment  Patient Details  Name: Clifford Gibson MRN: [id number] Date of Birth: Apr 24, 1946 Referring Provider (PT): Caryl Never, MD   Encounter Date: 06/06/2022   PT End of Session - 06/06/22 1026     Visit Number 9    Number of Visits 12    Date for PT Re-Evaluation 07/26/22    Activity Tolerance Patient tolerated treatment well    Behavior During Therapy [facility] for tasks assessed/performed             Past Medical History:  Diagnosis Date   Acute serous otitis media 02/22/2010   Arthritis    Diabetes mellitus without complication (HCC)    ERECTILE DYSFUNCTION 04/25/2009   GERD 03/24/2009   HYPERLIPIDEMIA 03/28/2010   HYPERTENSION 03/24/2009   OBESITY, MODERATE 04/25/2009   PONV (postoperative nausea and vomiting)     Past Surgical History:  Procedure Laterality Date   COLONOSCOPY N/A 06/21/2015   Procedure: COLONOSCOPY;  Surgeon: Malissa Hippo, MD;  [facility];  Service: Endoscopy;  Laterality: N/A;  930 - moved to 7/20 @ 7:30   NASAL SEPTUM SURGERY  1974   TONSILLECTOMY     3 yrs. old   TOTAL KNEE ARTHROPLASTY  06/11/2012   Procedure: TOTAL KNEE ARTHROPLASTY;  Surgeon: Javier Docker, MD;  Location: [facility];  Service: Orthopedics;  Laterality: Right;    There were no vitals filed for this visit.   Subjective Assessment - 06/06/22 1026     Subjective ***    Pertinent History R TKA    Limitations Walking;Standing;House hold activities    How long can you stand comfortably? 5-10 minutes    How long can you walk comfortably? 5-10 minutes    Patient Stated Goals improved strength and mobility, and be able to get up off of the floor                                               PT Long Term Goals - 06/06/22 1027       PT LONG TERM GOAL #1   Title Patient will  be independent with his HEP.    Time 6    Period Weeks    Status On-going    Target Date 06/17/22      PT LONG TERM GOAL #2   Title Patient will improve his five time sit to stand to 12 seconds or less for improved safety and lower extremity power.    Time 6    Period Weeks    Status On-going   15 sec   Target Date 06/17/22      PT LONG TERM GOAL #3   Title Patient will improve his gait speed to at least 1.0 m/s for improved household and community mobility.    Time 6    Period Weeks    Status On-going    Target Date 06/17/22      PT LONG TERM GOAL #4   Title Patient will report being able to walk at least 20 minutes without being limited by fatigue or lower extremity pain.    Time 6    Period Weeks    Status On-going    Target Date 06/17/22  Patient will benefit from skilled therapeutic intervention in order to improve the following deficits and impairments:     Visit Diagnosis: Muscle weakness (generalized)  Unsteadiness on feet     Problem List Patient Active Problem List   Diagnosis Date Noted   Type 2 diabetes mellitus, controlled (HCC) 07/04/2015   Hyperglycemia 02/02/2015   Allergic rhinitis 05/14/2013   Hyperlipemia 03/28/2010   ACUTE SEROUS OTITIS MEDIA 02/22/2010   OBESITY, MODERATE 04/25/2009   ERECTILE DYSFUNCTION 04/25/2009   Essential hypertension 03/24/2009   GERD 03/24/2009   Rationale for Evaluation and Treatment Rehabilitation   Marvell Fuller, [place] 06/06/2022, 10:27 AM  [facility] [address] [place], [place], [address] Phone: [phone]   Fax:  [phone]  Name: Clifford Gibson MRN: [id number] Date of Birth: 10/21/1946

## 2022-06-10 ENCOUNTER — Ambulatory Visit: Payer: Medicare PPO | Admitting: *Deleted

## 2022-06-10 ENCOUNTER — Encounter: Payer: Self-pay | Admitting: *Deleted

## 2022-06-10 DIAGNOSIS — R2681 Unsteadiness on feet: Secondary | ICD-10-CM

## 2022-06-10 DIAGNOSIS — M6281 Muscle weakness (generalized): Secondary | ICD-10-CM | POA: Diagnosis not present

## 2022-06-10 NOTE — Therapy (Signed)
OUTPATIENT PHYSICAL THERAPY TREATMENT NOTE   Patient Name: Clifford Gibson MRN: [id number] DOB:07/17/1946, 76 y.o., male Today's Date: 06/10/2022   REFERRING PROVIDER: Kristian Covey, MD   PT End of Session - 06/10/22 1040     Visit Number 10    Number of Visits 12    Date for PT Re-Evaluation 07/26/22    PT Start Time 1030    PT Stop Time 1114    PT Time Calculation (min) 44 min             Past Medical History:  Diagnosis Date   Acute serous otitis media 02/22/2010   Arthritis    Diabetes mellitus without complication (HCC)    ERECTILE DYSFUNCTION 04/25/2009   GERD 03/24/2009   HYPERLIPIDEMIA 03/28/2010   HYPERTENSION 03/24/2009   OBESITY, MODERATE 04/25/2009   PONV (postoperative nausea and vomiting)    Past Surgical History:  Procedure Laterality Date   COLONOSCOPY N/A 06/21/2015   Procedure: COLONOSCOPY;  Surgeon: Malissa Hippo, MD;  [facility];  Service: Endoscopy;  Laterality: N/A;  930 - moved to 7/20 @ 7:30   NASAL SEPTUM SURGERY  1974   TONSILLECTOMY     3 yrs. old   TOTAL KNEE ARTHROPLASTY  06/11/2012   Procedure: TOTAL KNEE ARTHROPLASTY;  Surgeon: Javier Docker, MD;  [facility];  Service: Orthopedics;  Laterality: Right;   Patient Active Problem List   Diagnosis Date Noted   Type 2 diabetes mellitus, controlled (HCC) 07/04/2015   Hyperglycemia 02/02/2015   Allergic rhinitis 05/14/2013   Hyperlipemia 03/28/2010   ACUTE SEROUS OTITIS MEDIA 02/22/2010   OBESITY, MODERATE 04/25/2009   ERECTILE DYSFUNCTION 04/25/2009   Essential hypertension 03/24/2009   GERD 03/24/2009    REFERRING DIAG: Muscle weakness (generalized), unsteadiness on feet  THERAPY DIAG:  Muscle weakness (generalized)  Unsteadiness on feet  Rationale for Evaluation and Treatment Rehabilitation  PERTINENT HISTORY: R TKR  PRECAUTIONS: None  SUBJECT:   Did good after last Rx. Able to do some yard work yesterday  PAIN:  Are you having pain?  No     TODAY'S TREATMENT:                                     EXERCISE LOG    06-10-22  Exercise Repetitions and Resistance Comments  Nustep L5 x15 min   Knee extension 10# 5x10 reps   Knee flexion 40# 4x10 reps   Hip abduction Standing; 3x10 reps BLE   B heel/toe raises  X20 reps each   Sit <> stands    Rocker board X   Balance heel-toe holds X 5 with each foot forward    Blank cell = exercise not performed today    PATIENT EDUCATION:   HOME EXERCISE PROGRAM:      PT Long Term Goals - 06/06/22 1027       PT LONG TERM GOAL #1   Title Patient will be independent with his HEP.    Time 6    Period Weeks    Status Achieved   Target Date 06/17/22      PT LONG TERM GOAL #2   Title Patient will improve his five time sit to stand to 12 seconds or less for improved safety and lower extremity power.    Time 6    Period Weeks    Status Achieved   Target Date 06/17/22  PT LONG TERM GOAL #3   Title Patient will improve his gait speed to at least 1.0 m/s for improved household and community mobility.    Time 6    Period Weeks    Status On-going    Target Date 06/17/22      PT LONG TERM GOAL #4   Title Patient will report being able to walk at least 20 minutes without being limited by fatigue or lower extremity pain.    Time 6    Period Weeks    Status Achieved   Target Date 06/17/22              Plan - 06/06/22 1034     Clinical Impression Statement Pt. Arrived today doing fairly well and was able to continue with LE strengthening as well as added balance act.'s and did well.   Personal Factors and Comorbidities Comorbidity 2;Time since onset of injury/illness/exacerbation    Comorbidities HTN, DM    Examination-Activity Limitations Locomotion Level;Transfers;Stairs;Stand    Examination-Participation Restrictions Cleaning;Community Activity;Yard Work    Rehab Potential Good    PT Frequency 2x / week    PT Duration 6 weeks    PT  Treatment/Interventions ADLs/Self Care Home Management;Cryotherapy;Electrical Stimulation;Moist Heat;Neuromuscular re-education;Balance training;Therapeutic exercise;Therapeutic activities;Functional mobility training;Stair training;Gait training;Patient/family education;Manual techniques;Vasopneumatic Device    PT Next Visit Plan nustep, lower extremity strengthening, and balance interventions    PT Home Exercise Plan Access Code: [email or web address]  URL: [email or web address]  Date: 05/09/2022  Prepared by: Candi Leash    Exercises  - Supine Hamstring Stretch with Strap  - 1 x daily - 7 x weekly - 3 sets - 30 seconds  hold  - Seated Hamstring Stretch  - 1 x daily - 7 x weekly - 3 sets - 30 seconds  hold  - Standing Hamstring Stretch with Step  - 1 x daily - 7 x weekly - 3 sets - 30 seconds hold  - Standing March with Counter Support  - 1 x daily - 7 x weekly - 3 sets - 10 reps  - Side Stepping with Counter Support  - 1 x daily - 7 x weekly - 3 sets - 10 reps  - Heel Toe Raises with Counter Support  - 1 x daily - 7 x weekly - 3 sets - 10 reps  - Seated Hip Abduction with Resistance  - 1 x daily - 7 x weekly - 3 sets - 10 reps    Consulted and Agree with Plan of Care Patient            Marvell Fuller, PTA 06/10/22 11:14 AM

## 2022-06-13 ENCOUNTER — Ambulatory Visit: Payer: Medicare PPO | Admitting: *Deleted

## 2022-06-13 ENCOUNTER — Encounter: Payer: Self-pay | Admitting: *Deleted

## 2022-06-13 DIAGNOSIS — R2681 Unsteadiness on feet: Secondary | ICD-10-CM

## 2022-06-13 DIAGNOSIS — M6281 Muscle weakness (generalized): Secondary | ICD-10-CM

## 2022-06-13 NOTE — Therapy (Signed)
OUTPATIENT PHYSICAL THERAPY TREATMENT NOTE   Patient Name: Clifford Gibson MRN: [id number] DOB:Feb 15, 1946, 76 y.o., male Today's Date: 06/13/2022   REFERRING PROVIDER: Kristian Covey, MD   PT End of Session - 06/13/22 1040     Visit Number 11    Number of Visits 12    Date for PT Re-Evaluation 07/26/22    PT Start Time 1030    PT Stop Time 1121    PT Time Calculation (min) 51 min             Past Medical History:  Diagnosis Date   Acute serous otitis media 02/22/2010   Arthritis    Diabetes mellitus without complication (HCC)    ERECTILE DYSFUNCTION 04/25/2009   GERD 03/24/2009   HYPERLIPIDEMIA 03/28/2010   HYPERTENSION 03/24/2009   OBESITY, MODERATE 04/25/2009   PONV (postoperative nausea and vomiting)    Past Surgical History:  Procedure Laterality Date   COLONOSCOPY N/A 06/21/2015   Procedure: COLONOSCOPY;  Surgeon: Malissa Hippo, MD;  [facility];  Service: Endoscopy;  Laterality: N/A;  930 - moved to 7/20 @ 7:30   NASAL SEPTUM SURGERY  1974   TONSILLECTOMY     3 yrs. old   TOTAL KNEE ARTHROPLASTY  06/11/2012   Procedure: TOTAL KNEE ARTHROPLASTY;  Surgeon: Javier Docker, MD;  [facility];  Service: Orthopedics;  Laterality: Right;   Patient Active Problem List   Diagnosis Date Noted   Type 2 diabetes mellitus, controlled (HCC) 07/04/2015   Hyperglycemia 02/02/2015   Allergic rhinitis 05/14/2013   Hyperlipemia 03/28/2010   ACUTE SEROUS OTITIS MEDIA 02/22/2010   OBESITY, MODERATE 04/25/2009   ERECTILE DYSFUNCTION 04/25/2009   Essential hypertension 03/24/2009   GERD 03/24/2009    REFERRING DIAG: Muscle weakness (generalized), unsteadiness on feet  THERAPY DIAG:  Muscle weakness (generalized)  Unsteadiness on feet  Rationale for Evaluation and Treatment Rehabilitation  PERTINENT HISTORY: R TKR  PRECAUTIONS: None  SUBJECT:   Did good after last Rx with some mm soreness.  PAIN:  Are you having pain? No     TODAY'S  TREATMENT:                                     EXERCISE LOG    06-13-22  Exercise Repetitions and Resistance Comments  Nustep L5 x15 min   Knee extension 10# 5x10 reps   Knee flexion 40# 4x10 reps   Hip abduction Standing; 3x10 reps BLE   Marching 3x10   B heel/toe raises  X20 reps each   Sit <> stands    Rocker board X  DF/PF and balance   Balance heel-toe holds X 5 with each foot forward    Blank cell = exercise not performed today    PATIENT EDUCATION:   HOME EXERCISE PROGRAM:      PT Long Term Goals - 06/06/22 1027       PT LONG TERM GOAL #1   Title Patient will be independent with his HEP.    Time 6    Period Weeks    Status Achieved   Target Date 06/17/22      PT LONG TERM GOAL #2   Title Patient will improve his five time sit to stand to 12 seconds or less for improved safety and lower extremity power.    Time 6    Period Weeks    Status Achieved  Target Date 06/17/22      PT LONG TERM GOAL #3   Title Patient will improve his gait speed to at least 1.0 m/s for improved household and community mobility.    Time 6    Period Weeks    Status On-going    Target Date 06/17/22      PT LONG TERM GOAL #4   Title Patient will report being able to walk at least 20 minutes without being limited by fatigue or lower extremity pain.    Time 6    Period Weeks    Status Achieved   Target Date 06/17/22              Plan - 06/13/22 1034     Clinical Impression Statement Pt. Arrived today doing fairly well and was able to continue with LE strengthening with mainly complaints of fatigue end of session.  Recert next visit   Personal Factors and Comorbidities Comorbidity 2;Time since onset of injury/illness/exacerbation    Comorbidities HTN, DM    Examination-Activity Limitations Locomotion Level;Transfers;Stairs;Stand    Examination-Participation Restrictions Cleaning;Community Activity;Yard Work    Rehab Potential Good    PT Frequency 2x / week    PT  Duration 6 weeks    PT Treatment/Interventions ADLs/Self Care Home Management;Cryotherapy;Electrical Stimulation;Moist Heat;Neuromuscular re-education;Balance training;Therapeutic exercise;Therapeutic activities;Functional mobility training;Stair training;Gait training;Patient/family education;Manual techniques;Vasopneumatic Device    PT Next Visit Plan nustep, lower extremity strengthening, and balance interventions    PT Home Exercise Plan Access Code: [email or web address]  URL: [email or web address]  Date: 05/09/2022  Prepared by: Candi Leash    Exercises  - Supine Hamstring Stretch with Strap  - 1 x daily - 7 x weekly - 3 sets - 30 seconds  hold  - Seated Hamstring Stretch  - 1 x daily - 7 x weekly - 3 sets - 30 seconds  hold  - Standing Hamstring Stretch with Step  - 1 x daily - 7 x weekly - 3 sets - 30 seconds hold  - Standing March with Counter Support  - 1 x daily - 7 x weekly - 3 sets - 10 reps  - Side Stepping with Counter Support  - 1 x daily - 7 x weekly - 3 sets - 10 reps  - Heel Toe Raises with Counter Support  - 1 x daily - 7 x weekly - 3 sets - 10 reps  - Seated Hip Abduction with Resistance  - 1 x daily - 7 x weekly - 3 sets - 10 reps    Consulted and Agree with Plan of Care Patient            Gretta Cool, PTA 06/13/22 11:22 AM 06/13/22 11:22 AM

## 2022-06-14 ENCOUNTER — Encounter: Payer: Self-pay | Admitting: Physician Assistant

## 2022-06-14 ENCOUNTER — Ambulatory Visit: Payer: Medicare PPO | Admitting: Physician Assistant

## 2022-06-14 VITALS — BP 165/94 | HR 86 | Resp 18 | Wt 215.0 lb

## 2022-06-14 DIAGNOSIS — R413 Other amnesia: Secondary | ICD-10-CM | POA: Diagnosis not present

## 2022-06-14 DIAGNOSIS — G3184 Mild cognitive impairment, so stated: Secondary | ICD-10-CM | POA: Diagnosis not present

## 2022-06-14 DIAGNOSIS — J3489 Other specified disorders of nose and nasal sinuses: Secondary | ICD-10-CM

## 2022-06-14 NOTE — Progress Notes (Signed)
Assessment/Plan:    The patient is seen in neurologic consultation at the request of Kristian Covey, MD for the evaluation of memory.  Clifford Gibson is a very pleasant 76 y.o. year old RH male with  a history of hypertension, hyperlipidemia, DM2 seen today for evaluation of memory loss. MoCA today is 22/30, with delayed recall 2/5, visuospatial executive 3/5.  Of note, the patient had significant difficulty with calculation.  He also reports difficulty with "thinking of the work, but unable to express it ".  There is concern for mild cognitive impairment and primary progressive aphasia.  He is on Donepezil 10 mg daily by PCP since 04/2022.  MRI of the brain was remarkable for mild ventriculomegaly, mild to moderate age cerebral white matter signal changes consistent with chronic small vessel disease, without any other acute abnormalities.  Neurocognitive testing to further evaluate cognitive concerns and determine other underlying cause of memory changes, including potential contribution from sleep, anxiety, or depression  Referral to ENT for rhinorrhea [place] up in 3 months Continue donepezil 10 mg daily by PCP  Subjective:    The patient is accompanied by his wife and daughter who supplement the history.   How long did patient have memory difficulties?  Patient reports recent mild cognitive changes, although his wife had noticed some changes over the last 3 years "small things, such as forgetting an appointment or a conversation".  Over the last year, this has become worse according to her, long-term memory is good.  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 Patient lives with: Wife Repeats oneself?  Endorsed.  He may ask again ""when is the appt ? Disoriented when walking into a room?  Patient denies   Leaving objects in unusual places?  Patient denies   Ambulates  with difficulty?  Endorsed, due to arthritis. Doing PT/OT knee and hip.  He does  not use a cane. Recent falls?  Patient denies   Any head injuries?  Patient denies   History of seizures?   Patient denies   Wandering behavior?  Patient denies   Patient drives?   Short and long distances   Any mood changes? Patient denies   Any history of depression?:  Patient denies   Hallucinations?  Patient denies   Paranoia?  Patient denies   Patient reports that he sleeps well without vivid dreams, REM behavior or sleepwalking .  Sometimes he has to get up to go to the bathroom or if he has heartburn History of sleep apnea?  Patient denies   Any hygiene concerns?  Patient denies   Independent of bathing and dressing?  Endorsed  Does the patient needs help with medications?  Patient denies   Who is in charge of the finances?  Wife Patient is in charge     Any changes in appetite?  Patient denies   Patient have trouble swallowing? Patient denies   Does the patient cook?  Patient denies   Any kitchen accidents such as leaving the stove on? Patient denies   Any headaches?  Patient denies   Double vision? Patient denies   Any focal numbness or tingling?  Patient denies   Chronic back pain Patient denies   Unilateral weakness?  Patient denies   Any tremors?  Patient denies   Any history of anosmia?  "Never had a good sense of smell " Any incontinence of urine?  Patient denies   Any bowel dysfunction? Sometimes constipation  History of heavy alcohol intake?  Patient denies   History of heavy tobacco use?  Patient denies   Family history of dementia?  Mother developed dementia of unknown type, but she was sick with scleroderma and Raynaud's. Other information "sometimes I have fluid coming out of my right nose, especially during the day, when bending or eating, diet is clear liquid, not mucousy ".  MRI of the brain was remarkable for mild ventriculomegaly, mild to moderate age cerebral white matter signal changes consistent with chronic small vessel disease, without any other acute  abnormalities.  Allergies  Allergen Reactions   Lipitor [Atorvastatin Calcium] Other (See Comments)   Penicillins Other (See Comments)    Reaction=bleeding,diarrhea   Statins Other (See Comments)    Myalgia     Current Outpatient Medications  Medication Instructions   ACCU-CHEK FASTCLIX LANCETS MISC Test once daily Dx E11.9   Apple Cider Vinegar 600 MG CAPS 1 capsule, Oral, Daily   aspirin EC 81 mg, Oral, Daily   Cholecalciferol (VITAMIN D) 50 MCG (2000 UT) CAPS 1 capsule, Oral, Daily   CINNAMON PO 1,000 mg, Oral, Daily   docusate sodium (COLACE) 250 mg, Oral, 2 times daily   donepezil (ARICEPT) 10 mg, Oral, Daily at bedtime   fluticasone (FLONASE) 50 MCG/ACT nasal spray 1 SPRAY NASALLY ONCE A DAY   Garlic 1000 MG CAPS 1 capsule, Oral, Daily   glucose blood (ACCU-CHEK AVIVA) test strip Test once daily. E11.9   metFORMIN (GLUCOPHAGE) 500 MG tablet TAKE 2 TABLETS TWO TIMES DAILY WITH A MEAL   Misc Natural Products (YUMVS BEET ROOT-TART CHERRY) 250-0.5 MG CHEW 1 tablet, Oral, Daily   omeprazole (PRILOSEC) 20 mg, Oral, Daily   Probiotic Product (PROBIOTIC ADVANCED PO) 1 capsule, Oral, Daily   Psyllium 100 % PACK 1 packet, Daily   Repatha SureClick 140 mg, Subcutaneous, Every 14 days   valsartan-hydrochlorothiazide (DIOVAN-HCT) 160-12.5 MG tablet TAKE 1 TABLET AT BEDTIME   Zinc 25 MG TABS 1 tablet, Oral, Daily     VITALS:   Vitals:   06/14/22 0758  BP: (!) 165/94  Pulse: 86  Resp: 18  SpO2: 95%  Weight: 215 lb (97.5 kg)      01/25/2022    2:41 PM 02/15/2021    9:09 AM 11/30/2019    1:48 PM 07/23/2019    8:01 AM 03/10/2018   10:43 AM  Depression screen PHQ 2/9  Decreased Interest 0 0 0 0 0  Down, Depressed, Hopeless 0 0 0 0 0  PHQ - 2 Score 0 0 0 0 0  Altered sleeping    0   Tired, decreased energy    0   Change in appetite    0   Feeling bad or failure about yourself     0   Trouble concentrating    0   Moving slowly or fidgety/restless    0   Suicidal thoughts    0    PHQ-9 Score    0   Difficult doing work/chores    Not difficult at all     PHYSICAL EXAM   HEENT:  Normocephalic, atraumatic. The mucous membranes are moist. The superficial temporal arteries are without ropiness or tenderness. Cardiovascular: Regular rate and rhythm. Lungs: Clear to auscultation bilaterally. Neck: There are no carotid bruits noted bilaterally.  NEUROLOGICAL:    06/14/2022    3:00 PM  Montreal Cognitive Assessment   Visuospatial/ Executive (0/5) 3  Naming (0/3) 3  Attention: Read list of digits (0/2)  2  Attention: Read list of letters (0/1) 1  Attention: Serial 7 subtraction starting at 100 (0/3) 2  Language: Repeat phrase (0/2) 2  Language : Fluency (0/1) 0  Abstraction (0/2) 1  Delayed Recall (0/5) 2  Orientation (0/6) 6  Total 22  Adjusted Score (based on education) 22        No data to display           Orientation:  Alert and oriented to person, place and time. No aphasia or dysarthria. Fund of knowledge is appropriate. Recent memory impaired and remote memory intact.  Attention and concentration are normal.  Able to name objects and repeat phrases. Delayed recall 2/5  Cranial nerves: There is good facial symmetry. Extraocular muscles are intact and visual fields are full to confrontational testing. Speech is fluent and clear. Soft palate rises symmetrically and there is no tongue deviation. Hearing is intact to conversational tone. Tone: Tone is good throughout. Sensation: Sensation is intact to light touch and pinprick throughout. Vibration is intact at the bilateral big toe.There is no extinction with double simultaneous stimulation. There is no sensory dermatomal level identified. Coordination: The patient has no difficulty with RAM's or FNF bilaterally. Normal finger to nose  Motor: Strength is 5/5 in the bilateral upper and lower extremities. There is no pronator drift. There are no fasciculations noted. DTR's: Deep tendon reflexes are 2/4 at  the bilateral biceps, triceps, brachioradialis, patella and achilles.  Plantar responses are downgoing bilaterally. Gait and Station: The patient is able to ambulate without difficulty.The patient is able to heel toe walk without any difficulty.The patient is able to ambulate in a tandem fashion. The patient is able to stand in the Romberg position.     Thank you for allowing [place] the opportunity to participate in the care of this nice patient. Please do not hesitate to contact us for any questions or concerns.   Total time spent on today's visit was 61 minutes dedicated to this patient today, preparing to see patient, examining the patient, ordering tests and/or medications and counseling the patient, documenting clinical information in the EHR or other health record, independently interpreting results and communicating results to the patient/family, discussing treatment and goals, answering patient's questions and coordinating care.  Cc:  Kristian Covey, MD  Marlowe Kays 06/14/2022 3:07 PM

## 2022-06-14 NOTE — Patient Instructions (Addendum)
It was a pleasure to see you today at our office.   Recommendations:  Follow up in  3 months Continue donepezil 10 mg daily  Referral to ENT for rhinorrhea Keep neurocognitive testing   Whom to call:  Memory  decline, memory medications: Call our office [phone]   For psychiatric meds, mood meds: Please have your primary care physician manage these medications.   Counseling regarding caregiver distress, including caregiver depression, anxiety and issues regarding community resources, adult day care programs, adult living facilities, or memory care questions:   Feel free to contact Misty Lisabeth Register, Social Worker at [phone]   For assessment of decision of mental capacity and competency:  Call Dr. Erick Blinks, geriatric psychiatrist at [phone]  For guidance in geriatric dementia issues please call [facility] [phone]  For guidance regarding [facility] and if placement were needed at the facility, contact Sidney Ace, Social Worker tel: [phone]  If you have any severe symptoms of a stroke, or other severe issues such as confusion,severe chills or fever, etc call 911 or go to the ER as you may need to be evaluated further    RECOMMENDATIONS FOR ALL PATIENTS WITH MEMORY PROBLEMS: 1. Continue to exercise (Recommend 30 minutes of walking everyday, or 3 hours every week) 2. Increase social interactions - continue going to [place] and enjoy social gatherings with friends and family 3. Eat healthy, avoid fried foods and eat more fruits and vegetables 4. Maintain adequate blood pressure, blood sugar, and blood cholesterol level. Reducing the risk of stroke and cardiovascular disease also helps promoting better memory. 5. Avoid stressful situations. Live a simple life and avoid aggravations. Organize your time and prepare for the next day in anticipation. 6. Sleep well, avoid any interruptions of sleep and avoid any distractions  in the bedroom that may interfere with adequate sleep quality 7. Avoid sugar, avoid sweets as there is a strong link between excessive sugar intake, diabetes, and cognitive impairment We discussed the Mediterranean diet, which has been shown to help patients reduce the risk of progressive memory disorders and reduces cardiovascular risk. This includes eating fish, eat fruits and green leafy vegetables, nuts like almonds and hazelnuts, walnuts, and also use olive oil. Avoid fast foods and fried foods as much as possible. Avoid sweets and sugar as sugar use has been linked to worsening of memory function.  There is always a concern of gradual progression of memory problems. If this is the case, then we may need to adjust level of care according to patient needs. Support, both to the patient and caregiver, should then be put into place.    FALL PRECAUTIONS: Be cautious when walking. Scan the area for obstacles that may increase the risk of trips and falls. When getting up in the mornings, sit up at the edge of the bed for a few minutes before getting out of bed. Consider elevating the bed at the head end to avoid drop of blood pressure when getting up. Walk always in a well-lit room (use night lights in the walls). Avoid area rugs or power cords from appliances in the middle of the walkways. Use a walker or a cane if necessary and consider physical therapy for balance exercise. Get your eyesight checked regularly.  FINANCIAL OVERSIGHT: Supervision, especially oversight when making financial decisions or transactions is also recommended.  HOME SAFETY: Consider the safety of the kitchen when operating appliances like stoves, microwave oven, and blender. Consider having supervision and share cooking  responsibilities until no longer able to participate in those. Accidents with firearms and other hazards in the house should be identified and addressed as well.   ABILITY TO BE LEFT ALONE: If patient is unable  to contact 911 operator, consider using LifeLine, or when the need is there, arrange for someone to stay with patients. Smoking is a fire hazard, consider supervision or cessation. Risk of wandering should be assessed by caregiver and if detected at any point, supervision and safe proof recommendations should be instituted.  MEDICATION SUPERVISION: Inability to self-administer medication needs to be constantly addressed. Implement a mechanism to ensure safe administration of the medications.   DRIVING: Regarding driving, in patients with progressive memory problems, driving will be impaired. We advise to have someone else do the driving if trouble finding directions or if minor accidents are reported. Independent driving assessment is available to determine safety of driving.   If you are interested in the driving assessment, you can contact the following:  [employer] in [place] [phone]  [facility] [phone]  [facility] [phone] or [phone] We have sent a referral to [facility] for your MRI and they will call you directly to schedule your appointment. They are located at [address] [facility]. If you need to contact them directly please call [phone].

## 2022-06-15 ENCOUNTER — Ambulatory Visit
Admission: RE | Admit: 2022-06-15 | Discharge: 2022-06-15 | Disposition: A | Discharge status: Home / Self Care | Payer: Medicare PPO | Source: Ambulatory Visit | Attending: Physician Assistant | Admitting: Physician Assistant

## 2022-06-15 DIAGNOSIS — R413 Other amnesia: Secondary | ICD-10-CM | POA: Diagnosis not present

## 2022-06-15 DIAGNOSIS — J341 Cyst and mucocele of nose and nasal sinus: Secondary | ICD-10-CM | POA: Diagnosis not present

## 2022-06-15 DIAGNOSIS — G319 Degenerative disease of nervous system, unspecified: Secondary | ICD-10-CM | POA: Diagnosis not present

## 2022-06-15 DIAGNOSIS — J329 Chronic sinusitis, unspecified: Secondary | ICD-10-CM | POA: Diagnosis not present

## 2022-06-17 ENCOUNTER — Ambulatory Visit: Payer: Medicare PPO

## 2022-06-17 DIAGNOSIS — R2681 Unsteadiness on feet: Secondary | ICD-10-CM

## 2022-06-17 DIAGNOSIS — M6281 Muscle weakness (generalized): Secondary | ICD-10-CM | POA: Diagnosis not present

## 2022-06-17 NOTE — Therapy (Signed)
OUTPATIENT PHYSICAL THERAPY TREATMENT NOTE   Patient Name: CLARICE LEER MRN: [id number] DOB:1946-04-15, 76 y.o., male Today's Date: 06/17/2022   REFERRING PROVIDER: Kristian Covey, MD   PT End of Session - 06/17/22 1036     Visit Number 12    Number of Visits 18    Date for PT Re-Evaluation 07/26/22    PT Start Time 1030    PT Stop Time 1110    PT Time Calculation (min) 40 min    Activity Tolerance Patient tolerated treatment well    Behavior During Therapy [facility] for tasks assessed/performed              Past Medical History:  Diagnosis Date   Acute serous otitis media 02/22/2010   Arthritis    Diabetes mellitus without complication (HCC)    ERECTILE DYSFUNCTION 04/25/2009   GERD 03/24/2009   HYPERLIPIDEMIA 03/28/2010   HYPERTENSION 03/24/2009   OBESITY, MODERATE 04/25/2009   PONV (postoperative nausea and vomiting)    Past Surgical History:  Procedure Laterality Date   COLONOSCOPY N/A 06/21/2015   Procedure: COLONOSCOPY;  Surgeon: Malissa Hippo, MD;  Location: [facility];  Service: Endoscopy;  Laterality: N/A;  930 - moved to 7/20 @ 7:30   NASAL SEPTUM SURGERY  1974   TONSILLECTOMY     3 yrs. old   TOTAL KNEE ARTHROPLASTY  06/11/2012   Procedure: TOTAL KNEE ARTHROPLASTY;  Surgeon: Javier Docker, MD;  Location: [facility];  Service: Orthopedics;  Laterality: Right;   Patient Active Problem List   Diagnosis Date Noted   Mild cognitive impairment 06/14/2022   Rhinorrhea 06/14/2022   Type 2 diabetes mellitus, controlled (HCC) 07/04/2015   Hyperglycemia 02/02/2015   Allergic rhinitis 05/14/2013   Hyperlipemia 03/28/2010   ACUTE SEROUS OTITIS MEDIA 02/22/2010   OBESITY, MODERATE 04/25/2009   ERECTILE DYSFUNCTION 04/25/2009   Essential hypertension 03/24/2009   GERD 03/24/2009    REFERRING DIAG: Muscle weakness (generalized), unsteadiness on feet  THERAPY DIAG:  Muscle weakness (generalized)  Unsteadiness on feet  Rationale for Evaluation and  Treatment Rehabilitation  PERTINENT HISTORY: R TKR  PRECAUTIONS: None  SUBJECT:   Patient reports that he was really sore after his last appointment from using the leg machines. He notes that he is still having trouble walking fast and getting up out of a chair. He notes that he had a MRI on Saturday because he noticed that he was having a clear liquid come out of his nose when bending over to lift.   PAIN:  Are you having pain? No     TODAY'S TREATMENT:                                    7/17 EXERCISE LOG  Exercise Repetitions and Resistance Comments  Nustep L5 x 13 minutes    Cybex lumbar flexion  80# x 25 reps   Knee flexion 40# x 25 reps   Knee extension 10# x 25 reps        Blank cell = exercise not performed today                                     EXERCISE LOG    06-13-22  Exercise Repetitions and Resistance Comments  Nustep L5 x15 min   Knee extension 10# 5x10 reps  Knee flexion 40# 4x10 reps   Hip abduction Standing; 3x10 reps BLE   Marching 3x10   B heel/toe raises  X20 reps each   Sit <> stands    Rocker board X  DF/PF and balance   Balance heel-toe holds X 5 with each foot forward    Blank cell = exercise not performed today    PATIENT EDUCATION:   HOME EXERCISE PROGRAM:      PT Long Term Goals - 06/06/22 1027       PT LONG TERM GOAL #1   Title Patient will be independent with his HEP.    Time 6    Period Weeks    Status Achieved   Target Date 06/17/22      PT LONG TERM GOAL #2   Title Patient will improve his five time sit to stand to 12 seconds or less for improved safety and lower extremity power.    Time 6    Period Weeks    Status Achieved   Target Date 06/17/22      PT LONG TERM GOAL #3   Title Patient will improve his gait speed to at least 1.0 m/s for improved household and community mobility.    Time 6    Period Weeks    Baseline  0.75 m/s    Status On-going    Target Date 06/17/22      PT LONG TERM GOAL #4   Title  Patient will report being able to walk at least 20 minutes without being limited by fatigue or lower extremity pain.    Time 6    Period Weeks    Status Achieved   Target Date 06/17/22        PT LONG TERM GOAL #5   Title Patient will improve his TUG time to 12 seconds or less to reduce his fall risk.    Time 3   Period  Weeks   Baseline 15.66 seconds   Status New   Target date 07/08/22       PT LONG TERM GOAL #6   Title Patient will improve his 30 seconds sit to stand to at least 14 reps without upper extremity support for improved lower extremity power and endurance.    Time 3   Period  Weeks   Baseline  9 reps    Status New   Target date 07/08/22             Plan - 06/13/22 1034     Clinical Impression Statement Patient is making good progress with skilled physical therapy, but continues to be an elevated fall risk as evidenced by his TUG and gait speed. He also continues to exhibit reduced lower extremity endurance as elevated by his 30 second sit to stand test. Treatment focused on familiar interventions for improved lower extremity strength and endurance. Recommend that he continue with skilled physical therapy to address his remaining impairments to maximize his safety and functional mobility.   Personal Factors and Comorbidities Comorbidity 2;Time since onset of injury/illness/exacerbation    Comorbidities HTN, DM    Examination-Activity Limitations Locomotion Level;Transfers;Stairs;Stand    Examination-Participation Restrictions Cleaning;Community Activity;Yard Work    Rehab Potential Good    PT Frequency 2x / week    PT Duration 6 weeks    PT Treatment/Interventions ADLs/Self Care Home Management;Cryotherapy;Electrical Stimulation;Moist Heat;Neuromuscular re-education;Balance training;Therapeutic exercise;Therapeutic activities;Functional mobility training;Stair training;Gait training;Patient/family education;Manual techniques;Vasopneumatic Device    PT Next Visit Plan  nustep, lower extremity strengthening, and  balance interventions    PT Home Exercise Plan Access Code: [email or web address]  URL: [email or web address]  Date: 05/09/2022  Prepared by: Candi Leash    Exercises  - Supine Hamstring Stretch with Strap  - 1 x daily - 7 x weekly - 3 sets - 30 seconds  hold  - Seated Hamstring Stretch  - 1 x daily - 7 x weekly - 3 sets - 30 seconds  hold  - Standing Hamstring Stretch with Step  - 1 x daily - 7 x weekly - 3 sets - 30 seconds hold  - Standing March with Counter Support  - 1 x daily - 7 x weekly - 3 sets - 10 reps  - Side Stepping with Counter Support  - 1 x daily - 7 x weekly - 3 sets - 10 reps  - Heel Toe Raises with Counter Support  - 1 x daily - 7 x weekly - 3 sets - 10 reps  - Seated Hip Abduction with Resistance  - 1 x daily - 7 x weekly - 3 sets - 10 reps    Consulted and Agree with Plan of Care Patient            Candi Leash, PT, DPT  06/17/22 3:14 PM

## 2022-06-18 ENCOUNTER — Telehealth: Payer: Self-pay | Admitting: Pharmacist

## 2022-06-18 NOTE — Telephone Encounter (Signed)
Called patient to follow up on Repatha injections and assess tolerance. Patient denies any side effects from the medication and his son is administering the injections. His wife has been marking a calendar when he takes his dose and then when the next dose is due to keep up with getting them in every 2 weeks. Plan to follow up after lab visit in August for results of lipid panel.

## 2022-06-19 NOTE — Progress Notes (Signed)
Please inform patient that there is mild to moderate atrophy in the brain  and some chronic  mild vascular changes  but it is stable since the MRI in 05/2022. There is some small fluid in the sinus with a little retention cyst. Will await for ENT to further check it out thanks

## 2022-06-21 ENCOUNTER — Other Ambulatory Visit: Payer: Self-pay | Admitting: Family Medicine

## 2022-06-21 ENCOUNTER — Ambulatory Visit: Payer: Medicare PPO | Admitting: Physical Therapy

## 2022-06-21 ENCOUNTER — Encounter: Payer: Self-pay | Admitting: Physical Therapy

## 2022-06-21 DIAGNOSIS — I10 Essential (primary) hypertension: Secondary | ICD-10-CM

## 2022-06-21 DIAGNOSIS — R2681 Unsteadiness on feet: Secondary | ICD-10-CM | POA: Diagnosis not present

## 2022-06-21 DIAGNOSIS — M6281 Muscle weakness (generalized): Secondary | ICD-10-CM | POA: Diagnosis not present

## 2022-06-21 NOTE — Therapy (Signed)
OUTPATIENT PHYSICAL THERAPY TREATMENT NOTE   Patient Name: Clifford Gibson MRN: [id number] DOB:08-02-1946, 76 y.o., male Today's Date: 06/21/2022   REFERRING PROVIDER: Eulas Post, MD   PT End of Session - 06/21/22 1033     Visit Number 13    Number of Visits 18    Date for PT Re-Evaluation 07/26/22    PT Start Time 1033    PT Stop Time 1113    PT Time Calculation (min) 40 min    Activity Tolerance Patient tolerated treatment well    Behavior During Therapy [facility] for tasks assessed/performed              Past Medical History:  Diagnosis Date   Acute serous otitis media 02/22/2010   Arthritis    Diabetes mellitus without complication ([place])    ERECTILE DYSFUNCTION 04/25/2009   GERD 03/24/2009   HYPERLIPIDEMIA 03/28/2010   HYPERTENSION 03/24/2009   OBESITY, MODERATE 04/25/2009   PONV (postoperative nausea and vomiting)    Past Surgical History:  Procedure Laterality Date   COLONOSCOPY N/A 06/21/2015   Procedure: COLONOSCOPY;  Surgeon: Rogene Houston, MD;  [facility];  Service: Endoscopy;  Laterality: N/A;  930 - moved to 7/20 @ 7:30   NASAL SEPTUM SURGERY  1974   TONSILLECTOMY     3 yrs. old   TOTAL KNEE ARTHROPLASTY  06/11/2012   Procedure: TOTAL KNEE ARTHROPLASTY;  Surgeon: Johnn Hai, MD;  Location: [facility];  Service: Orthopedics;  Laterality: Right;   Patient Active Problem List   Diagnosis Date Noted   Mild cognitive impairment 06/14/2022   Rhinorrhea 06/14/2022   Type 2 diabetes mellitus, controlled ([place]) 07/04/2015   Hyperglycemia 02/02/2015   Allergic rhinitis 05/14/2013   Hyperlipemia 03/28/2010   ACUTE SEROUS OTITIS MEDIA 02/22/2010   OBESITY, MODERATE 04/25/2009   ERECTILE DYSFUNCTION 04/25/2009   Essential hypertension 03/24/2009   GERD 03/24/2009    REFERRING DIAG: Muscle weakness (generalized), unsteadiness on feet  THERAPY DIAG:  Muscle weakness (generalized)  Unsteadiness on feet  Rationale for Evaluation and  Treatment Rehabilitation  PERTINENT HISTORY: R TKR  PRECAUTIONS: None  SUBJECT:  No new complaints.  PAIN:  Are you having pain? No  TODAY'S TREATMENT:                                    7/17 EXERCISE LOG  Exercise Repetitions and Resistance Comments  Stationary bike L5 x 15 minutes    Cybex lumbar flexion  80# x 25 reps   Knee flexion 30# x 25 reps   Knee extension 10# x 25 reps   Sit to stands X15 reps Attempted goal assessment of 14 reps in 30 sec; Patient stopped at 10 reps due to knee pain within 30 sec  Weightshifting on BOSU X2 min   sidestepping Red x5 RT   Hip marching Red x15 reps   Heel/toe raises On airex x10 reps    Blank cell = exercise not performed today   PATIENT EDUCATION:   HOME EXERCISE PROGRAM:     PT Long Term Goals - 06/21/22 1045      PT LONG TERM GOAL #1   Title Patient will be independent with his HEP.    Time 6    Period Weeks    Status Achieved   Target Date 06/17/22      PT LONG TERM GOAL #2   Title Patient  will improve his five time sit to stand to 12 seconds or less for improved safety and lower extremity power.    Time 6    Period Weeks    Status Achieved   Target Date 06/17/22      PT LONG TERM GOAL #3   Title Patient will improve his gait speed to at least 1.0 m/s for improved household and community mobility.    Time 6    Period Weeks    Baseline  0.75 m/s    Status On-going    Target Date 06/17/22      PT LONG TERM GOAL #4   Title Patient will report being able to walk at least 20 minutes without being limited by fatigue or lower extremity pain.    Time 6    Period Weeks    Status Achieved   Target Date 06/17/22        PT LONG TERM GOAL #5   Title Patient will improve his TUG time to 12 seconds or less to reduce his fall risk.    Time 3   Period  Weeks   Baseline 15.66 seconds   Status On-going   Target date 07/08/22       PT LONG TERM GOAL #6   Title Patient will improve his 30 seconds sit to stand to at  least 14 reps without upper extremity support for improved lower extremity power and endurance.    Time 3   Period  Weeks   Baseline  9 reps    Status On-going   Target date 07/08/22             Plan - 06/21/22 1045     Clinical Impression Statement Patient denies any worries within his home enviroment at this time. Patient only reports soreness from therex strength training.    Personal Factors and Comorbidities Comorbidity 2;Time since onset of injury/illness/exacerbation    Comorbidities HTN, DM    Examination-Activity Limitations Locomotion Level;Transfers;Stairs;Stand    Examination-Participation Restrictions Cleaning;Community Activity;Yard Work    Rehab Potential Good    PT Frequency 2x / week    PT Duration 6 weeks    PT Treatment/Interventions ADLs/Self Care Home Management;Cryotherapy;Electrical Stimulation;Moist Heat;Neuromuscular re-education;Balance training;Therapeutic exercise;Therapeutic activities;Functional mobility training;Stair training;Gait training;Patient/family education;Manual techniques;Vasopneumatic Device    PT Next Visit Plan nustep, lower extremity strengthening, and balance interventions    PT Home Exercise Plan Access Code: [email or web address]  URL: [email or web address]  Date: 05/09/2022  Prepared by: Candi Leash    Exercises  - Supine Hamstring Stretch with Strap  - 1 x daily - 7 x weekly - 3 sets - 30 seconds  hold  - Seated Hamstring Stretch  - 1 x daily - 7 x weekly - 3 sets - 30 seconds  hold  - Standing Hamstring Stretch with Step  - 1 x daily - 7 x weekly - 3 sets - 30 seconds hold  - Standing March with Counter Support  - 1 x daily - 7 x weekly - 3 sets - 10 reps  - Side Stepping with Counter Support  - 1 x daily - 7 x weekly - 3 sets - 10 reps  - Heel Toe Raises with Counter Support  - 1 x daily - 7 x weekly - 3 sets - 10 reps  - Seated Hip Abduction with Resistance  - 1 x daily - 7 x weekly - 3 sets - 10 reps    Consulted and Agree with Plan of  Care Patient  Marvell Fuller, PTA 06/21/22 12:28 PM

## 2022-06-24 ENCOUNTER — Ambulatory Visit: Payer: Medicare PPO

## 2022-06-24 DIAGNOSIS — M6281 Muscle weakness (generalized): Secondary | ICD-10-CM | POA: Diagnosis not present

## 2022-06-24 DIAGNOSIS — R2681 Unsteadiness on feet: Secondary | ICD-10-CM | POA: Diagnosis not present

## 2022-06-24 NOTE — Therapy (Signed)
OUTPATIENT PHYSICAL THERAPY TREATMENT NOTE   Patient Name: Clifford Gibson MRN: [id number] DOB:1946/10/13, 76 y.o., male Today's Date: 06/24/2022   REFERRING PROVIDER: Kristian Covey, MD   PT End of Session - 06/24/22 1029     Visit Number 14    Number of Visits 18    Date for PT Re-Evaluation 07/26/22    PT Start Time 1030    Activity Tolerance Patient tolerated treatment well    Behavior During Therapy [facility] for tasks assessed/performed               Past Medical History:  Diagnosis Date   Acute serous otitis media 02/22/2010   Arthritis    Diabetes mellitus without complication ([facility])    ERECTILE DYSFUNCTION 04/25/2009   GERD 03/24/2009   HYPERLIPIDEMIA 03/28/2010   HYPERTENSION 03/24/2009   OBESITY, MODERATE 04/25/2009   PONV (postoperative nausea and vomiting)    Past Surgical History:  Procedure Laterality Date   COLONOSCOPY N/A 06/21/2015   Procedure: COLONOSCOPY;  Surgeon: Malissa Hippo, MD;  [facility];  Service: Endoscopy;  Laterality: N/A;  930 - moved to 7/20 @ 7:30   NASAL SEPTUM SURGERY  1974   TONSILLECTOMY     3 yrs. old   TOTAL KNEE ARTHROPLASTY  06/11/2012   Procedure: TOTAL KNEE ARTHROPLASTY;  Surgeon: Javier Docker, MD;  Location: [facility];  Service: Orthopedics;  Laterality: Right;   Patient Active Problem List   Diagnosis Date Noted   Mild cognitive impairment 06/14/2022   Rhinorrhea 06/14/2022   Type 2 diabetes mellitus, controlled ([facility]) 07/04/2015   Hyperglycemia 02/02/2015   Allergic rhinitis 05/14/2013   Hyperlipemia 03/28/2010   ACUTE SEROUS OTITIS MEDIA 02/22/2010   OBESITY, MODERATE 04/25/2009   ERECTILE DYSFUNCTION 04/25/2009   Essential hypertension 03/24/2009   GERD 03/24/2009    REFERRING DIAG: Muscle weakness (generalized), unsteadiness on feet  THERAPY DIAG:  Muscle weakness (generalized)  Unsteadiness on feet  Rationale for Evaluation and Treatment Rehabilitation  PERTINENT HISTORY: R  TKR  PRECAUTIONS: None  SUBJECT:  Patient reports that he feels alright today and has not had any problems since his last appointment.   PAIN:  Are you having pain? No  TODAY'S TREATMENT:                                    7/24 EXERCISE LOG  Exercise Repetitions and Resistance Comments  Recumbent bike  L5 x 15 minutes   Knee flexion  40# x 30 reps   Knee extension  20# x 30 reps   Marching  28 reps each Green t-band around Engineering geologist board 3 minutes   Sit to stand  20 reps without UE support   Lateral step up 22 reps each onto 6" step   Seated clams 33 reps w/ green t-band   Tandem stance on foam 3 x 30 seconds each     Blank cell = exercise not performed today                                    7/17 EXERCISE LOG  Exercise Repetitions and Resistance Comments  Stationary bike L5 x 15 minutes    Cybex lumbar flexion  80# x 25 reps   Knee flexion 30# x 25 reps   Knee extension 10# x 25 reps  Sit to stands X15 reps Attempted goal assessment of 14 reps in 30 sec; Patient stopped at 10 reps due to knee pain within 30 sec  Weightshifting on BOSU X2 min   sidestepping Red x5 RT   Hip marching Red x15 reps   Heel/toe raises On airex x10 reps    Blank cell = exercise not performed today   PATIENT EDUCATION:   HOME EXERCISE PROGRAM:     PT Long Term Goals - 06/21/22 1045      PT LONG TERM GOAL #1   Title Patient will be independent with his HEP.    Time 6    Period Weeks    Status Achieved   Target Date 06/17/22      PT LONG TERM GOAL #2   Title Patient will improve his five time sit to stand to 12 seconds or less for improved safety and lower extremity power.    Time 6    Period Weeks    Status Achieved   Target Date 06/17/22      PT LONG TERM GOAL #3   Title Patient will improve his gait speed to at least 1.0 m/s for improved household and community mobility.    Time 6    Period Weeks    Baseline  0.75 m/s    Status On-going    Target Date 06/17/22       PT LONG TERM GOAL #4   Title Patient will report being able to walk at least 20 minutes without being limited by fatigue or lower extremity pain.    Time 6    Period Weeks    Status Achieved   Target Date 06/17/22        PT LONG TERM GOAL #5   Title Patient will improve his TUG time to 12 seconds or less to reduce his fall risk.    Time 3   Period  Weeks   Baseline 15.66 seconds   Status On-going   Target date 07/08/22       PT LONG TERM GOAL #6   Title Patient will improve his 30 seconds sit to stand to at least 14 reps without upper extremity support for improved lower extremity power and endurance.    Time 3   Period  Weeks   Baseline  9 reps    Status On-going   Target date 07/08/22             Plan - 06/21/22 1045     Clinical Impression Statement Treatment focused on familiar interventions for improved endurance and function with his daily activities. He required minimal cueing with today's interventions for proper pacing to maintain proper biomechanics. He reported feeling tired upon the conclusion of treatment. He continues to require skilled physical therapy to address his remaining impairment to maximize his safety and functional mobility.     Personal Factors and Comorbidities Comorbidity 2;Time since onset of injury/illness/exacerbation    Comorbidities HTN, DM    Examination-Activity Limitations Locomotion Level;Transfers;Stairs;Stand    Examination-Participation Restrictions Cleaning;Community Activity;Yard Work    Rehab Potential Good    PT Frequency 2x / week    PT Duration 6 weeks    PT Treatment/Interventions ADLs/Self Care Home Management;Cryotherapy;Electrical Stimulation;Moist Heat;Neuromuscular re-education;Balance training;Therapeutic exercise;Therapeutic activities;Functional mobility training;Stair training;Gait training;Patient/family education;Manual techniques;Vasopneumatic Device    PT Next Visit Plan nustep, lower extremity strengthening, and  balance interventions    PT Home Exercise Plan Access Code: [email or web address]  URL: [email or web address]  Date: 05/09/2022  Prepared by:  Candi Leash    Exercises  - Supine Hamstring Stretch with Strap  - 1 x daily - 7 x weekly - 3 sets - 30 seconds  hold  - Seated Hamstring Stretch  - 1 x daily - 7 x weekly - 3 sets - 30 seconds  hold  - Standing Hamstring Stretch with Step  - 1 x daily - 7 x weekly - 3 sets - 30 seconds hold  - Standing March with Counter Support  - 1 x daily - 7 x weekly - 3 sets - 10 reps  - Side Stepping with Counter Support  - 1 x daily - 7 x weekly - 3 sets - 10 reps  - Heel Toe Raises with Counter Support  - 1 x daily - 7 x weekly - 3 sets - 10 reps  - Seated Hip Abduction with Resistance  - 1 x daily - 7 x weekly - 3 sets - 10 reps    Consulted and Agree with Plan of Care Patient            Candi Leash, PT, DPT  06/24/22 10:43 AM

## 2022-06-27 ENCOUNTER — Ambulatory Visit: Payer: Medicare PPO

## 2022-06-27 DIAGNOSIS — M6281 Muscle weakness (generalized): Secondary | ICD-10-CM | POA: Diagnosis not present

## 2022-06-27 DIAGNOSIS — R2681 Unsteadiness on feet: Secondary | ICD-10-CM

## 2022-06-27 NOTE — Therapy (Signed)
OUTPATIENT PHYSICAL THERAPY TREATMENT NOTE   Patient Name: Clifford Gibson MRN: [id number] DOB:11/28/1946, 76 y.o., male Today's Date: 06/27/2022   REFERRING PROVIDER: Kristian Covey, MD   PT End of Session - 06/27/22 1031     Visit Number 15    Number of Visits 18    Date for PT Re-Evaluation 07/26/22    PT Start Time 1030    PT Stop Time 1113    PT Time Calculation (min) 43 min    Activity Tolerance Patient tolerated treatment well    Behavior During Therapy [facility] for tasks assessed/performed               Past Medical History:  Diagnosis Date   Acute serous otitis media 02/22/2010   Arthritis    Diabetes mellitus without complication ([facility])    ERECTILE DYSFUNCTION 04/25/2009   GERD 03/24/2009   HYPERLIPIDEMIA 03/28/2010   HYPERTENSION 03/24/2009   OBESITY, MODERATE 04/25/2009   PONV (postoperative nausea and vomiting)    Past Surgical History:  Procedure Laterality Date   COLONOSCOPY N/A 06/21/2015   Procedure: COLONOSCOPY;  Surgeon: Malissa Hippo, MD;  [facility];  Service: Endoscopy;  Laterality: N/A;  930 - moved to 7/20 @ 7:30   NASAL SEPTUM SURGERY  1974   TONSILLECTOMY     3 yrs. old   TOTAL KNEE ARTHROPLASTY  06/11/2012   Procedure: TOTAL KNEE ARTHROPLASTY;  Surgeon: Javier Docker, MD;  Location: [facility];  Service: Orthopedics;  Laterality: Right;   Patient Active Problem List   Diagnosis Date Noted   Mild cognitive impairment 06/14/2022   Rhinorrhea 06/14/2022   Type 2 diabetes mellitus, controlled ([facility]) 07/04/2015   Hyperglycemia 02/02/2015   Allergic rhinitis 05/14/2013   Hyperlipemia 03/28/2010   ACUTE SEROUS OTITIS MEDIA 02/22/2010   OBESITY, MODERATE 04/25/2009   ERECTILE DYSFUNCTION 04/25/2009   Essential hypertension 03/24/2009   GERD 03/24/2009    REFERRING DIAG: Muscle weakness (generalized), unsteadiness on feet  THERAPY DIAG:  Muscle weakness (generalized)  Unsteadiness on feet  Rationale for Evaluation and  Treatment Rehabilitation  PERTINENT HISTORY: R TKR  PRECAUTIONS: None  SUBJECT:  Patient reports that he feels alright today and has not had any problems since his last appointment.   PAIN:  Are you having pain? No  TODAY'S TREATMENT:       7/27 EXERCISE LOG  Exercise Repetitions and Resistance Comments  Recumbent bike  L5 x 18 minutes   Knee flexion  40# x 32 reps   Knee extension  20# x 32 reps   Leg Press 2 plates x 21 reps   Marching  28 reps each Green t-band around Engineering geologist board 4 minutes   Sit to stand  22 reps   Forward Step Ups 6 inch step, 23 reps   Lateral step up 23 reps each onto 6" step   Seated clams 35 reps w/ green t-band   Tandem stance on foam     Blank cell = exercise not performed today                                     7/24 EXERCISE LOG  Exercise Repetitions and Resistance Comments  Recumbent bike  L5 x 15 minutes   Knee flexion  40# x 30 reps   Knee extension  20# x 30 reps   Marching  28 reps each Green t-band  around feet  Rocker board 3 minutes   Sit to stand  20 reps without UE support   Lateral step up 22 reps each onto 6" step   Seated clams 33 reps w/ green t-band   Tandem stance on foam 3 x 30 seconds each     Blank cell = exercise not performed today                                     PATIENT EDUCATION:   HOME EXERCISE PROGRAM:     PT Long Term Goals - 06/21/22 1045      PT LONG TERM GOAL #1   Title Patient will be independent with his HEP.    Time 6    Period Weeks    Status Achieved   Target Date 06/17/22      PT LONG TERM GOAL #2   Title Patient will improve his five time sit to stand to 12 seconds or less for improved safety and lower extremity power.    Time 6    Period Weeks    Status Achieved   Target Date 06/17/22      PT LONG TERM GOAL #3   Title Patient will improve his gait speed to at least 1.0 m/s for improved household and community mobility.    Time 6    Period Weeks    Baseline  0.75 m/s     Status On-going    Target Date 06/17/22      PT LONG TERM GOAL #4   Title Patient will report being able to walk at least 20 minutes without being limited by fatigue or lower extremity pain.    Time 6    Period Weeks    Status Achieved   Target Date 06/17/22        PT LONG TERM GOAL #5   Title Patient will improve his TUG time to 12 seconds or less to reduce his fall risk.    Time 3   Period  Weeks   Baseline 15.66 seconds   Status On-going   Target date 07/08/22       PT LONG TERM GOAL #6   Title Patient will improve his 30 seconds sit to stand to at least 14 reps without upper extremity support for improved lower extremity power and endurance.    Time 3   Period  Weeks   Baseline  9 reps    Status On-going   Target date 07/08/22             Plan - 06/21/22 1045     Clinical Impression Statement Pt arrives for today's treatment session denying any pain.  Pt able to tolerate increased reps with all exercises performed today.  Pt reported increased fatigue at completion of today's treatment session, but denies any pain.    Personal Factors and Comorbidities Comorbidity 2;Time since onset of injury/illness/exacerbation    Comorbidities HTN, DM    Examination-Activity Limitations Locomotion Level;Transfers;Stairs;Stand    Examination-Participation Restrictions Cleaning;Community Activity;Yard Work    Rehab Potential Good    PT Frequency 2x / week    PT Duration 6 weeks    PT Treatment/Interventions ADLs/Self Care Home Management;Cryotherapy;Electrical Stimulation;Moist Heat;Neuromuscular re-education;Balance training;Therapeutic exercise;Therapeutic activities;Functional mobility training;Stair training;Gait training;Patient/family education;Manual techniques;Vasopneumatic Device    PT Next Visit Plan nustep, lower extremity strengthening, and balance interventions    PT Home Exercise Plan Access Code: [email or web address]  URL: [email or web address]  Date: 05/09/2022   Prepared by: Candi Leash    Exercises  - Supine Hamstring Stretch with Strap  - 1 x daily - 7 x weekly - 3 sets - 30 seconds  hold  - Seated Hamstring Stretch  - 1 x daily - 7 x weekly - 3 sets - 30 seconds  hold  - Standing Hamstring Stretch with Step  - 1 x daily - 7 x weekly - 3 sets - 30 seconds hold  - Standing March with Counter Support  - 1 x daily - 7 x weekly - 3 sets - 10 reps  - Side Stepping with Counter Support  - 1 x daily - 7 x weekly - 3 sets - 10 reps  - Heel Toe Raises with Counter Support  - 1 x daily - 7 x weekly - 3 sets - 10 reps  - Seated Hip Abduction with Resistance  - 1 x daily - 7 x weekly - 3 sets - 10 reps    Consulted and Agree with Plan of Care Patient            Rexene Agent, PTA 06/27/22 11:17 AM

## 2022-07-01 ENCOUNTER — Ambulatory Visit: Payer: Medicare PPO

## 2022-07-01 DIAGNOSIS — R2681 Unsteadiness on feet: Secondary | ICD-10-CM | POA: Diagnosis not present

## 2022-07-01 DIAGNOSIS — M6281 Muscle weakness (generalized): Secondary | ICD-10-CM | POA: Diagnosis not present

## 2022-07-01 NOTE — Therapy (Signed)
OUTPATIENT PHYSICAL THERAPY TREATMENT NOTE   Patient Name: Clifford Gibson MRN: [id number] DOB:1946-06-27, 76 y.o., male Today's Date: 07/01/2022   REFERRING PROVIDER: Kristian Covey, MD   PT End of Session - 07/01/22 1033     Visit Number 16    Number of Visits 18    Date for PT Re-Evaluation 07/26/22    PT Start Time 1030    PT Stop Time 1115    PT Time Calculation (min) 45 min    Activity Tolerance Patient tolerated treatment well    Behavior During Therapy [facility] for tasks assessed/performed               Past Medical History:  Diagnosis Date   Acute serous otitis media 02/22/2010   Arthritis    Diabetes mellitus without complication (HCC)    ERECTILE DYSFUNCTION 04/25/2009   GERD 03/24/2009   HYPERLIPIDEMIA 03/28/2010   HYPERTENSION 03/24/2009   OBESITY, MODERATE 04/25/2009   PONV (postoperative nausea and vomiting)    Past Surgical History:  Procedure Laterality Date   COLONOSCOPY N/A 06/21/2015   Procedure: COLONOSCOPY;  Surgeon: Malissa Hippo, MD;  [facility];  Service: Endoscopy;  Laterality: N/A;  930 - moved to 7/20 @ 7:30   NASAL SEPTUM SURGERY  1974   TONSILLECTOMY     3 yrs. old   TOTAL KNEE ARTHROPLASTY  06/11/2012   Procedure: TOTAL KNEE ARTHROPLASTY;  Surgeon: Javier Docker, MD;  Location: [facility];  Service: Orthopedics;  Laterality: Right;   Patient Active Problem List   Diagnosis Date Noted   Mild cognitive impairment 06/14/2022   Rhinorrhea 06/14/2022   Type 2 diabetes mellitus, controlled (HCC) 07/04/2015   Hyperglycemia 02/02/2015   Allergic rhinitis 05/14/2013   Hyperlipemia 03/28/2010   ACUTE SEROUS OTITIS MEDIA 02/22/2010   OBESITY, MODERATE 04/25/2009   ERECTILE DYSFUNCTION 04/25/2009   Essential hypertension 03/24/2009   GERD 03/24/2009    REFERRING DIAG: Muscle weakness (generalized), unsteadiness on feet  THERAPY DIAG:  Muscle weakness (generalized)  Unsteadiness on feet  Rationale for Evaluation and  Treatment Rehabilitation  PERTINENT HISTORY: R TKR  PRECAUTIONS: None  SUBJECT:  Patient reports that he feels tired today from the things he did over the weekend, but has no pain.   PAIN:  Are you having pain? No  TODAY'S TREATMENT:    7/31 EXERCISE LOG  Exercise Repetitions and Resistance Comments  Recumbent bike  L5 x 18 minutes   Knee flexion  40# x 35 reps   Knee extension  20# x 35 reps   Leg Press 2 plates x 26 reps   Marching  30 reps each Green t-band around Engineering geologist board 5 minutes   Sit to stand  22 reps   Forward Step Ups    Lateral step up    LAQ 5# x 40 reps each   Seated clams 35 reps w/ green t-band   Tandem stance on foam     Blank cell = exercise not performed today            7/27 EXERCISE LOG  Exercise Repetitions and Resistance Comments  Recumbent bike  L5 x 18 minutes   Knee flexion  40# x 32 reps   Knee extension  20# x 32 reps   Leg Press 2 plates x 21 reps   Marching  28 reps each Green t-band around Engineering geologist board 4 minutes   Sit to stand  22 reps   Forward  Step Ups 6 inch step, 23 reps   Lateral step up 23 reps each onto 6" step   Seated clams 35 reps w/ green t-band   Tandem stance on foam     Blank cell = exercise not performed today                                     PATIENT EDUCATION:   HOME EXERCISE PROGRAM:     PT Long Term Goals - 06/21/22 1045      PT LONG TERM GOAL #1   Title Patient will be independent with his HEP.    Time 6    Period Weeks    Status Achieved   Target Date 06/17/22      PT LONG TERM GOAL #2   Title Patient will improve his five time sit to stand to 12 seconds or less for improved safety and lower extremity power.    Time 6    Period Weeks    Status Achieved   Target Date 06/17/22      PT LONG TERM GOAL #3   Title Patient will improve his gait speed to at least 1.0 m/s for improved household and community mobility.    Time 6    Period Weeks    Baseline  0.75 m/s    Status  On-going    Target Date 06/17/22      PT LONG TERM GOAL #4   Title Patient will report being able to walk at least 20 minutes without being limited by fatigue or lower extremity pain.    Time 6    Period Weeks    Status Achieved   Target Date 06/17/22        PT LONG TERM GOAL #5   Title Patient will improve his TUG time to 12 seconds or less to reduce his fall risk.    Time 3   Period  Weeks   Baseline 15.66 seconds   Status On-going   Target date 07/08/22       PT LONG TERM GOAL #6   Title Patient will improve his 30 seconds sit to stand to at least 14 reps without upper extremity support for improved lower extremity power and endurance.    Time 3   Period  Weeks   Baseline  9 reps    Status On-going   Target date 07/08/22             Plan - 06/21/22 1045     Clinical Impression Statement Pt arrives for today's treatment session denying any pain, but does report fatigue from all the activities he did over the weekend.  Pt reports that he was able to wash his car, mow the yard, and various other housework activities without issue.  Pt able to tolerate increased reps with all exercises today.  Pt denied any pain at completion of today's treatment session.    Personal Factors and Comorbidities Comorbidity 2;Time since onset of injury/illness/exacerbation    Comorbidities HTN, DM    Examination-Activity Limitations Locomotion Level;Transfers;Stairs;Stand    Examination-Participation Restrictions Cleaning;Community Activity;Yard Work    Rehab Potential Good    PT Frequency 2x / week    PT Duration 6 weeks    PT Treatment/Interventions ADLs/Self Care Home Management;Cryotherapy;Electrical Stimulation;Moist Heat;Neuromuscular re-education;Balance training;Therapeutic exercise;Therapeutic activities;Functional mobility training;Stair training;Gait training;Patient/family education;Manual techniques;Vasopneumatic Device    PT Next Visit Plan nustep, lower extremity strengthening,  and  balance interventions    PT Home Exercise Plan Access Code: [email or web address]  URL: [email or web address]  Date: 05/09/2022  Prepared by: Candi Leash    Exercises  - Supine Hamstring Stretch with Strap  - 1 x daily - 7 x weekly - 3 sets - 30 seconds  hold  - Seated Hamstring Stretch  - 1 x daily - 7 x weekly - 3 sets - 30 seconds  hold  - Standing Hamstring Stretch with Step  - 1 x daily - 7 x weekly - 3 sets - 30 seconds hold  - Standing March with Counter Support  - 1 x daily - 7 x weekly - 3 sets - 10 reps  - Side Stepping with Counter Support  - 1 x daily - 7 x weekly - 3 sets - 10 reps  - Heel Toe Raises with Counter Support  - 1 x daily - 7 x weekly - 3 sets - 10 reps  - Seated Hip Abduction with Resistance  - 1 x daily - 7 x weekly - 3 sets - 10 reps    Consulted and Agree with Plan of Care Patient            Rexene Agent, PTA 07/01/22 11:44 AM

## 2022-07-03 ENCOUNTER — Other Ambulatory Visit: Payer: Self-pay | Admitting: Family Medicine

## 2022-07-03 DIAGNOSIS — E119 Type 2 diabetes mellitus without complications: Secondary | ICD-10-CM

## 2022-07-04 ENCOUNTER — Ambulatory Visit: Payer: Medicare PPO | Attending: Family Medicine

## 2022-07-04 DIAGNOSIS — R2681 Unsteadiness on feet: Secondary | ICD-10-CM | POA: Insufficient documentation

## 2022-07-04 DIAGNOSIS — M6281 Muscle weakness (generalized): Secondary | ICD-10-CM | POA: Insufficient documentation

## 2022-07-04 NOTE — Therapy (Signed)
OUTPATIENT PHYSICAL THERAPY TREATMENT NOTE   Patient Name: Clifford Gibson MRN: [id number] DOB:1946-04-26, 76 y.o., male Today's Date: 07/04/2022   REFERRING PROVIDER: Kristian Covey, MD   PT End of Session - 07/04/22 1046     Visit Number 17    Number of Visits 18    Date for PT Re-Evaluation 07/26/22    PT Start Time 1030    PT Stop Time 1115    PT Time Calculation (min) 45 min    Activity Tolerance Patient tolerated treatment well    Behavior During Therapy [facility] for tasks assessed/performed               Past Medical History:  Diagnosis Date   Acute serous otitis media 02/22/2010   Arthritis    Diabetes mellitus without complication (HCC)    ERECTILE DYSFUNCTION 04/25/2009   GERD 03/24/2009   HYPERLIPIDEMIA 03/28/2010   HYPERTENSION 03/24/2009   OBESITY, MODERATE 04/25/2009   PONV (postoperative nausea and vomiting)    Past Surgical History:  Procedure Laterality Date   COLONOSCOPY N/A 06/21/2015   Procedure: COLONOSCOPY;  Surgeon: Malissa Hippo, MD;  [facility];  Service: Endoscopy;  Laterality: N/A;  930 - moved to 7/20 @ 7:30   NASAL SEPTUM SURGERY  1974   TONSILLECTOMY     3 yrs. old   TOTAL KNEE ARTHROPLASTY  06/11/2012   Procedure: TOTAL KNEE ARTHROPLASTY;  Surgeon: Javier Docker, MD;  [facility];  Service: Orthopedics;  Laterality: Right;   Patient Active Problem List   Diagnosis Date Noted   Mild cognitive impairment 06/14/2022   Rhinorrhea 06/14/2022   Type 2 diabetes mellitus, controlled (HCC) 07/04/2015   Hyperglycemia 02/02/2015   Allergic rhinitis 05/14/2013   Hyperlipemia 03/28/2010   ACUTE SEROUS OTITIS MEDIA 02/22/2010   OBESITY, MODERATE 04/25/2009   ERECTILE DYSFUNCTION 04/25/2009   Essential hypertension 03/24/2009   GERD 03/24/2009    REFERRING DIAG: Muscle weakness (generalized), unsteadiness on feet  THERAPY DIAG:  Muscle weakness (generalized)  Unsteadiness on feet  Rationale for Evaluation and  Treatment Rehabilitation  PERTINENT HISTORY: R TKR  PRECAUTIONS: None  SUBJECT:  Patient reports that he feels tired today from the things he did around the house.   PAIN:  Are you having pain? No  TODAY'S TREATMENT:  8/1 EXERCISE LOG  Exercise Repetitions and Resistance Comments  Recumbent bike  L5 x 16 minutes   Knee flexion  50# x 20 reps   Knee extension  30# x 20 reps   Leg Press 3 plates x 20 reps   Marching  2# x 20 reps each   Rocker board 5 minutes   Sit to stand  22 reps   Forward Step Ups    Lateral step up    LAQ 5# x 40 reps each   Seated clams 35 reps w/ green t-band   BOSU Balance x 3 mins (ball side down)    Blank cell = exercise not performed today         7/31 EXERCISE LOG  Exercise Repetitions and Resistance Comments  Recumbent bike  L5 x 18 minutes   Knee flexion  40# x 35 reps   Knee extension  20# x 35 reps   Leg Press 2 plates x 26 reps   Marching  30 reps each Green t-band around Engineering geologist board 5 minutes   Sit to stand  22 reps   Forward Step Ups    Lateral step  up    LAQ 5# x 40 reps each   Seated clams 35 reps w/ green t-band   Tandem stance on foam     Blank cell = exercise not performed today                                        PATIENT EDUCATION:   HOME EXERCISE PROGRAM:     PT Long Term Goals - 06/21/22 1045      PT LONG TERM GOAL #1   Title Patient will be independent with his HEP.    Time 6    Period Weeks    Status Achieved   Target Date 06/17/22      PT LONG TERM GOAL #2   Title Patient will improve his five time sit to stand to 12 seconds or less for improved safety and lower extremity power.    Time 6    Period Weeks    Status Achieved   Target Date 06/17/22      PT LONG TERM GOAL #3   Title Patient will improve his gait speed to at least 1.0 m/s for improved household and community mobility.    Time 6    Period Weeks    Baseline  0.75 m/s    Status On-going    Target Date 06/17/22      PT  LONG TERM GOAL #4   Title Patient will report being able to walk at least 20 minutes without being limited by fatigue or lower extremity pain.    Time 6    Period Weeks    Status Achieved   Target Date 06/17/22        PT LONG TERM GOAL #5   Title Patient will improve his TUG time to 12 seconds or less to reduce his fall risk.    Time 3   Period  Weeks   Baseline 15.66 seconds   Status On-going   Target date 07/08/22       PT LONG TERM GOAL #6   Title Patient will improve his 30 seconds sit to stand to at least 14 reps without upper extremity support for improved lower extremity power and endurance.    Time 3   Period  Weeks   Baseline  9 reps    Status On-going   Target date 07/08/22             Plan - 06/21/22 1045     Clinical Impression Statement Pt arrives for today's treatment session denying any pain.  Pt does report fatigue from performing yard work over the past few days.  Pt able to tolerate increased weight with all cybex exercises today.  Pt introduced to BOSU (ball side down) with which pt demonstrated difficulty and challenged his balance.  Pt reports he is ready for discharge at next treatment session.   Personal Factors and Comorbidities Comorbidity 2;Time since onset of injury/illness/exacerbation    Comorbidities HTN, DM    Examination-Activity Limitations Locomotion Level;Transfers;Stairs;Stand    Examination-Participation Restrictions Cleaning;Community Activity;Yard Work    Rehab Potential Good    PT Frequency 2x / week    PT Duration 6 weeks    PT Treatment/Interventions ADLs/Self Care Home Management;Cryotherapy;Electrical Stimulation;Moist Heat;Neuromuscular re-education;Balance training;Therapeutic exercise;Therapeutic activities;Functional mobility training;Stair training;Gait training;Patient/family education;Manual techniques;Vasopneumatic Device    PT Next Visit Plan nustep, lower extremity strengthening, and balance interventions    PT  Home Exercise  Plan Access Code: [email or web address]  URL: [email or web address]  Date: 05/09/2022  Prepared by: Candi Leash    Exercises  - Supine Hamstring Stretch with Strap  - 1 x daily - 7 x weekly - 3 sets - 30 seconds  hold  - Seated Hamstring Stretch  - 1 x daily - 7 x weekly - 3 sets - 30 seconds  hold  - Standing Hamstring Stretch with Step  - 1 x daily - 7 x weekly - 3 sets - 30 seconds hold  - Standing March with Counter Support  - 1 x daily - 7 x weekly - 3 sets - 10 reps  - Side Stepping with Counter Support  - 1 x daily - 7 x weekly - 3 sets - 10 reps  - Heel Toe Raises with Counter Support  - 1 x daily - 7 x weekly - 3 sets - 10 reps  - Seated Hip Abduction with Resistance  - 1 x daily - 7 x weekly - 3 sets - 10 reps    Consulted and Agree with Plan of Care Patient            Rexene Agent, PTA 07/04/22 11:16 AM

## 2022-07-08 ENCOUNTER — Ambulatory Visit: Payer: Medicare PPO

## 2022-07-08 DIAGNOSIS — M6281 Muscle weakness (generalized): Secondary | ICD-10-CM | POA: Diagnosis not present

## 2022-07-08 DIAGNOSIS — R2681 Unsteadiness on feet: Secondary | ICD-10-CM

## 2022-07-08 NOTE — Therapy (Signed)
OUTPATIENT PHYSICAL THERAPY TREATMENT NOTE   Patient Name: Clifford Gibson MRN: [id number] DOB:[birth year], 76 y.o., male Today's Date: 07/08/2022   REFERRING PROVIDER: Eulas Post, MD   PT End of Session - 07/08/22 1033     Visit Number 18    Number of Visits 18    Date for PT Re-Evaluation 07/26/22    PT Start Time 1031    PT Stop Time 1113    PT Time Calculation (min) 42 min    Activity Tolerance Patient tolerated treatment well    Behavior During Therapy [facility] for tasks assessed/performed               Past Medical History:  Diagnosis Date   Acute serous otitis media 02/22/2010   Arthritis    Diabetes mellitus without complication ([address])    ERECTILE DYSFUNCTION 04/25/2009   GERD 03/24/2009   HYPERLIPIDEMIA 03/28/2010   HYPERTENSION 03/24/2009   OBESITY, MODERATE 04/25/2009   PONV (postoperative nausea and vomiting)    Past Surgical History:  Procedure Laterality Date   COLONOSCOPY N/A 06/21/2015   Procedure: COLONOSCOPY;  Surgeon: Rogene Houston, MD;  [facility];  Service: Endoscopy;  Laterality: N/A;  930 - moved to 7/20 @ 7:30   NASAL SEPTUM SURGERY  1974   TONSILLECTOMY     3 yrs. old   TOTAL KNEE ARTHROPLASTY  06/11/2012   Procedure: TOTAL KNEE ARTHROPLASTY;  Surgeon: Johnn Hai, MD;  Location: [facility];  Service: Orthopedics;  Laterality: Right;   Patient Active Problem List   Diagnosis Date Noted   Mild cognitive impairment 06/14/2022   Rhinorrhea 06/14/2022   Type 2 diabetes mellitus, controlled ([place]) 07/04/2015   Hyperglycemia 02/02/2015   Allergic rhinitis 05/14/2013   Hyperlipemia 03/28/2010   ACUTE SEROUS OTITIS MEDIA 02/22/2010   OBESITY, MODERATE 04/25/2009   ERECTILE DYSFUNCTION 04/25/2009   Essential hypertension 03/24/2009   GERD 03/24/2009    REFERRING DIAG: Muscle weakness (generalized), unsteadiness on feet  THERAPY DIAG:  Muscle weakness (generalized)  Unsteadiness on feet  Rationale for Evaluation and  Treatment Rehabilitation  PERTINENT HISTORY: R TKR  PRECAUTIONS: None  SUBJECT:  Patient reports that he feels good today and feels comfortable being discharged at this time.  PAIN:  Are you having pain? No  TODAY'S TREATMENT:                                    8/7 EXERCISE LOG  Exercise Repetitions and Resistance Comments  Recumbent bike L5 x 16 minutes   Rocker board 5 minutes    Knee flexion  40# x 30 reps    Knee extension 30# x 30 reps   Step up  Onto BOSU; 3 minutes    Blank cell = exercise not performed today   8/1 EXERCISE LOG  Exercise Repetitions and Resistance Comments  Recumbent bike  L5 x 16 minutes   Knee flexion  50# x 20 reps   Knee extension  30# x 20 reps   Leg Press 3 plates x 20 reps   Marching  2# x 20 reps each   Rocker board 5 minutes   Sit to stand  22 reps   Forward Step Ups    Lateral step up    LAQ 5# x 40 reps each   Seated clams 35 reps w/ green t-band   BOSU Balance x 3 mins (ball side down)  Blank cell = exercise not performed today         7/31 EXERCISE LOG  Exercise Repetitions and Resistance Comments  Recumbent bike  L5 x 18 minutes   Knee flexion  40# x 35 reps   Knee extension  20# x 35 reps   Leg Press 2 plates x 26 reps   Marching  30 reps each Green t-band around Investment banker, corporate board 5 minutes   Sit to stand  22 reps   Forward Step Ups    Lateral step up    LAQ 5# x 40 reps each   Seated clams 35 reps w/ green t-band   Tandem stance on foam     Blank cell = exercise not performed today                                        PATIENT EDUCATION: Patient verbalized understanding of his HEP, progress with therapy, and benefits of exercise.    HOME EXERCISE PROGRAM:     PT Long Term Goals - 06/21/22 1045      PT LONG TERM GOAL #1   Title Patient will be independent with his HEP.    Time 6    Period Weeks    Status Achieved   Target Date 06/17/22      PT LONG TERM GOAL #2   Title Patient will improve his  five time sit to stand to 12 seconds or less for improved safety and lower extremity power.    Time 6    Period Weeks    Status Achieved   Target Date 06/17/22      PT LONG TERM GOAL #3   Title Patient will improve his gait speed to at least 1.0 m/s for improved household and community mobility.    Time 6    Period Weeks    Baseline  0.94 m/s    Status Not met   Target Date 06/17/22      PT LONG TERM GOAL #4   Title Patient will report being able to walk at least 20 minutes without being limited by fatigue or lower extremity pain.    Time 6    Period Weeks    Status Achieved   Target Date 06/17/22        PT LONG TERM GOAL #5   Title Patient will improve his TUG time to 12 seconds or less to reduce his fall risk.    Time 3   Period  Weeks   Baseline 9.8 seconds   Status Achieved   Target date 07/08/22       PT LONG TERM GOAL #6   Title Patient will improve his 30 seconds sit to stand to at least 14 reps without upper extremity support for improved lower extremity power and endurance.    Time 3   Period  Weeks   Baseline  11 reps    Status Not met   Target date 07/08/22             Plan - 06/21/22 1045     Clinical Impression Statement Patient was able to meet most of his goals for physical therapy. However, he continues to exhibited reduced lower extremity endurance as evidenced by his 30 second sit to stand test. His HEP was reviewed and he reported feeling comfortable with these interventions. He reported feeling comfortable being discharged at  this time.    Personal Factors and Comorbidities Comorbidity 2;Time since onset of injury/illness/exacerbation    Comorbidities HTN, DM    Examination-Activity Limitations Locomotion Level;Transfers;Stairs;Stand    Examination-Participation Restrictions Cleaning;Community Activity;Yard Work    Rehab Potential Good    PT Frequency 2x / week    PT Duration 6 weeks    PT Treatment/Interventions ADLs/Self Care Home  Management;Cryotherapy;Electrical Stimulation;Moist Heat;Neuromuscular re-education;Balance training;Therapeutic exercise;Therapeutic activities;Functional mobility training;Stair training;Gait training;Patient/family education;Manual techniques;Vasopneumatic Device    PT Next Visit Plan nustep, lower extremity strengthening, and balance interventions    PT Home Exercise Plan Access Code: [email or web address]  URL: [email or web address]  Date: 05/09/2022  Prepared by: Jacqulynn Cadet    Exercises  - Supine Hamstring Stretch with Strap  - 1 x daily - 7 x weekly - 3 sets - 30 seconds  hold  - Seated Hamstring Stretch  - 1 x daily - 7 x weekly - 3 sets - 30 seconds  hold  - Standing Hamstring Stretch with Step  - 1 x daily - 7 x weekly - 3 sets - 30 seconds hold  - Standing March with Counter Support  - 1 x daily - 7 x weekly - 3 sets - 10 reps  - Side Stepping with Counter Support  - 1 x daily - 7 x weekly - 3 sets - 10 reps  - Heel Toe Raises with Counter Support  - 1 x daily - 7 x weekly - 3 sets - 10 reps  - Seated Hip Abduction with Resistance  - 1 x daily - 7 x weekly - 3 sets - 10 reps    Consulted and Agree with Plan of Care Patient           PHYSICAL THERAPY DISCHARGE SUMMARY  Visits from Start of Care: 18  Current functional level related to goals / functional outcomes: See clinical impression statement   Remaining deficits: Lower extremity endurance   Education / Equipment: HEP    Patient agrees to discharge. Patient goals were partially met. Patient is being discharged due to being pleased with the current functional level.   Jacqulynn Cadet, PT, DPT  07/08/22 12:32 PM

## 2022-07-11 ENCOUNTER — Other Ambulatory Visit (INDEPENDENT_AMBULATORY_CARE_PROVIDER_SITE_OTHER): Payer: Medicare PPO

## 2022-07-11 DIAGNOSIS — E785 Hyperlipidemia, unspecified: Secondary | ICD-10-CM

## 2022-07-11 LAB — LIPID PANEL
Cholesterol: 129 mg/dL (ref 0–200)
HDL: 54.6 mg/dL (ref >39.00)
LDL Cholesterol: 40 mg/dL (ref 0–99)
NonHDL: 74.55
Total CHOL/HDL Ratio: 2
Triglycerides: 171 mg/dL — ABNORMAL HIGH (ref 0.0–149.0)
VLDL: 34.2 mg/dL (ref 0.0–40.0)

## 2022-07-16 ENCOUNTER — Encounter: Payer: Self-pay | Admitting: Psychology

## 2022-07-19 ENCOUNTER — Other Ambulatory Visit: Payer: Self-pay | Admitting: Family Medicine

## 2022-07-19 DIAGNOSIS — E119 Type 2 diabetes mellitus without complications: Secondary | ICD-10-CM

## 2022-08-16 ENCOUNTER — Other Ambulatory Visit: Payer: Medicare PPO

## 2022-09-09 DIAGNOSIS — R0982 Postnasal drip: Secondary | ICD-10-CM | POA: Diagnosis not present

## 2022-09-09 DIAGNOSIS — J310 Chronic rhinitis: Secondary | ICD-10-CM | POA: Diagnosis not present

## 2022-09-09 DIAGNOSIS — J343 Hypertrophy of nasal turbinates: Secondary | ICD-10-CM | POA: Diagnosis not present

## 2022-09-09 DIAGNOSIS — H6121 Impacted cerumen, right ear: Secondary | ICD-10-CM | POA: Diagnosis not present

## 2022-09-16 ENCOUNTER — Ambulatory Visit: Payer: Medicare PPO | Admitting: Physician Assistant

## 2022-09-16 ENCOUNTER — Encounter: Payer: Self-pay | Admitting: Physician Assistant

## 2022-09-16 ENCOUNTER — Telehealth: Payer: Self-pay | Admitting: Pharmacist

## 2022-09-16 DIAGNOSIS — G3184 Mild cognitive impairment, so stated: Secondary | ICD-10-CM | POA: Diagnosis not present

## 2022-09-16 NOTE — Patient Instructions (Addendum)
It was a pleasure to see you today at our office.   Recommendations:  Follow up in  3 months Continue donepezil 10 mg daily  Keep neurocognitive testing will move up sooner than scheduled , need a formal diagnosis   Whom to call:  Memory  decline, memory medications: Call our office [phone]   For psychiatric meds, mood meds: Please have your primary care physician manage these medications.   Counseling regarding caregiver distress, including caregiver depression, anxiety and issues regarding community resources, adult day care programs, adult living facilities, or memory care questions:   Feel free to contact Misty Lisabeth Register, Social Worker at [phone]   For assessment of decision of mental capacity and competency:  Call Dr. Erick Blinks, geriatric psychiatrist at [phone]  For guidance in geriatric dementia issues please call [facility] [phone]  For guidance regarding [facility] and if placement were needed at the facility, contact Sidney Ace, Social Worker tel: [phone]  If you have any severe symptoms of a stroke, or other severe issues such as confusion,severe chills or fever, etc call 911 or go to the ER as you may need to be evaluated further    RECOMMENDATIONS FOR ALL PATIENTS WITH MEMORY PROBLEMS: 1. Continue to exercise (Recommend 30 minutes of walking everyday, or 3 hours every week) 2. Increase social interactions - continue going to [place] and enjoy social gatherings with friends and family 3. Eat healthy, avoid fried foods and eat more fruits and vegetables 4. Maintain adequate blood pressure, blood sugar, and blood cholesterol level. Reducing the risk of stroke and cardiovascular disease also helps promoting better memory. 5. Avoid stressful situations. Live a simple life and avoid aggravations. Organize your time and prepare for the next day in anticipation. 6. Sleep well, avoid any interruptions of sleep  and avoid any distractions in the bedroom that may interfere with adequate sleep quality 7. Avoid sugar, avoid sweets as there is a strong link between excessive sugar intake, diabetes, and cognitive impairment We discussed the Mediterranean diet, which has been shown to help patients reduce the risk of progressive memory disorders and reduces cardiovascular risk. This includes eating fish, eat fruits and green leafy vegetables, nuts like almonds and hazelnuts, walnuts, and also use olive oil. Avoid fast foods and fried foods as much as possible. Avoid sweets and sugar as sugar use has been linked to worsening of memory function.  There is always a concern of gradual progression of memory problems. If this is the case, then we may need to adjust level of care according to patient needs. Support, both to the patient and caregiver, should then be put into place.    FALL PRECAUTIONS: Be cautious when walking. Scan the area for obstacles that may increase the risk of trips and falls. When getting up in the mornings, sit up at the edge of the bed for a few minutes before getting out of bed. Consider elevating the bed at the head end to avoid drop of blood pressure when getting up. Walk always in a well-lit room (use night lights in the walls). Avoid area rugs or power cords from appliances in the middle of the walkways. Use a walker or a cane if necessary and consider physical therapy for balance exercise. Get your eyesight checked regularly.  FINANCIAL OVERSIGHT: Supervision, especially oversight when making financial decisions or transactions is also recommended.  HOME SAFETY: Consider the safety of the kitchen when operating appliances like stoves, microwave oven, and blender.  Consider having supervision and share cooking responsibilities until no longer able to participate in those. Accidents with firearms and other hazards in the house should be identified and addressed as well.   ABILITY TO BE LEFT  ALONE: If patient is unable to contact 911 operator, consider using LifeLine, or when the need is there, arrange for someone to stay with patients. Smoking is a fire hazard, consider supervision or cessation. Risk of wandering should be assessed by caregiver and if detected at any point, supervision and safe proof recommendations should be instituted.  MEDICATION SUPERVISION: Inability to self-administer medication needs to be constantly addressed. Implement a mechanism to ensure safe administration of the medications.   DRIVING: Regarding driving, in patients with progressive memory problems, driving will be impaired. We advise to have someone else do the driving if trouble finding directions or if minor accidents are reported. Independent driving assessment is available to determine safety of driving.   If you are interested in the driving assessment, you can contact the following:  [employer] in [place]  [address] [place] [phone]  [facility] [phone] or [phone] We have sent a referral to [facility] for your MRI and they will call you directly to schedule your appointment. They are located at [place]. If you need to contact them directly please call [phone].

## 2022-09-16 NOTE — Progress Notes (Signed)
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 RH male with a history of hypertension, hyperlipidemia, DM2  presenting today in follow-up for evaluation of memory loss. Patient is on donepezil 10 mg daily. 06/17/22 MRI brain personally reviewed was remarkable for mild ventriculomegaly, mild to moderate chronic small vessel disease and mild to moderate generalized cerebral atrophy and mild cerebellar atrophy. He is on donepezil 10 mg daily, tolerating well. Of note, he has been seen by ENT for rhinorrhea, workup is ongoing (r/o CSF leakage)    Recommendations:   Follow up in 6  months. Continue donepezil 10 mg daily as per PCP. Side effects were discussed  Continue plans for Neuropsych testing for clarity of diagnosis and disease trajectory   Recommend good control of cardiovascular risk factors.   Await for ENT results regarding rhinorrhea    Subjective:   This patient is accompanied in the office by his wife  who supplements the history. Previous records as well as any outside records available were reviewed prior to todays visit. Last seen on 06/14/22, at which time his MoCA was 22/30     Any changes in memory since last visit? "The medication may be helping my memory, it is not worse". However, he continues to think of the words or task to do, and needs time to retrieve it". repeats oneself?  Endorsed Disoriented when walking into a room?  Patient denies   Leaving objects in unusual places?  Patient denies   Ambulates  with difficulty?   He finished PT for strength and balance due to deconditioning ans the shuffling of my feet. "I no longer shuffle" Recent falls?  Patient denies   Any head injuries?  Patient denies   History of seizures?   Patient denies   Wandering behavior?  Patient denies   Patient drives? " He may weave, may be sleepier so he takes more breaks"-wife reports ; drives  short and long distances without getting lost.   Any mood changes since last visit?  He is still feeling a "little isolated, less interactive with his peers, attributing it to not wanting to forget a word in front of people, aware of his cognitive changes. Any worsening depression?:  Patient denies   Hallucinations?  Patient denies   Paranoia?  Patient denies   Patient reports that sleeps well without vivid dreams, REM behavior or sleepwalking   History of sleep apnea?  Patient denies   Any hygiene concerns?  Patient denies   Independent of bathing and dressing?  Endorsed  Does the patient needs help with medications?  Denies Who is in charge of the finances? Patient  is in charge    Any changes in appetite?  Patient denies   Patient have trouble swallowing? Patient denies   Does the patient cook?  Patient denies   Any kitchen accidents such as leaving the stove on? Patient denies   Any headaches?  Patient denies   Double vision? Patient denies   Any focal numbness or tingling?  Patient denies   Chronic back pain Patient denies   Unilateral weakness?  Patient denies   Any tremors?  Patient denies   Any history of anosmia?  History off  Any incontinence of urine?  Patient denies   Any bowel dysfunction?   Patient denies      Patient lives with wife     Initial visit on 06/14/22  How  long did patient have memory difficulties?  Patient reports recent mild cognitive changes, although his wife had noticed some changes over the last 3 years "small things, such as forgetting an appointment or a conversation".  Over the last year, this has become worse according to her, long-term memory is good.  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 Patient lives with: Wife Repeats oneself?  Endorsed.  He may ask again ""when is the appt ? Disoriented when walking into a room?  Patient denies   Leaving objects in unusual places?  Patient denies   Ambulates  with  difficulty?  Endorsed, due to arthritis. Doing PT/OT knee and hip.  He does not use a cane. Recent falls?  Patient denies   Any head injuries?  Patient denies   History of seizures?   Patient denies   Wandering behavior?  Patient denies   Patient drives?   Short and long distances   Any mood changes? Patient denies   Any history of depression?:  Patient denies   Hallucinations?  Patient denies   Paranoia?  Patient denies   Patient reports that he sleeps well without vivid dreams, REM behavior or sleepwalking .  Sometimes he has to get up to go to the bathroom or if he has heartburn History of sleep apnea?  Patient denies   Any hygiene concerns?  Patient denies   Independent of bathing and dressing?  Endorsed  Does the patient needs help with medications?  Patient denies   Who is in charge of the finances?  Wife Patient is in charge     Any changes in appetite?  Patient denies   Patient have trouble swallowing? Patient denies   Does the patient cook?  Patient denies   Any kitchen accidents such as leaving the stove on? Patient denies   Any headaches?  Patient denies   Double vision? Patient denies   Any focal numbness or tingling?  Patient denies   Chronic back pain Patient denies   Unilateral weakness?  Patient denies   Any tremors?  Patient denies   Any history of anosmia?  "Never had a good sense of smell " Any incontinence of urine?  Patient denies   Any bowel dysfunction? Sometimes constipation  History of heavy alcohol intake?  Patient denies   History of heavy tobacco use?  Patient denies   Family history of dementia?  Mother developed dementia of unknown type, but she was sick with scleroderma and Raynaud's. Other information "sometimes I have fluid coming out of my right nose, especially during the day, when bending or eating, diet is clear liquid, not mucousy ".   MRI of the brain 06/17/22 was remarkable for mild ventriculomegaly, mild to moderate age cerebral white matter  signal changes consistent with chronic small vessel disease, without any other acute abnormalities.  Past Medical History:  Diagnosis Date   Acute serous otitis media 02/22/2010   Arthritis    Diabetes mellitus without complication (HCC)    ERECTILE DYSFUNCTION 04/25/2009   GERD 03/24/2009   HYPERLIPIDEMIA 03/28/2010   HYPERTENSION 03/24/2009   OBESITY, MODERATE 04/25/2009   PONV (postoperative nausea and vomiting)      Past Surgical History:  Procedure Laterality Date   COLONOSCOPY N/A 06/21/2015   Procedure: COLONOSCOPY;  Surgeon: Malissa Hippo, MD;  Location: [facility];  Service: Endoscopy;  Laterality: N/A;  930 - moved to 7/20 @ 7:30   NASAL SEPTUM SURGERY  1974  TONSILLECTOMY     3 yrs. old   TOTAL KNEE ARTHROPLASTY  06/11/2012   Procedure: TOTAL KNEE ARTHROPLASTY;  Surgeon: Javier Docker, MD;  Location: [facility];  Service: Orthopedics;  Laterality: Right;     PREVIOUS MEDICATIONS:   CURRENT MEDICATIONS:  Outpatient Encounter Medications as of 09/16/2022  Medication Sig   ACCU-CHEK FASTCLIX LANCETS MISC Test once daily Dx E11.9   Apple Cider Vinegar 600 MG CAPS Take 1 capsule by mouth daily.   aspirin EC 81 MG tablet Take 81 mg by mouth daily.   Cholecalciferol (VITAMIN D) 50 MCG (2000 UT) CAPS Take 1 capsule by mouth daily.   CINNAMON PO Take 1,000 mg by mouth daily.   docusate sodium (COLACE) 250 MG capsule Take 250 mg by mouth in the morning and at bedtime.   donepezil (ARICEPT) 10 MG tablet Take 1 tablet (10 mg total) by mouth at bedtime.   Evolocumab (REPATHA SURECLICK) 140 MG/ML SOAJ Inject 140 mg into the skin every 14 (fourteen) days.   fluticasone (FLONASE) 50 MCG/ACT nasal spray 1 SPRAY NASALLY ONCE A DAY   Garlic 1000 MG CAPS Take 1 capsule by mouth daily.   glucose blood (ACCU-CHEK AVIVA) test strip Test once daily. E11.9   metFORMIN (GLUCOPHAGE) 500 MG tablet TAKE 2 TABLETS TWO TIMES DAILY WITH A MEAL   Misc Natural Products (YUMVS BEET ROOT-TART CHERRY)  250-0.5 MG CHEW Chew 1 tablet by mouth daily.   omeprazole (PRILOSEC) 20 MG capsule Take 20 mg by mouth daily.   Probiotic Product (PROBIOTIC ADVANCED PO) Take 1 capsule by mouth daily.   Psyllium 100 % PACK Take 1 packet by mouth daily.   valsartan-hydrochlorothiazide (DIOVAN-HCT) 160-12.5 MG tablet TAKE 1 TABLET AT BEDTIME   Zinc 25 MG TABS Take 1 tablet by mouth daily.   No facility-administered encounter medications on file as of 09/16/2022.     Objective:     PHYSICAL EXAMINATION:    VITALS:  There were no vitals filed for this visit.  GEN:  The patient appears stated age and is in NAD. HEENT:  Normocephalic, atraumatic.   Neurological examination:  General: NAD, well-groomed, appears stated age. Orientation: The patient is alert. Oriented to person, place and date Cranial nerves: There is good facial symmetry.The speech is fluent and clear. No aphasia or dysarthria. Fund of knowledge is appropriate. Recent memory impaired and remote memory is normal.  Attention and concentration are normal.  Able to name objects and repeat phrases.  Hearing is intact to conversational tone.    Sensation: Sensation is intact to light touch throughout Motor: Strength is at least antigravity x4. Tremors: none  DTR's 2/4 in UE/LE      06/14/2022    3:00 PM  Montreal Cognitive Assessment   Visuospatial/ Executive (0/5) 3  Naming (0/3) 3  Attention: Read list of digits (0/2) 2  Attention: Read list of letters (0/1) 1  Attention: Serial 7 subtraction starting at 100 (0/3) 2  Language: Repeat phrase (0/2) 2  Language : Fluency (0/1) 0  Abstraction (0/2) 1  Delayed Recall (0/5) 2  Orientation (0/6) 6  Total 22  Adjusted Score (based on education) 22        No data to display             Movement examination: Tone: There is normal tone in the UE/LE, no Cogwheeling Abnormal movements:  no tremor.  No myoclonus.  No asterixis.   Coordination:  There is no decremation with RAM's.  Normal finger to nose  Gait and Station: The patient has no difficulty arising out of a deep-seated chair without the use of the hands. The patient's stride length is good.  Gait is cautious and narrow.   Thank you for allowing [place] the opportunity to participate in the care of this nice patient. Please do not hesitate to contact us for any questions or concerns.   Total time spent on today's visit was 31 minutes dedicated to this patient today, preparing to see patient, examining the patient, ordering tests and/or medications and counseling the patient, documenting clinical information in the EHR or other health record, independently interpreting results and communicating results to the patient/family, discussing treatment and goals, answering patient's questions and coordinating care.  Cc:  Kristian Covey, MD  Marlowe Kays 09/16/2022 6:19 PM

## 2022-09-16 NOTE — Chronic Care Management (AMB) (Unsigned)
    Chronic Care Management Pharmacy Assistant   Name: Clifford Gibson  MRN: [id number] DOB: September 25, 1946  Reason for Encounter: Follow up blood pressure readings.  Recent Office Vitals: BP Readings from Last 3 Encounters:  06/14/22 (!) 165/94  05/24/22 130/78  05/13/22 (!) 142/80   How often are you checking your Blood Pressure? {CHL HP BP Monitoring Frequency:[phone]}  Current home BP readings: *** SCHED FU IN DEC Care Gaps: AWV - scheduled 01/20/2022 Last BP - 165/94 on 06/14/2022 Covid - overdue Foot exam - overdue Flu - due  Star Rating Drugs: Metformin 500 mg - last filled 07/03/2022 90 DS at [facility]. Diovan HCT 160/12.5 mg - last filled 06/21/2022 90 DS at [facility].  SIG***

## 2022-10-07 ENCOUNTER — Other Ambulatory Visit: Payer: Self-pay | Admitting: Family Medicine

## 2022-10-07 DIAGNOSIS — I10 Essential (primary) hypertension: Secondary | ICD-10-CM

## 2022-10-08 ENCOUNTER — Encounter: Payer: Medicare PPO | Admitting: Psychology

## 2022-10-15 ENCOUNTER — Encounter: Payer: Medicare PPO | Admitting: Psychology

## 2022-10-19 ENCOUNTER — Other Ambulatory Visit: Payer: Self-pay | Admitting: Family Medicine

## 2022-11-04 ENCOUNTER — Encounter: Payer: Medicare PPO | Admitting: Psychology

## 2022-11-07 DIAGNOSIS — J343 Hypertrophy of nasal turbinates: Secondary | ICD-10-CM | POA: Diagnosis not present

## 2022-11-07 DIAGNOSIS — R42 Dizziness and giddiness: Secondary | ICD-10-CM | POA: Diagnosis not present

## 2022-11-07 DIAGNOSIS — J310 Chronic rhinitis: Secondary | ICD-10-CM | POA: Diagnosis not present

## 2022-11-07 DIAGNOSIS — H903 Sensorineural hearing loss, bilateral: Secondary | ICD-10-CM | POA: Diagnosis not present

## 2022-11-07 DIAGNOSIS — R0982 Postnasal drip: Secondary | ICD-10-CM | POA: Diagnosis not present

## 2022-11-14 ENCOUNTER — Other Ambulatory Visit: Payer: Self-pay | Admitting: Family Medicine

## 2022-11-14 DIAGNOSIS — E119 Type 2 diabetes mellitus without complications: Secondary | ICD-10-CM

## 2022-11-18 ENCOUNTER — Telehealth: Payer: Self-pay | Admitting: Pharmacist

## 2022-11-18 NOTE — Chronic Care Management (AMB) (Signed)
    Chronic Care Management Pharmacy Assistant   Name: Clifford Gibson  MRN: [id number] DOB: [birth year]  11/19/2022 APPOINTMENT REMINDER  Clifford Gibson was reminded to have all medications, supplements and any blood glucose and blood pressure readings available for review with Clifford Gibson, Pharm. D, at his telephone visit on 11/19/2022 at 11:45.  Care Gaps: AWV - scheduled 01/20/2023 Last BP - 165/94 on 06/14/2022 Last A1C - 6.3 on 04/16/2022 Foot exam - overdue Covid - overdue HGA1C - overdue  Star Rating Drug: Metformin 500 mg - last filled 11/15/2022 30 DS at [facility]. Diovan HCT 160/12.5 mg - last filled 10/08/2022 90 DS at [facility].  Any gaps in medications fill history? No  Inetta Fermo [facility]  Programme researcher, broadcasting/film/video [phone]

## 2022-11-18 NOTE — Progress Notes (Unsigned)
Chronic Care Management Pharmacy Note  11/19/2022 Name:  Clifford Gibson MRN:  [id number] DOB:  1946-09-06  Summary: LDL at goal < 70 A1c at goal < 7% Pt still complains of GERD symptoms  Recommendations/Changes made from today's visit: -Recommended pantoprazole to replace esomeprazole -Recommended taking metformin after eating to minimize GI side effects -Recommended weekly monitoring of BGs at home  Plan: Scheduled PCP follow up for repeat A1c GERD assessment in 3-4 weeks  Subjective: Clifford Gibson is an 76 y.o. year old male who is a primary patient of Burchette, Alinda Sierras, MD.  The CCM team was consulted for assistance with disease management and care coordination needs.    Engaged with patient by telephone for follow up visit in response to provider referral for pharmacy case management and/or care coordination services.   Consent to Services:  The patient was given information about Chronic Care Management services, agreed to services, and gave verbal consent prior to initiation of services.  Please see initial visit note for detailed documentation.   Patient Care Team: Eulas Post, MD as PCP - General Viona Gilmore, [facility] as Pharmacist (Pharmacist)  Recent office visits: 05/24/2022 [name] MD: Patient was seen for cognitive impairment. Referred to neurology.   Recent consult visits: 09/16/22 Sharene Butters, PA-C (neurology): Patient presented for mild cognitive impairment. Follow up in 3 months.  07/08/22 Jacqulynn Cadet, PT (outpatient rehab): Patient presented for PT treatment for muscle weakness and last visit as patient was discharged from PT.  06/14/22 Sharene Butters, PA-C (neurology): Patient presented for mild cognitive impairment to establish care. Referred to ENT for rhinorrhea.  05/09/22 Jacqulynn Cadet, PT (outpatient rehab): Patient presented for PT treatment for muscle weakness.  05/06/2022 Jacqulynn Cadet PT (OP Rehab) - Patient was seen for  Muscle weakness and Unsteadiness on feet. No medication changes. Follow up 2 times per week for 6 weeks.    11/16/2021 Stevan Born FNP ([facility]) - Patient was seen for positive home coved test. Started Paxlovid. No follow up noted.   Hospital visits: None in previous 6 months   Objective:  Lab Results  Component Value Date   CREATININE 1.41 04/16/2022   BUN 28 (H) 04/16/2022   GFR 48.13 (L) 04/16/2022   GFRNONAA 84 (L) 06/12/2012   GFRAA >90 06/12/2012   NA 139 04/16/2022   K 4.4 04/16/2022   CALCIUM 10.0 04/16/2022   CO2 28 04/16/2022   GLUCOSE 88 04/16/2022    Lab Results  Component Value Date/Time   HGBA1C 6.3 04/16/2022 03:31 PM   HGBA1C 6.5 02/19/2021 09:09 AM   HGBA1C 6.6 (H) 04/25/2020 08:54 AM   GFR 48.13 (L) 04/16/2022 03:31 PM   GFR 61.32 02/19/2021 09:40 AM   MICROALBUR 1.0 04/22/2022 03:48 PM    Last diabetic Eye exam:  Lab Results  Component Value Date/Time   HMDIABEYEEXA No Retinopathy 05/02/2022 12:00 AM    Last diabetic Foot exam:  Lab Results  Component Value Date/Time   HMDIABFOOTEX normal 02/21/2016 12:00 AM     Lab Results  Component Value Date   CHOL 129 07/11/2022   HDL 54.60 07/11/2022   LDLCALC 40 07/11/2022   LDLDIRECT 172.0 04/16/2022   TRIG 171.0 (H) 07/11/2022   CHOLHDL 2 07/11/2022       Latest Ref Rng & Units 04/16/2022    3:31 PM 04/25/2020    8:54 AM 07/23/2019    8:31 AM  Hepatic Function  Total Protein 6.0 - 8.3 g/dL  7.1  6.7  6.8   Albumin 3.5 - 5.2 g/dL 4.9  4.7  5.0   AST 0 - 37 U/L _0 ALT 0 - 53 U/L _1 Alk Phosphatase 39 - 117 U/L 49  47  40   Total Bilirubin 0.2 - 1.2 mg/dL 0.6  0.5  0.5   Bilirubin, Direct 0.0 - 0.3 mg/dL 0.1  0.1  0.1     Lab Results  Component Value Date/Time   TSH 1.24 04/16/2022 03:31 PM   TSH 1.39 04/25/2020 08:54 AM       Latest Ref Rng & Units 04/25/2020    8:54 AM 10/20/2018   12:12 PM 10/28/2017   10:33 AM  CBC  WBC 4.0 - 10.5 K/uL  9.3  8.7  7.8   Hemoglobin 13.0 - 17.0 g/dL 14.0  14.1  14.3   Hematocrit 39.0 - 52.0 % 41.0  41.4  43.3   Platelets 150.0 - 400.0 K/uL 290.0  281.0  289.0     No results found for: "VD25OH"  Clinical ASCVD: No  The ASCVD Risk score (Arnett DK, et al., 2019) failed to calculate for the following reasons:   The valid total cholesterol range is 130 to 320 mg/dL       01/25/2022    2:41 PM 02/15/2021    9:09 AM 11/30/2019    1:48 PM  Depression screen PHQ 2/9  Decreased Interest 0 0 0  Down, Depressed, Hopeless 0 0 0  PHQ - 2 Score 0 0 0      Social History   Tobacco Use  Smoking Status Never  Smokeless Tobacco Never   BP Readings from Last 3 Encounters:  11/19/22 128/78  06/14/22 (!) 165/94  05/24/22 130/78   Pulse Readings from Last 3 Encounters:  06/14/22 86  05/24/22 82  04/22/22 91   Wt Readings from Last 3 Encounters:  06/14/22 215 lb (97.5 kg)  05/24/22 216 lb 6.4 oz (98.2 kg)  04/22/22 220 lb (99.8 kg)   BMI Readings from Last 3 Encounters:  06/14/22 32.69 kg/m  05/24/22 32.90 kg/m  04/22/22 33.45 kg/m    Assessment/Interventions: Review of patient past medical history, allergies, medications, health status, including review of consultants reports, laboratory and other test data, was performed as part of comprehensive evaluation and provision of chronic care management services.   SDOH:  (Social Determinants of Health) assessments and interventions performed: Yes (05/13/22) SDOH Interventions    Flowsheet Row Chronic Care Management from 05/13/2022 in [place] at [place] from 01/25/2022 in [place] at [place] from 02/15/2021 in [place] at [place] Interventions -- Intervention Not Indicated Intervention Not Indicated  Housing Interventions -- Intervention Not Indicated Intervention Not Indicated  Transportation Interventions Intervention Not  Indicated Intervention Not Indicated Intervention Not Indicated  Financial Strain Interventions Intervention Not Indicated Intervention Not Indicated Intervention Not Indicated  Physical Activity Interventions -- Intervention Not Indicated Intervention Not Indicated  Stress Interventions -- Intervention Not Indicated Intervention Not Indicated  Social Connections Interventions -- Intervention Not Indicated Intervention Not Indicated      SDOH Screenings   Food Insecurity: No Food Insecurity (04/16/2022)  Housing: [place]  (04/16/2022)  Transportation Needs: No Transportation Needs (05/13/2022)  Alcohol Screen: Low Risk  (01/25/2022)  Depression (PHQ2-9): Low Risk  (01/25/2022)  Financial Resource Strain: Low Risk  (05/13/2022)  Physical Activity:  Inactive (04/16/2022)  Social Connections: Socially Integrated (04/16/2022)  Stress: No Stress Concern Present (04/16/2022)  Tobacco Use: Low Risk  (09/16/2022)    CCM Care Plan  Allergies  Allergen Reactions   Lipitor [Atorvastatin Calcium] Other (See Comments)   Penicillins Other (See Comments)    Reaction=bleeding,diarrhea   Statins Other (See Comments)    Myalgia     Medications Reviewed Today     Reviewed by Viona Gilmore, [facility] (Pharmacist) on 11/19/22 at 1158  Med List Status: <None>   Medication Order Taking? Sig Documenting Provider Last Dose Status Informant  ACCU-CHEK FASTCLIX LANCETS MISC [id number]  Test once daily Dx E11.9 Eulas Post, MD  Active   Apple Cider Vinegar 600 MG CAPS [id number]  Take 1 capsule by mouth daily. [provider]  Active   aspirin EC 81 MG tablet [id number]  Take 81 mg by mouth daily. [provider]  Active   Cholecalciferol (VITAMIN D) 50 MCG (2000 UT) CAPS [id number]  Take 1 capsule by mouth daily. [provider]  Active   CINNAMON PO [id number]  Take 1,000 mg by mouth daily. [provider]  Active   docusate sodium (COLACE) 250 MG capsule [id number]  Take  250 mg by mouth in the morning and at bedtime. [provider]  Active   donepezil (ARICEPT) 10 MG tablet [id number]  Take 1 tablet (10 mg total) by mouth at bedtime. Eulas Post, MD  Active   Garlic 6712 MG CAPS [id number]  Take 1 capsule by mouth daily. [provider]  Active   glucose blood (ACCU-CHEK AVIVA) test strip [id number]  Test once daily. E11.9 Eulas Post, MD  Active   ipratropium (ATROVENT) 0.03 % nasal spray [id number] Yes Place 2 sprays into both nostrils daily. [provider] Taking Active   metFORMIN (GLUCOPHAGE) 500 MG tablet [id number]  TAKE 2 TABLETS TWO TIMES DAILY WITH A MEAL Burchette, Alinda Sierras, MD  Active   Misc Natural Products (YUMVS BEET ROOT-TART CHERRY) 250-0.5 MG CHEW [id number]  Chew 1 tablet by mouth daily. [provider]  Active   omeprazole (PRILOSEC) 20 MG capsule [id number]  Take 20 mg by mouth daily. [provider]  Active   Probiotic Product (PROBIOTIC ADVANCED PO) [id number]  Take 1 capsule by mouth daily. [provider]  Active   Psyllium 100 % PACK [id number]  Take 1 packet by mouth daily. [provider]  Active   REPATHA SURECLICK 563 MG/ML Darden Palmer [id number] Yes INJECT 140MG (1ML) EVERY 14 DAYS Burchette, Alinda Sierras, MD Taking Active   valsartan-hydrochlorothiazide (DIOVAN-HCT) 160-12.5 MG tablet [id number]  TAKE 1 TABLET AT BEDTIME Eulas Post, MD  Active   Zinc 25 MG TABS [id number]  Take 1 tablet by mouth daily. [provider]  Active             Patient Active Problem List   Diagnosis Date Noted   Mild cognitive impairment 06/14/2022   Rhinorrhea 06/14/2022   Type 2 diabetes mellitus, controlled ([place]) 07/04/2015   Hyperglycemia 02/02/2015   Allergic rhinitis 05/14/2013   Hyperlipemia 03/28/2010   ACUTE SEROUS OTITIS MEDIA 02/22/2010   OBESITY, MODERATE 04/25/2009   ERECTILE DYSFUNCTION 04/25/2009   Essential hypertension 03/24/2009   GERD 03/24/2009     Immunization History  Administered Date(s) Administered   Fluad Quad(high Dose 65+) 09/18/2020, 09/20/2022   Influenza Whole 10/03/2011   Influenza, High Dose Seasonal PF 09/24/2016   Influenza,inj,Quad PF,6+ Mos 09/11/2018, 08/26/2019   Influenza-Unspecified  10/14/2015, 09/12/2017, 09/14/2018   MODERNA COVID-19 SARS-COV-2 PEDS BIVALENT BOOSTER 6Y-11Y 09/27/2020   Pneumococcal Conjugate-13 02/03/2014   Pneumococcal Polysaccharide-23 03/28/2010, 09/14/2018   Td 01/07/2008   Tdap 12/01/2018   Zoster Recombinat (Shingrix) 09/14/2018, 11/17/2018   Patient reports his biggest concern right now is with his GERD. He was ok in the past on omeprazole and recently switched to esomeprazole OTC and this didn't seem to help either. It bothers him the most at night time. He reports his breath is bad and was thinking this was coming from this.   Patient has been checking BGs but "not at much as he should". He states he probably checks about once a month but was unsure of recent readings. Patient is having less constipation overall but still having some and it may be coming from the metformin. He has been adding more fiber (he is eating bran) and takes 2 fiber capsules in the morning which seems to help.   Conditions to be addressed/monitored:  Hypertension, Hyperlipidemia, Diabetes, GERD, Osteoarthritis, Allergic Rhinitis, and Memory loss  Conditions addressed this visit: Hypertension, hyperlipidemia, diabetes, GERD  Care Plan : CCM Pharmacy Care Plan  Updates made by Viona Gilmore, [place] since 11/19/2022 12:00 AM     Problem: Problem: Hypertension, Hyperlipidemia, Diabetes, GERD, Osteoarthritis, Allergic Rhinitis, and Memory loss      Long-Range Goal: Patient-Specific Goal   Start Date: 05/13/2022  Expected End Date: 05/14/2023  Recent Progress: On track  Priority: High  Note:   Current Barriers:  Unable to independently monitor therapeutic efficacy Unable to maintain control of  cholesterol  Pharmacist Clinical Goal(s):  Patient will achieve adherence to monitoring guidelines and medication adherence to achieve therapeutic efficacy maintain control of cholesterol as evidenced by next lipid panel  through collaboration with PharmD and provider.   Interventions: 1:1 collaboration with Eulas Post, MD regarding development and update of comprehensive plan of care as evidenced by provider attestation and co-signature Inter-disciplinary care team collaboration (see longitudinal plan of care) Comprehensive medication review performed; medication list updated in electronic medical record  Hypertension (BP goal <140/90) -Controlled -Current treatment: Valsartan-HCTZ 160-12.5 mg 1 tablet at breakfast - Appropriate, Effective, Safe, Accessible -Medications previously tried: n/a  -Current home readings: 128/78 this morning; 125 -135/80; 130/80 - checking weekly -Current dietary habits: pays attention to salt intake; doesn't add salt except corn or tomatoes; no frozen meals or not soup often -Current exercise habits: limited due to joint pain -Denies hypotensive/hypertensive symptoms -Educated on BP goals and benefits of medications for prevention of heart attack, stroke and kidney damage; Importance of home blood pressure monitoring; Proper BP monitoring technique; -Counseled to monitor BP at home weekly, document, and provide log at future appointments -Counseled on diet and exercise extensively Recommended to continue current medication  Hyperlipidemia: (LDL goal < 70) -Controlled -Current treatment: Repatha 140 mg inject every 14 days - Appropriate, Effective, Safe, Accessible -Medications previously tried: Lipitor, Crestor, Zocor (joint pain), fish oil (indigestion)  -Current dietary patterns:  not often eating out - not fast food people; does use butter and olive oil  -Current exercise habits: limited due to pain; doing PT twice a week and some at home;  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 walking and owns lots of land -Educated on Cholesterol goals;  Importance of limiting foods high in cholesterol; Exercise goal of 150 minutes per week; -Counseled on diet and exercise extensively Recommended to continue current medication  Diabetes (A1c goal <7%) -Controlled -Current medications: Metformin 500 mg 2 tablets twice  daily - Appropriate, Effective, Safe, Accessible -Medications previously tried: none  -Current home glucose readings fasting glucose: not checking often post prandial glucose: not checking often -Denies hypoglycemic/hyperglycemic symptoms -Current meal patterns:  breakfast: n/a  lunch: n/a  dinner: n/a snacks: n/a drinks: n/a -Current exercise: n/a -Educated on A1c and blood sugar goals; Benefits of routine self-monitoring of blood sugar; Carbohydrate counting and/or plate method -Counseled to check feet daily and get yearly eye exams -Counseled on diet and exercise extensively Recommended to continue current medication Recommended taking metformin after eating to minimize GI side effects.  Memory loss (Goal: slow progression of memory loss) -Controlled -Current treatment  Donepezil 10 mg 1 tablet at bedtime - Appropriate, Effective, Safe, Accessible -Medications previously tried: none  -Recommended to continue current medication  GERD (Goal: minimize symptoms) -Uncontrolled -Current treatment  Esomeprazole 20 mg 1 capsule daily - Appropriate, Query effective, Safe, Accessible Apple cider vinegar 600 mg 1 tablet daily - Appropriate, Effective, Safe, Accessible -Medications previously tried: omeprazole (ineffective), pantoprazole (insurance coverage) -Counseled on non-pharmacologic management of symptoms such as elevating the head of your bed, avoiding eating 2-3 hours before bed, avoiding triggering foods such as acidic, spicy, or fatty foods, eating smaller meals, and wearing clothes that are loose around the waist. Recommended  switching Nexium to pantoprazole.   Health Maintenance -Vaccine gaps: COVID booster -Current therapy:  Aspirin 81 mg 1 tablet daily  Zinc 25 mg 1 tablet daily Vitamin D 2000 units 1 capsule daily Garlic 0932 mg 2 capsules daily  Psyllium packet 1 packet daily Prevagen 1 tablet daily -Educated on Cost vs benefit of each product must be carefully weighed by individual consumer - Patient is not sure all of these supplements are helping. -Recommended to continue current medication  Patient Goals/Self-Care Activities Patient will:  - take medications as prescribed as evidenced by patient report and record review check glucose 1-2 times a week, document, and provide at future appointments check blood pressure weekly, document, and provide at future appointments target a minimum of 150 minutes of moderate intensity exercise weekly engage in dietary modifications by increasing fiber intake.  Follow Up Plan: The care management team will reach out to the patient again over the next 7 days.        Medication Assistance: None required.  Patient affirms current coverage meets needs.  Compliance/Adherence/Medication fill history: Care Gaps: COVID booster, foot exam, A1c Last BP - 165/94 on 06/14/2022 Last A1C - 6.3 on 04/16/2022  Star-Rating Drugs: Metformin 500 mg - last filled 11/15/2022 30 DS at [facility] Diovan HCT 160/12.5 mg - last filled 10/08/2022 90 DS at [facility].  Patient's preferred pharmacy is:  [facility], [place] [address] Phone: [phone] Fax: [facility] [address] [place] [address] [place] [address] Phone: [phone] Fax: [phone]  Uses pill box? No - in medicine drawer and puts them in a bathroom cup Pt endorses 99% compliance  (recommended back up plan for checking - ie. Pillbox, marking a calendar, flipping pill  bottles upside down when taken, etc.)  We discussed: Current pharmacy is preferred with insurance plan and patient is satisfied with pharmacy services Patient decided to: Continue current medication management strategy  Care Plan and Follow Up Patient Decision:  Patient agrees to Care Plan and Follow-up.  Plan: The care management team will reach out to the patient  again over the next 7 days.  Jeni Salles, PharmD, [place] Pharmacist [place] at [place]

## 2022-11-19 ENCOUNTER — Other Ambulatory Visit: Payer: Self-pay | Admitting: Family Medicine

## 2022-11-19 ENCOUNTER — Ambulatory Visit (INDEPENDENT_AMBULATORY_CARE_PROVIDER_SITE_OTHER): Payer: Medicare PPO | Admitting: Pharmacist

## 2022-11-19 VITALS — BP 128/78

## 2022-11-19 DIAGNOSIS — E119 Type 2 diabetes mellitus without complications: Secondary | ICD-10-CM

## 2022-11-19 DIAGNOSIS — E785 Hyperlipidemia, unspecified: Secondary | ICD-10-CM

## 2022-11-19 DIAGNOSIS — I10 Essential (primary) hypertension: Secondary | ICD-10-CM

## 2022-11-19 MED ORDER — PANTOPRAZOLE SODIUM 40 MG PO TBEC: 40.0000 mg | DELAYED_RELEASE_TABLET | Freq: Every day | ORAL | 3 refills | Status: DC

## 2022-11-19 NOTE — Patient Instructions (Signed)
Hi [place],  It was great to catch up again! I will let you know what Dr. Caryl Never says about the heartburn medication.  Please try to work on checking your blood sugar about once a week and possibly at the same time as the blood pressure if that helps you remember.  Please reach out to me if you have any questions or need anything!  Best, Maddie  Gaylord Shih, PharmD, [facility] Clinical Pharmacist [facility] at [place] [phone]   Visit Information   Goals Addressed   None    Patient Care Plan: CCM Pharmacy Care Plan     Problem Identified: Problem: Hypertension, Hyperlipidemia, Diabetes, GERD, Osteoarthritis, Allergic Rhinitis, and Memory loss      Long-Range Goal: Patient-Specific Goal   Start Date: 05/13/2022  Expected End Date: 05/14/2023  Recent Progress: On track  Priority: High  Note:   Current Barriers:  Unable to independently monitor therapeutic efficacy Unable to maintain control of cholesterol  Pharmacist Clinical Goal(s):  Patient will achieve adherence to monitoring guidelines and medication adherence to achieve therapeutic efficacy maintain control of cholesterol as evidenced by next lipid panel  through collaboration with PharmD and provider.   Interventions: 1:1 collaboration with Kristian Covey, MD regarding development and update of comprehensive plan of care as evidenced by provider attestation and co-signature Inter-disciplinary care team collaboration (see longitudinal plan of care) Comprehensive medication review performed; medication list updated in electronic medical record  Hypertension (BP goal <140/90) -Controlled -Current treatment: Valsartan-HCTZ 160-12.5 mg 1 tablet at breakfast - Appropriate, Effective, Safe, Accessible -Medications previously tried: n/a  -Current home readings: 128/78 this morning; 125 -135/80; 130/80 - checking weekly -Current dietary habits: pays attention to salt intake; doesn't add salt except  corn or tomatoes; no frozen meals or not soup often -Current exercise habits: limited due to joint pain -Denies hypotensive/hypertensive symptoms -Educated on BP goals and benefits of medications for prevention of heart attack, stroke and kidney damage; Importance of home blood pressure monitoring; Proper BP monitoring technique; -Counseled to monitor BP at home weekly, document, and provide log at future appointments -Counseled on diet and exercise extensively Recommended to continue current medication  Hyperlipidemia: (LDL goal < 70) -Controlled -Current treatment: Repatha 140 mg inject every 14 days - Appropriate, Effective, Safe, Accessible -Medications previously tried: Lipitor, Crestor, Zocor (joint pain), fish oil (indigestion)  -Current dietary patterns:  not often eating out - not fast food people; does use butter and olive oil  -Current exercise habits: limited due to pain; doing PT twice a week and some at home; 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 walking and owns lots of land -Educated on Cholesterol goals;  Importance of limiting foods high in cholesterol; Exercise goal of 150 minutes per week; -Counseled on diet and exercise extensively Recommended to continue current medication  Diabetes (A1c goal <7%) -Controlled -Current medications: Metformin 500 mg 2 tablets twice daily - Appropriate, Effective, Safe, Accessible -Medications previously tried: none  -Current home glucose readings fasting glucose: not checking often post prandial glucose: not checking often -Denies hypoglycemic/hyperglycemic symptoms -Current meal patterns:  breakfast: n/a  lunch: n/a  dinner: n/a snacks: n/a drinks: n/a -Current exercise: n/a -Educated on A1c and blood sugar goals; Benefits of routine self-monitoring of blood sugar; Carbohydrate counting and/or plate method -Counseled to check feet daily and get yearly eye exams -Counseled on diet and exercise extensively Recommended to continue current  medication Recommended taking metformin after eating to minimize GI side effects.  Memory loss (Goal: slow progression of memory loss) -  Controlled -Current treatment  Donepezil 10 mg 1 tablet at bedtime - Appropriate, Effective, Safe, Accessible -Medications previously tried: none  -Recommended to continue current medication  GERD (Goal: minimize symptoms) -Uncontrolled -Current treatment  Esomeprazole 20 mg 1 capsule daily - Appropriate, Query effective, Safe, Accessible Apple cider vinegar 600 mg 1 tablet daily - Appropriate, Effective, Safe, Accessible -Medications previously tried: omeprazole (ineffective), pantoprazole (insurance coverage) -Counseled on non-pharmacologic management of symptoms such as elevating the head of your bed, avoiding eating 2-3 hours before bed, avoiding triggering foods such as acidic, spicy, or fatty foods, eating smaller meals, and wearing clothes that are loose around the waist. Recommended switching Nexium to pantoprazole.   Health Maintenance -Vaccine gaps: COVID booster -Current therapy:  Aspirin 81 mg 1 tablet daily  Zinc 25 mg 1 tablet daily Vitamin D 2000 units 1 capsule daily Garlic 1000 mg 2 capsules daily  Psyllium packet 1 packet daily Prevagen 1 tablet daily -Educated on Cost vs benefit of each product must be carefully weighed by individual consumer - Patient is not sure all of these supplements are helping. -Recommended to continue current medication  Patient Goals/Self-Care Activities Patient will:  - take medications as prescribed as evidenced by patient report and record review check glucose 1-2 times a week, document, and provide at future appointments check blood pressure weekly, document, and provide at future appointments target a minimum of 150 minutes of moderate intensity exercise weekly engage in dietary modifications by increasing fiber intake.  Follow Up Plan: The care management team will reach out to the patient  again over the next 7 days.        Patient verbalizes understanding of instructions and care plan provided today and agrees to view in MyChart. Active MyChart status and patient understanding of how to access instructions and care plan via MyChart confirmed with patient.    The pharmacy team will reach out to the patient again over the next 7 days.   Verner Chol, [facility]

## 2022-11-22 ENCOUNTER — Other Ambulatory Visit (HOSPITAL_COMMUNITY): Payer: Self-pay

## 2022-11-28 DIAGNOSIS — R42 Dizziness and giddiness: Secondary | ICD-10-CM | POA: Diagnosis not present

## 2022-12-01 DIAGNOSIS — I10 Essential (primary) hypertension: Secondary | ICD-10-CM

## 2022-12-01 DIAGNOSIS — E785 Hyperlipidemia, unspecified: Secondary | ICD-10-CM

## 2022-12-01 DIAGNOSIS — E119 Type 2 diabetes mellitus without complications: Secondary | ICD-10-CM

## 2022-12-04 ENCOUNTER — Other Ambulatory Visit: Payer: Self-pay | Admitting: Family Medicine

## 2022-12-06 ENCOUNTER — Ambulatory Visit: Payer: Medicare PPO | Admitting: Family Medicine

## 2022-12-06 ENCOUNTER — Encounter: Payer: Self-pay | Admitting: Family Medicine

## 2022-12-06 VITALS — BP 120/72 | HR 82 | Temp 98.2°F | Ht 68.0 in | Wt 215.2 lb

## 2022-12-06 DIAGNOSIS — R059 Cough, unspecified: Secondary | ICD-10-CM

## 2022-12-06 DIAGNOSIS — R4189 Other symptoms and signs involving cognitive functions and awareness: Secondary | ICD-10-CM | POA: Diagnosis not present

## 2022-12-06 DIAGNOSIS — E119 Type 2 diabetes mellitus without complications: Secondary | ICD-10-CM | POA: Diagnosis not present

## 2022-12-06 DIAGNOSIS — Z7409 Other reduced mobility: Secondary | ICD-10-CM

## 2022-12-06 DIAGNOSIS — R3915 Urgency of urination: Secondary | ICD-10-CM | POA: Diagnosis not present

## 2022-12-06 DIAGNOSIS — E785 Hyperlipidemia, unspecified: Secondary | ICD-10-CM

## 2022-12-06 LAB — POCT GLYCOSYLATED HEMOGLOBIN (HGB A1C): Hemoglobin A1C: 6 % — AB (ref 4.0–5.6)

## 2022-12-06 MED ORDER — MIRABEGRON ER 25 MG PO TB24: 25.0000 mg | ORAL_TABLET | Freq: Every day | ORAL | 5 refills | Status: DC

## 2022-12-06 NOTE — Progress Notes (Signed)
Established Patient Office Visit  Subjective   Patient ID: Clifford Gibson, male    DOB: June 30, 1946  Age: 77 y.o. MRN: [id number]  Chief Complaint  Patient presents with   Labs Only   Cough    Patient complains of cough, Non productive, x1 week, Tried Nyquil with little relief     HPI   Clifford Gibson is here accompanied by his wife for several concerns.  He had some cough productive of mostly clear sputum for the past week.  Home COVID test negative.  No fever.  Slowly improving overall.  No dyspnea.  He has history of type 2 diabetes.  Currently treated with metformin.  Needs follow-up A1c.  They do not monitor blood sugars regularly.  Last A1c was 6.3%  He has history of severe hyperlipidemia.  Did not tolerate statins.  Currently on Repatha and repeat lipids in August were vastly improved.  He is doing injections every couple of weeks.  He has had some urine urgency.  No burning with urination.  Going on for several months.  Especially bothersome with things like traveling.  Minimal caffeine use.  Only usually gets up once or twice at night but when he gets the signal to go has to go urgently to the bathroom.  No slow stream.  He has history of poor mobility.  Having increasing difficulty sometimes transferring for example from couch or sometimes chair to standing.  Previously benefited tremendously from physical therapy.  He had some right ankle pain with dorsi flexion but no foot drop.  He would like to consider repeat trial of physical therapy.  Denies any falls recently.  Past Medical History:  Diagnosis Date   Acute serous otitis media 02/22/2010   Arthritis    Diabetes mellitus without complication ([place])    ERECTILE DYSFUNCTION 04/25/2009   GERD 03/24/2009   HYPERLIPIDEMIA 03/28/2010   HYPERTENSION 03/24/2009   OBESITY, MODERATE 04/25/2009   PONV (postoperative nausea and vomiting)    Past Surgical History:  Procedure Laterality Date   COLONOSCOPY N/A 06/21/2015   Procedure:  COLONOSCOPY;  Surgeon: Rogene Houston, MD;  [facility];  Service: Endoscopy;  Laterality: N/A;  930 - moved to 7/20 @ 7:30   NASAL SEPTUM SURGERY  1974   TONSILLECTOMY     3 yrs. old   TOTAL KNEE ARTHROPLASTY  06/11/2012   Procedure: TOTAL KNEE ARTHROPLASTY;  Surgeon: Johnn Hai, MD;  Location: [facility];  Service: Orthopedics;  Laterality: Right;    reports that he has never smoked. He has never used smokeless tobacco. He reports that he does not drink alcohol and does not use drugs. family history includes Dementia in his father; Scleroderma in his mother. Allergies  Allergen Reactions   Lipitor [Atorvastatin Calcium] Other (See Comments)   Penicillins Other (See Comments)    Reaction=bleeding,diarrhea   Statins Other (See Comments)    Myalgia     Review of Systems  Constitutional:  Negative for chills, fever and malaise/fatigue.  Eyes:  Negative for blurred vision.  Respiratory:  Negative for shortness of breath.   Cardiovascular:  Negative for chest pain.  Gastrointestinal:  Negative for abdominal pain.  Genitourinary:  Positive for urgency. Negative for dysuria, flank pain and hematuria.  Neurological:  Negative for dizziness, weakness and headaches.      Objective:     BP 120/72 (BP Location: Left Arm, Patient Position: Sitting, Cuff Size: Normal)   Pulse 82   Temp 98.2 F (36.8 C) (  Oral)   Ht 5\' 8"  (1.727 m)   Wt 215 lb 3.2 oz (97.6 kg)   SpO2 96%   BMI 32.72 kg/m    Physical Exam Vitals reviewed.  Constitutional:      Appearance: Normal appearance. He is well-developed.  HENT:     Right Ear: External ear normal.     Left Ear: External ear normal.  Eyes:     Pupils: Pupils are equal, round, and reactive to light.  Neck:     Thyroid: No thyromegaly.  Cardiovascular:     Rate and Rhythm: Normal rate and regular rhythm.  Pulmonary:     Effort: Pulmonary effort is normal. No respiratory distress.     Breath sounds: Normal breath sounds. No  wheezing or rales.  Musculoskeletal:     Cervical back: Neck supple.     Comments: No lumbar spinal tenderness.  Straight leg raises are negative bilaterally.  Neurological:     Mental Status: He is alert and oriented to person, place, and time.     Comments: Symmetric lower extremity reflexes.  Full strength with plantarflexion, dorsiflexion, and knee extension bilaterally.  He has good strength with hip flexion.      Results for orders placed or performed in visit on 12/06/22  POCT glycosylated hemoglobin (Hb A1C)  Result Value Ref Range   Hemoglobin A1C 6.0 (A) 4.0 - 5.6 %   HbA1c POC (<> result, manual entry)     HbA1c, POC (prediabetic range)     HbA1c, POC (controlled diabetic range)        The ASCVD Risk score (Arnett DK, et al., 2019) failed to calculate for the following reasons:   The valid total cholesterol range is 130 to 320 mg/dL    Assessment & Plan:   #1 probable viral URI with cough.  Nonfocal exam.  Gradually improving.  Continue over-the-counter Mucinex.  Follow-up for now.  Follow-up immediately for any fever or increased shortness of breath  #2 type 2 diabetes stable and improving with A1c today 6.0%.  Continue low glycemic diet  #3 dyslipidemia.  Previous intolerance with multiple statins.  Continue Repatha.  Lipids greatly improved on Repatha  #4 urine urgency.  Continue to minimize caffeine use.  Discussed possible trial of Myrbetriq.  Will start 25 mg once daily.  He is not describing any obstructive urinary symptoms  #5 poor mobility.  Generalized weakness.  Fairly sedentary.  Set up outpatient physical therapy for general strengthening and improve mobility  #6 history of cognitive impairment.  Followed by neurology.  He has scheduled follow-up with neuropsychologist but this is not till next Fall   No follow-ups on file.    [name], MD

## 2022-12-06 NOTE — Patient Instructions (Signed)
A1C is improved further to 6.0%  Follow up immediately for any fever or increased shortness of breath  I will be setting up physical therapy referral  Start the Myrbetriq one daily.   Let me know in one month if no improvement.

## 2022-12-16 ENCOUNTER — Other Ambulatory Visit: Payer: Self-pay

## 2022-12-16 ENCOUNTER — Ambulatory Visit: Payer: Medicare PPO | Attending: Family Medicine

## 2022-12-16 DIAGNOSIS — Z7409 Other reduced mobility: Secondary | ICD-10-CM | POA: Diagnosis not present

## 2022-12-16 DIAGNOSIS — R2681 Unsteadiness on feet: Secondary | ICD-10-CM | POA: Diagnosis not present

## 2022-12-16 DIAGNOSIS — M6281 Muscle weakness (generalized): Secondary | ICD-10-CM | POA: Insufficient documentation

## 2022-12-16 NOTE — Therapy (Signed)
OUTPATIENT PHYSICAL THERAPY LOWER EXTREMITY EVALUATION   Patient Name: Clifford Gibson MRN: [id number] DOB:05-Jun-1946, 77 y.o., male Today's Date: 12/16/2022  END OF SESSION:  PT End of Session - 12/16/22 1304     Visit Number 1    Number of Visits 12    Date for PT Re-Evaluation 02/28/23    PT Start Time 1305    PT Stop Time 1340    PT Time Calculation (min) 35 min    Activity Tolerance Patient tolerated treatment well    Behavior During Therapy [facility] for tasks assessed/performed             Past Medical History:  Diagnosis Date   Acute serous otitis media 02/22/2010   Arthritis    Diabetes mellitus without complication (H[facility])    ERECTILE DYSFUNCTION 04/25/2009   GERD 03/24/2009   HYPERLIPIDEMIA 03/28/2010   HYPERTENSION 03/24/2009   OBESITY, MODERATE 04/25/2009   PONV (postoperative nausea and vomiting)    Past Surgical History:  Procedure Laterality Date   COLONOSCOPY N/A 06/21/2015   Procedure: COLONOSCOPY;  Surgeon: Malissa Hippo, MD;  [facility];  Service: Endoscopy;  Laterality: N/A;  930 - moved to 7/20 @ 7:30   NASAL SEPTUM SURGERY  1974   TONSILLECTOMY     3 yrs. old   TOTAL KNEE ARTHROPLASTY  06/11/2012   Procedure: TOTAL KNEE ARTHROPLASTY;  Surgeon: Javier Docker, MD;  [facility];  Service: Orthopedics;  Laterality: Right;   Patient Active Problem List   Diagnosis Date Noted   Mild cognitive impairment 06/14/2022   Rhinorrhea 06/14/2022   Type 2 diabetes mellitus, controlled (HCC) 07/04/2015   Hyperglycemia 02/02/2015   Allergic rhinitis 05/14/2013   Hyperlipemia 03/28/2010   ACUTE SEROUS OTITIS MEDIA 02/22/2010   OBESITY, MODERATE 04/25/2009   ERECTILE DYSFUNCTION 04/25/2009   Essential hypertension 03/24/2009   GERD 03/24/2009   REFERRING PROVIDER: Kristian Covey, MD   REFERRING DIAG: Poor mobility   THERAPY DIAG:  Muscle weakness (generalized)  Unsteadiness on feet  Rationale for Evaluation and Treatment:  Rehabilitation  ONSET DATE: November 2023  SUBJECTIVE:   SUBJECTIVE STATEMENT: Patient reports that his hips and ankles started bothering him since November 2023. He feels that this is making it harder for him to walk. He has noticed that walking around helps to reduce this pain.   PERTINENT HISTORY: HTN, DM, obesity, and OA PAIN:  Are you having pain? No  PRECAUTIONS: None  WEIGHT BEARING RESTRICTIONS: No  FALLS:  Has patient fallen in last 6 months? No  LIVING ENVIRONMENT: Lives with: lives with their family Lives in: House/apartment Stairs: Yes: Internal: 18 steps; on right going up; step to pattern Has following equipment at home: None  OCCUPATION: retired   PLOF: Independent  PATIENT GOALS: improved mobility, strength, and balance  NEXT MD VISIT: 12/19/22  OBJECTIVE:  COGNITION: Overall cognitive status: Within functional limits for tasks assessed     SENSATION: Patient reports no numbness or tingling.   EDEMA:  No lower extremity edema observed  POSTURE: rounded shoulders, forward head, decreased lumbar lordosis, increased thoracic kyphosis, and posterior pelvic tilt  LOWER EXTREMITY ROM: WFL for activities assessed  LOWER EXTREMITY MMT:  MMT Right eval Left eval  Hip flexion 4/5 4/5  Hip extension    Hip abduction    Hip adduction    Hip internal rotation    Hip external rotation    Knee flexion 4+/5 4/5  Knee extension 4+/5 4+/5  Ankle  dorsiflexion 4+/5 4+/5  Ankle plantarflexion    Ankle inversion    Ankle eversion     (Blank rows = not tested)  FUNCTIONAL TESTS:  5 times sit to stand: 16.49 seconds without UE support Timed up and go (TUG): 17.24 seconds  Gait speed: 0.58 m/s  GAIT: Assistive device utilized: None Level of assistance: Complete Independence Comments: Decreased gait speed, stride length, poor foot clearance bilaterally, and minimal heel strike and toe off    TODAY'S TREATMENT:                                                                                                                               DATE:                                     1/15 EXERCISE LOG  Exercise Repetitions and Resistance Comments  Nustep L3 x 12 minutes    Blank cell = exercise not performed today   PATIENT EDUCATION:  Education details: POC, healing, prognosis, and goals for therapy Person educated: Patient Education method: Explanation Education comprehension: verbalized understanding  HOME EXERCISE PROGRAM:   ASSESSMENT:  CLINICAL IMPRESSION: Patient is a 77 y.o. male who was seen today for physical therapy evaluation and treatment for lower extremity weakness and deconditioning.  He is at an elevated risk of fall while as evidenced by his manual muscle test scores, gait speed, timed up and go, 5 times sit to stand assessments.  Recommend that he continue with skilled physical therapy to address his impairments to maximize his safety and functional mobility.  OBJECTIVE IMPAIRMENTS: Abnormal gait, decreased activity tolerance, decreased balance, decreased mobility, difficulty walking, and decreased strength.   ACTIVITY LIMITATIONS: standing, squatting, stairs, transfers, and locomotion level  PARTICIPATION LIMITATIONS: meal prep, cleaning, laundry, shopping, and community activity  PERSONAL FACTORS: Time since onset of injury/illness/exacerbation and 3+ comorbidities: HTN, DM, obesity, and OA  are also affecting patient's functional outcome.   REHAB POTENTIAL: Good  CLINICAL DECISION MAKING: Evolving/moderate complexity  EVALUATION COMPLEXITY: Moderate   GOALS: Goals reviewed with patient? Yes  SHORT TERM GOALS: Target date: 01/06/23 Patient will be independent with his initial HEP.  Baseline: Goal status: INITIAL  2.  Patient will be able to demonstrate improved gait speed to at least 0.8 m/s for improved household mobility.  Baseline:  Goal status: INITIAL  LONG TERM GOALS: Target date:  01/27/23  Patient will be independent with his advanced HEP.  Baseline:  Goal status: INITIAL  2.  Patient will be able to demonstrate improved gait speed to at least 1.0 m/s for improved community mobility. Baseline:  Goal status: INITIAL  3.  Patient will improve his TUG time to 12 seconds or less for improved safety.  Baseline:  Goal status: INITIAL  4.  Patient will improve his five time sit to stand time to 12 seconds or less  for improved lower extremity power.  Baseline:  Goal status: INITIAL  PLAN:  PT FREQUENCY: 2x/week  PT DURATION: 6 weeks  PLANNED INTERVENTIONS: Therapeutic exercises, Therapeutic activity, Neuromuscular re-education, Balance training, Gait training, Patient/Family education, Self Care, Stair training, Cryotherapy, Moist heat, and Re-evaluation  PLAN FOR NEXT SESSION: nustep, lower extremity strengthening, and balance interventions   Darlin Coco, PT 12/16/2022, 2:16 PM

## 2022-12-17 DIAGNOSIS — H903 Sensorineural hearing loss, bilateral: Secondary | ICD-10-CM | POA: Diagnosis not present

## 2022-12-17 DIAGNOSIS — R42 Dizziness and giddiness: Secondary | ICD-10-CM | POA: Diagnosis not present

## 2022-12-20 ENCOUNTER — Ambulatory Visit: Payer: Medicare PPO | Admitting: Physical Therapy

## 2022-12-20 DIAGNOSIS — R2681 Unsteadiness on feet: Secondary | ICD-10-CM

## 2022-12-20 DIAGNOSIS — M6281 Muscle weakness (generalized): Secondary | ICD-10-CM | POA: Diagnosis not present

## 2022-12-20 DIAGNOSIS — Z7409 Other reduced mobility: Secondary | ICD-10-CM | POA: Diagnosis not present

## 2022-12-20 NOTE — Therapy (Signed)
OUTPATIENT PHYSICAL THERAPY LOWER EXTREMITY EVALUATION   Patient Name: Clifford Gibson MRN: [id number] DOB:13-Feb-1946, 77 y.o., male Today's Date: 12/20/2022  END OF SESSION:  PT End of Session - 12/20/22 1101     Visit Number 2    Number of Visits 12    Date for PT Re-Evaluation 02/28/23    PT Start Time 1030    PT Stop Time 1117    PT Time Calculation (min) 47 min    Activity Tolerance Patient tolerated treatment well    Behavior During Therapy [facility] for tasks assessed/performed              Past Medical History:  Diagnosis Date   Acute serous otitis media 02/22/2010   Arthritis    Diabetes mellitus without complication ([place])    ERECTILE DYSFUNCTION 04/25/2009   GERD 03/24/2009   HYPERLIPIDEMIA 03/28/2010   HYPERTENSION 03/24/2009   OBESITY, MODERATE 04/25/2009   PONV (postoperative nausea and vomiting)    Past Surgical History:  Procedure Laterality Date   COLONOSCOPY N/A 06/21/2015   Procedure: COLONOSCOPY;  Surgeon: Rogene Houston, MD;  [facility];  Service: Endoscopy;  Laterality: N/A;  930 - moved to 7/20 @ 7:30   NASAL SEPTUM SURGERY  1974   TONSILLECTOMY     3 yrs. old   TOTAL KNEE ARTHROPLASTY  06/11/2012   Procedure: TOTAL KNEE ARTHROPLASTY;  Surgeon: Johnn Hai, MD;  [facility];  Service: Orthopedics;  Laterality: Right;   Patient Active Problem List   Diagnosis Date Noted   Mild cognitive impairment 06/14/2022   Rhinorrhea 06/14/2022   Type 2 diabetes mellitus, controlled ([place]) 07/04/2015   Hyperglycemia 02/02/2015   Allergic rhinitis 05/14/2013   Hyperlipemia 03/28/2010   ACUTE SEROUS OTITIS MEDIA 02/22/2010   OBESITY, MODERATE 04/25/2009   ERECTILE DYSFUNCTION 04/25/2009   Essential hypertension 03/24/2009   GERD 03/24/2009   REFERRING PROVIDER: Eulas Post, MD   REFERRING DIAG: Poor mobility   THERAPY DIAG:  Muscle weakness (generalized)  Unsteadiness on feet  Rationale for Evaluation and Treatment:  Rehabilitation  ONSET DATE: November 2023  SUBJECTIVE:   SUBJECTIVE STATEMENT: No new complaints.  Recommended he use a cane for additional safety.  He states he has one and even uses a staff which is helpful for getting up.   PERTINENT HISTORY: HTN, DM, obesity, and OA PAIN:  Are you having pain? No  PRECAUTIONS: None  WEIGHT BEARING RESTRICTIONS: No  FALLS:  Has patient fallen in last 6 months? No  LIVING ENVIRONMENT: Lives with: lives with their family Lives in: House/apartment Stairs: Yes: Internal: 18 steps; on right going up; step to pattern Has following equipment at home: None  OCCUPATION: retired   PLOF: Independent  PATIENT GOALS: improved mobility, strength, and balance  NEXT MD VISIT: 12/19/22  OBJECTIVE:                                     EXERCISE LOG  Exercise Repetitions and Resistance Comments  Nustep level 4 16 minutes   Rockerboard for/back In parallel bars x 4 minutes.   Rockerboard Side to side. In parallel bars x 4 minutes.   Knee ext with 10# 3 minutes.   Ham curls with 30# 3 minutes   Back ext with 60# 3 minutes.   Ab curls with 60# 3 minutes.   Leg press with 2 plates 3 minutes  ASSESSMENT:  CLINICAL IMPRESSION: Patient did very well today and is highly motivated.  Recommend he use a cane for additional safety.  He states he will likely transfer to [facility] outpatient to work with a Neuro/Vestibular PT once he gets a referral from his MD  OBJECTIVE IMPAIRMENTS: Abnormal gait, decreased activity tolerance, decreased balance, decreased mobility, difficulty walking, and decreased strength.   He  ACTIVITY LIMITATIONS: standing, squatting, stairs, transfers, and locomotion level  PARTICIPATION LIMITATIONS: meal prep, cleaning, laundry, shopping, and community activity  PERSONAL FACTORS: Time since onset of injury/illness/exacerbation and 3+ comorbidities: HTN, DM, obesity, and OA  are also affecting patient's functional outcome.    REHAB POTENTIAL: Good  CLINICAL DECISION MAKING: Evolving/moderate complexity  EVALUATION COMPLEXITY: Moderate   GOALS: Goals reviewed with patient? Yes  SHORT TERM GOALS: Target date: 01/06/23 Patient will be independent with his initial HEP.  Baseline: Goal status: INITIAL  2.  Patient will be able to demonstrate improved gait speed to at least 0.8 m/s for improved household mobility.  Baseline:  Goal status: INITIAL  LONG TERM GOALS: Target date: 01/27/23  Patient will be independent with his advanced HEP.  Baseline:  Goal status: INITIAL  2.  Patient will be able to demonstrate improved gait speed to at least 1.0 m/s for improved community mobility. Baseline:  Goal status: INITIAL  3.  Patient will improve his TUG time to 12 seconds or less for improved safety.  Baseline:  Goal status: INITIAL  4.  Patient will improve his five time sit to stand time to 12 seconds or less for improved lower extremity power.  Baseline:  Goal status: INITIAL  PLAN:  PT FREQUENCY: 2x/week  PT DURATION: 6 weeks  PLANNED INTERVENTIONS: Therapeutic exercises, Therapeutic activity, Neuromuscular re-education, Balance training, Gait training, Patient/Family education, Self Care, Stair training, Cryotherapy, Moist heat, and Re-evaluation  PLAN FOR NEXT SESSION: nustep, lower extremity strengthening, and balance interventions   Clifford Gibson, [place], PT 12/20/2022, 12:01 PM

## 2022-12-24 ENCOUNTER — Encounter: Payer: Medicare PPO | Admitting: Physical Therapy

## 2022-12-26 ENCOUNTER — Ambulatory Visit: Payer: Medicare PPO

## 2022-12-26 DIAGNOSIS — R2681 Unsteadiness on feet: Secondary | ICD-10-CM | POA: Diagnosis not present

## 2022-12-26 DIAGNOSIS — Z7409 Other reduced mobility: Secondary | ICD-10-CM | POA: Diagnosis not present

## 2022-12-26 DIAGNOSIS — M6281 Muscle weakness (generalized): Secondary | ICD-10-CM

## 2022-12-26 NOTE — Therapy (Signed)
OUTPATIENT PHYSICAL THERAPY LOWER EXTREMITY TREATMENT   Patient Name: Clifford Gibson MRN: [id number] DOB:September 04, 1946, 77 y.o., male Today's Date: 12/26/2022  END OF SESSION:  PT End of Session - 12/26/22 1035     Visit Number 3    Number of Visits 12    Date for PT Re-Evaluation 02/28/23    PT Start Time 1030    Activity Tolerance Patient tolerated treatment well    Behavior During Therapy [facility] for tasks assessed/performed              Past Medical History:  Diagnosis Date   Acute serous otitis media 02/22/2010   Arthritis    Diabetes mellitus without complication ([place])    ERECTILE DYSFUNCTION 04/25/2009   GERD 03/24/2009   HYPERLIPIDEMIA 03/28/2010   HYPERTENSION 03/24/2009   OBESITY, MODERATE 04/25/2009   PONV (postoperative nausea and vomiting)    Past Surgical History:  Procedure Laterality Date   COLONOSCOPY N/A 06/21/2015   Procedure: COLONOSCOPY;  Surgeon: Rogene Houston, MD;  [facility];  Service: Endoscopy;  Laterality: N/A;  930 - moved to 7/20 @ 7:30   NASAL SEPTUM SURGERY  1974   TONSILLECTOMY     3 yrs. old   TOTAL KNEE ARTHROPLASTY  06/11/2012   Procedure: TOTAL KNEE ARTHROPLASTY;  Surgeon: Johnn Hai, MD;  [facility];  Service: Orthopedics;  Laterality: Right;   Patient Active Problem List   Diagnosis Date Noted   Mild cognitive impairment 06/14/2022   Rhinorrhea 06/14/2022   Type 2 diabetes mellitus, controlled ([place]) 07/04/2015   Hyperglycemia 02/02/2015   Allergic rhinitis 05/14/2013   Hyperlipemia 03/28/2010   ACUTE SEROUS OTITIS MEDIA 02/22/2010   OBESITY, MODERATE 04/25/2009   ERECTILE DYSFUNCTION 04/25/2009   Essential hypertension 03/24/2009   GERD 03/24/2009   REFERRING PROVIDER: Eulas Post, MD   REFERRING DIAG: Poor mobility   THERAPY DIAG:  Muscle weakness (generalized)  Unsteadiness on feet  Rationale for Evaluation and Treatment: Rehabilitation  ONSET DATE: November 2023  SUBJECTIVE:    SUBJECTIVE STATEMENT: Pt denies any pain.  Pt not using cane when arriving for today's treatment.   PERTINENT HISTORY: HTN, DM, obesity, and OA PAIN:  Are you having pain? No  PRECAUTIONS: None  WEIGHT BEARING RESTRICTIONS: No  FALLS:  Has patient fallen in last 6 months? No  LIVING ENVIRONMENT: Lives with: lives with their family Lives in: House/apartment Stairs: Yes: Internal: 18 steps; on right going up; step to pattern Has following equipment at home: None  OCCUPATION: retired   PLOF: Independent  PATIENT GOALS: improved mobility, strength, and balance  NEXT MD VISIT: 12/19/22  OBJECTIVE:                                     EXERCISE LOG  Exercise Repetitions and Resistance Comments  Nustep  Lvl 4 x 20 minutes   Rockerboard for/back X3 mins   Rockerboard Side to side. X3 mins   Knee ext with 10# 4 minutes.   Ham curls with 30# 3.5 minutes   Back ext with 60#    Ab curls with 60#    Leg press  2 plates; seat 6; 4 minutes   NBOS  Airex 3 reps x 1 min    ASSESSMENT:  CLINICAL IMPRESSION: Pt arrives for today's treatment session denying any pain.  Pt able to tolerate increased time with all exercises today.  Pt  denies any pain, but does endorse fatigue at completion of today's treatment session.  Pt challenged by Airex pad balance activities today.  Pt denied any pain at completion of today's treatment session.  OBJECTIVE IMPAIRMENTS: Abnormal gait, decreased activity tolerance, decreased balance, decreased mobility, difficulty walking, and decreased strength.   ACTIVITY LIMITATIONS: standing, squatting, stairs, transfers, and locomotion level  PARTICIPATION LIMITATIONS: meal prep, cleaning, laundry, shopping, and community activity  PERSONAL FACTORS: Time since onset of injury/illness/exacerbation and 3+ comorbidities: HTN, DM, obesity, and OA  are also affecting patient's functional outcome.   REHAB POTENTIAL: Good  CLINICAL DECISION MAKING:  Evolving/moderate complexity  EVALUATION COMPLEXITY: Moderate   GOALS: Goals reviewed with patient? Yes  SHORT TERM GOALS: Target date: 01/06/23 Patient will be independent with his initial HEP.  Baseline: Goal status: INITIAL  2.  Patient will be able to demonstrate improved gait speed to at least 0.8 m/s for improved household mobility.  Baseline:  Goal status: INITIAL  LONG TERM GOALS: Target date: 01/27/23  Patient will be independent with his advanced HEP.  Baseline:  Goal status: INITIAL  2.  Patient will be able to demonstrate improved gait speed to at least 1.0 m/s for improved community mobility. Baseline:  Goal status: INITIAL  3.  Patient will improve his TUG time to 12 seconds or less for improved safety.  Baseline:  Goal status: INITIAL  4.  Patient will improve his five time sit to stand time to 12 seconds or less for improved lower extremity power.  Baseline:  Goal status: INITIAL  PLAN:  PT FREQUENCY: 2x/week  PT DURATION: 6 weeks  PLANNED INTERVENTIONS: Therapeutic exercises, Therapeutic activity, Neuromuscular re-education, Balance training, Gait training, Patient/Family education, Self Care, Stair training, Cryotherapy, Moist heat, and Re-evaluation  PLAN FOR NEXT SESSION: nustep, lower extremity strengthening, and balance interventions   Kathrynn Ducking, PTA 12/26/2022, 11:21 AM

## 2022-12-31 ENCOUNTER — Encounter: Payer: Self-pay | Admitting: Physical Therapy

## 2022-12-31 ENCOUNTER — Ambulatory Visit: Payer: Medicare PPO | Admitting: Physical Therapy

## 2022-12-31 DIAGNOSIS — R2681 Unsteadiness on feet: Secondary | ICD-10-CM

## 2022-12-31 DIAGNOSIS — Z7409 Other reduced mobility: Secondary | ICD-10-CM | POA: Diagnosis not present

## 2022-12-31 DIAGNOSIS — M6281 Muscle weakness (generalized): Secondary | ICD-10-CM | POA: Diagnosis not present

## 2022-12-31 NOTE — Therapy (Signed)
OUTPATIENT PHYSICAL THERAPY LOWER EXTREMITY TREATMENT   Patient Name: Clifford Gibson MRN: [id number] DOB:1946/04/30, 77 y.o., male Today's Date: 12/31/2022  END OF SESSION:  PT End of Session - 12/31/22 1043     Visit Number 4    Number of Visits 12    Date for PT Re-Evaluation 02/28/23    PT Start Time 9518    PT Stop Time 1114    PT Time Calculation (min) 39 min    Activity Tolerance Patient tolerated treatment well    Behavior During Therapy [facility] for tasks assessed/performed            Past Medical History:  Diagnosis Date   Acute serous otitis media 02/22/2010   Arthritis    Diabetes mellitus without complication ([place])    ERECTILE DYSFUNCTION 04/25/2009   GERD 03/24/2009   HYPERLIPIDEMIA 03/28/2010   HYPERTENSION 03/24/2009   OBESITY, MODERATE 04/25/2009   PONV (postoperative nausea and vomiting)    Past Surgical History:  Procedure Laterality Date   COLONOSCOPY N/A 06/21/2015   Procedure: COLONOSCOPY;  Surgeon: Rogene Houston, MD;  [facility];  Service: Endoscopy;  Laterality: N/A;  930 - moved to 7/20 @ 7:30   NASAL SEPTUM SURGERY  1974   TONSILLECTOMY     3 yrs. old   TOTAL KNEE ARTHROPLASTY  06/11/2012   Procedure: TOTAL KNEE ARTHROPLASTY;  Surgeon: Johnn Hai, MD;  Location: [facility];  Service: Orthopedics;  Laterality: Right;   Patient Active Problem List   Diagnosis Date Noted   Mild cognitive impairment 06/14/2022   Rhinorrhea 06/14/2022   Type 2 diabetes mellitus, controlled ([place]) 07/04/2015   Hyperglycemia 02/02/2015   Allergic rhinitis 05/14/2013   Hyperlipemia 03/28/2010   ACUTE SEROUS OTITIS MEDIA 02/22/2010   OBESITY, MODERATE 04/25/2009   ERECTILE DYSFUNCTION 04/25/2009   Essential hypertension 03/24/2009   GERD 03/24/2009   REFERRING PROVIDER: Eulas Post, MD   REFERRING DIAG: Poor mobility   THERAPY DIAG:  Muscle weakness (generalized)  Unsteadiness on feet  Rationale for Evaluation and Treatment:  Rehabilitation  ONSET DATE: November 2023  SUBJECTIVE:   SUBJECTIVE STATEMENT: No AD but no new complaints. Goes for vestibular evaluation 01/08/23.  PERTINENT HISTORY: HTN, DM, obesity, and OA PAIN:  Are you having pain? No  PRECAUTIONS: None  PATIENT GOALS: improved mobility, strength, and balance  NEXT MD VISIT: 12/19/22  OBJECTIVE:                                    EXERCISE LOG  Exercise Repetitions and Resistance Comments  Nustep  Lvl 4 x 20 minutes   Heel raises X20 reps   Hip flexion X20 reps alternating   Hip abduction X20 reps   Forward step up 6" step x20 reps each   Knee extension 10# 3x10 reps   Knee flexion 30# 3x10 reps    ASSESSMENT:  CLINICAL IMPRESSION: Patient presented in clinic with no new complaints. Goes for his vestibular evaluation next week. Patient progressed through LE strengthening with no resistance in standing and moderate VC for technique correction. Patient tolerated machine strengthening well with only complaints of mild fatigue. No AD utilized for ambulation.  OBJECTIVE IMPAIRMENTS: Abnormal gait, decreased activity tolerance, decreased balance, decreased mobility, difficulty walking, and decreased strength.   ACTIVITY LIMITATIONS: standing, squatting, stairs, transfers, and locomotion level  PARTICIPATION LIMITATIONS: meal prep, cleaning, laundry, shopping, and community activity  PERSONAL  FACTORS: Time since onset of injury/illness/exacerbation and 3+ comorbidities: HTN, DM, obesity, and OA  are also affecting patient's functional outcome.   REHAB POTENTIAL: Good  CLINICAL DECISION MAKING: Evolving/moderate complexity  EVALUATION COMPLEXITY: Moderate  GOALS: Goals reviewed with patient? Yes  SHORT TERM GOALS: Target date: 01/06/23 Patient will be independent with his initial HEP.  Baseline: Goal status: INITIAL  2.  Patient will be able to demonstrate improved gait speed to at least 0.8 m/s for improved household mobility.   Baseline:  Goal status: INITIAL  LONG TERM GOALS: Target date: 01/27/23  Patient will be independent with his advanced HEP.  Baseline:  Goal status: INITIAL  2.  Patient will be able to demonstrate improved gait speed to at least 1.0 m/s for improved community mobility. Baseline:  Goal status: INITIAL  3.  Patient will improve his TUG time to 12 seconds or less for improved safety.  Baseline:  Goal status: INITIAL  4.  Patient will improve his five time sit to stand time to 12 seconds or less for improved lower extremity power.  Baseline:  Goal status: INITIAL  PLAN:  PT FREQUENCY: 2x/week  PT DURATION: 6 weeks  PLANNED INTERVENTIONS: Therapeutic exercises, Therapeutic activity, Neuromuscular re-education, Balance training, Gait training, Patient/Family education, Self Care, Stair training, Cryotherapy, Moist heat, and Re-evaluation  PLAN FOR NEXT SESSION: nustep, lower extremity strengthening, and balance interventions  Standley Brooking, PTA 12/31/2022, 11:19 AM

## 2023-01-02 ENCOUNTER — Ambulatory Visit: Payer: Medicare PPO | Attending: Family Medicine

## 2023-01-02 DIAGNOSIS — M6281 Muscle weakness (generalized): Secondary | ICD-10-CM | POA: Insufficient documentation

## 2023-01-02 DIAGNOSIS — R2681 Unsteadiness on feet: Secondary | ICD-10-CM | POA: Insufficient documentation

## 2023-01-02 NOTE — Therapy (Signed)
OUTPATIENT PHYSICAL THERAPY LOWER EXTREMITY TREATMENT   Patient Name: Clifford Gibson MRN: [id number] DOB:02/13/1946, 77 y.o., male Today's Date: 01/02/2023  END OF SESSION:  PT End of Session - 01/02/23 1034     Visit Number 5    Number of Visits 12    Date for PT Re-Evaluation 02/28/23    PT Start Time 1030    PT Stop Time 1113    PT Time Calculation (min) 43 min    Activity Tolerance Patient tolerated treatment well    Behavior During Therapy [facility] for tasks assessed/performed            Past Medical History:  Diagnosis Date   Acute serous otitis media 02/22/2010   Arthritis    Diabetes mellitus without complication ([place])    ERECTILE DYSFUNCTION 04/25/2009   GERD 03/24/2009   HYPERLIPIDEMIA 03/28/2010   HYPERTENSION 03/24/2009   OBESITY, MODERATE 04/25/2009   PONV (postoperative nausea and vomiting)    Past Surgical History:  Procedure Laterality Date   COLONOSCOPY N/A 06/21/2015   Procedure: COLONOSCOPY;  Surgeon: Rogene Houston, MD;  [facility];  Service: Endoscopy;  Laterality: N/A;  930 - moved to 7/20 @ 7:30   NASAL SEPTUM SURGERY  1974   TONSILLECTOMY     3 yrs. old   TOTAL KNEE ARTHROPLASTY  06/11/2012   Procedure: TOTAL KNEE ARTHROPLASTY;  Surgeon: Johnn Hai, MD;  [facility];  Service: Orthopedics;  Laterality: Right;   Patient Active Problem List   Diagnosis Date Noted   Mild cognitive impairment 06/14/2022   Rhinorrhea 06/14/2022   Type 2 diabetes mellitus, controlled ([place]) 07/04/2015   Hyperglycemia 02/02/2015   Allergic rhinitis 05/14/2013   Hyperlipemia 03/28/2010   ACUTE SEROUS OTITIS MEDIA 02/22/2010   OBESITY, MODERATE 04/25/2009   ERECTILE DYSFUNCTION 04/25/2009   Essential hypertension 03/24/2009   GERD 03/24/2009   REFERRING PROVIDER: Eulas Post, MD   REFERRING DIAG: Poor mobility   THERAPY DIAG:  Muscle weakness (generalized)  Unsteadiness on feet  Rationale for Evaluation and Treatment:  Rehabilitation  ONSET DATE: November 2023  SUBJECTIVE:   SUBJECTIVE STATEMENT: No AD but no new complaints. Pt reports feeling weak today, but without pain.  PERTINENT HISTORY: HTN, DM, obesity, and OA PAIN:  Are you having pain? No  PRECAUTIONS: None  PATIENT GOALS: improved mobility, strength, and balance  NEXT MD VISIT: 12/19/22  OBJECTIVE:                                    EXERCISE LOG  Exercise Repetitions and Resistance Comments  Nustep  Lvl 4 x 20 minutes   Heel raises X20 reps   Hip flexion    Hip abduction    Forward step up    Knee extension 10# x3.5 mins   Knee flexion 30# x3.5 mins   Seated marches 4# x 25 reps bil   Ball Squeezes X3 mins   Clam Shells X3 mins red   STS     ASSESSMENT:  CLINICAL IMPRESSION: Pt arrives for today's treatment session denying any pain, but does reports BLE weakness.  Pt able to perform 5 STS in 14.34 seconds today which is good progress towards his goal.  Pt was able to meet his TUG goal today as well.  Pt requesting seated exercises today due to increased BLE fatigue.  Pt able to tolerate increased time with Cybex exercises today.  Pt is making good progress towards his goals at this time.  Pt has vestibular rehab appointment schedule on 2/7.    OBJECTIVE IMPAIRMENTS: Abnormal gait, decreased activity tolerance, decreased balance, decreased mobility, difficulty walking, and decreased strength.   ACTIVITY LIMITATIONS: standing, squatting, stairs, transfers, and locomotion level  PARTICIPATION LIMITATIONS: meal prep, cleaning, laundry, shopping, and community activity  PERSONAL FACTORS: Time since onset of injury/illness/exacerbation and 3+ comorbidities: HTN, DM, obesity, and OA  are also affecting patient's functional outcome.   REHAB POTENTIAL: Good  CLINICAL DECISION MAKING: Evolving/moderate complexity  EVALUATION COMPLEXITY: Moderate  GOALS: Goals reviewed with patient? Yes  SHORT TERM GOALS: Target date:  01/06/23 Patient will be independent with his initial HEP.  Baseline: Goal status: MET  2.  Patient will be able to demonstrate improved gait speed to at least 0.8 m/s for improved household mobility.  Baseline:  Goal status: IN PROGRESS  LONG TERM GOALS: Target date: 01/27/23  Patient will be independent with his advanced HEP.  Baseline:  Goal status: IN PROGRESS  2.  Patient will be able to demonstrate improved gait speed to at least 1.0 m/s for improved community mobility. Baseline: 2/1: 10.9 seconds Goal status: MET  3.  Patient will improve his TUG time to 12 seconds or less for improved safety.  Baseline:  Goal status: IN PROGRESS  4.  Patient will improve his five time sit to stand time to 12 seconds or less for improved lower extremity power.  Baseline: 2/1: 14.34 seconds; Goal status: IN PROGRESS  PLAN:  PT FREQUENCY: 2x/week  PT DURATION: 6 weeks  PLANNED INTERVENTIONS: Therapeutic exercises, Therapeutic activity, Neuromuscular re-education, Balance training, Gait training, Patient/Family education, Self Care, Stair training, Cryotherapy, Moist heat, and Re-evaluation  PLAN FOR NEXT SESSION: nustep, lower extremity strengthening, and balance interventions  Kathrynn Ducking, PTA 01/02/2023, 11:13 AM

## 2023-01-06 ENCOUNTER — Ambulatory Visit: Payer: Medicare PPO

## 2023-01-06 DIAGNOSIS — M6281 Muscle weakness (generalized): Secondary | ICD-10-CM

## 2023-01-06 DIAGNOSIS — R2681 Unsteadiness on feet: Secondary | ICD-10-CM

## 2023-01-06 NOTE — Therapy (Signed)
OUTPATIENT PHYSICAL THERAPY LOWER EXTREMITY TREATMENT   Patient Name: Clifford Gibson MRN: [id number] DOB:18-May-1946, 77 y.o., male Today's Date: 01/06/2023  END OF SESSION:  PT End of Session - 01/06/23 1029     Visit Number 6    Number of Visits 12    Date for PT Re-Evaluation 02/28/23    PT Start Time 1029    PT Stop Time 1116    PT Time Calculation (min) 47 min    Activity Tolerance Patient tolerated treatment well    Behavior During Therapy [facility] for tasks assessed/performed            Past Medical History:  Diagnosis Date   Acute serous otitis media 02/22/2010   Arthritis    Diabetes mellitus without complication ([place])    ERECTILE DYSFUNCTION 04/25/2009   GERD 03/24/2009   HYPERLIPIDEMIA 03/28/2010   HYPERTENSION 03/24/2009   OBESITY, MODERATE 04/25/2009   PONV (postoperative nausea and vomiting)    Past Surgical History:  Procedure Laterality Date   COLONOSCOPY N/A 06/21/2015   Procedure: COLONOSCOPY;  Surgeon: Rogene Houston, MD;  [facility];  Service: Endoscopy;  Laterality: N/A;  930 - moved to 7/20 @ 7:30   NASAL SEPTUM SURGERY  1974   TONSILLECTOMY     3 yrs. old   TOTAL KNEE ARTHROPLASTY  06/11/2012   Procedure: TOTAL KNEE ARTHROPLASTY;  Surgeon: Johnn Hai, MD;  [facility];  Service: Orthopedics;  Laterality: Right;   Patient Active Problem List   Diagnosis Date Noted   Mild cognitive impairment 06/14/2022   Rhinorrhea 06/14/2022   Type 2 diabetes mellitus, controlled ([place]) 07/04/2015   Hyperglycemia 02/02/2015   Allergic rhinitis 05/14/2013   Hyperlipemia 03/28/2010   ACUTE SEROUS OTITIS MEDIA 02/22/2010   OBESITY, MODERATE 04/25/2009   ERECTILE DYSFUNCTION 04/25/2009   Essential hypertension 03/24/2009   GERD 03/24/2009   REFERRING PROVIDER: Eulas Post, MD   REFERRING DIAG: Poor mobility   THERAPY DIAG:  Muscle weakness (generalized)  Unsteadiness on feet  Rationale for Evaluation and Treatment:  Rehabilitation  ONSET DATE: November 2023  SUBJECTIVE:   SUBJECTIVE STATEMENT: Patient reports that he feels alright today. He was a little tired after his last appointment, but has not had any problems since last time.   PERTINENT HISTORY: HTN, DM, obesity, and OA PAIN:  Are you having pain? No  PRECAUTIONS: None  PATIENT GOALS: improved mobility, strength, and balance  NEXT MD VISIT: 12/19/22  OBJECTIVE:                                    2/5 EXERCISE LOG  Exercise Repetitions and Resistance Comments  Nustep  L4 x 20 minutes   Step up w/ toe tap on 8" step  6" step x 3 minutes Alternating LE to simulate reciprocal pattern   Cybex knee flexion  40# x 30 reps    Cybex knee extension  10# x 3 minutes   Seated hip ADD isometric  3 minutes w/ 5 second hold   Marching on foam 2 minutes        Blank cell = exercise not performed today                                    2/1 EXERCISE LOG  Exercise Repetitions and Resistance Comments  Nustep  Lvl 4 x 20 minutes   Heel raises X20 reps   Hip flexion    Hip abduction    Forward step up    Knee extension 10# x3.5 mins   Knee flexion 30# x3.5 mins   Seated marches 4# x 25 reps bil   Ball Squeezes X3 mins   Clam Shells X3 mins red   STS     ASSESSMENT:  CLINICAL IMPRESSION: Patient was able to make fair improvements with skilled physical therapy. He was able to demonstrate improved objective measures since his initial evaluation. However, he was unable to exceed his goals. Treatment focused on familiar interventions for improved lower extremity strength. He reported feeling a little tired upon the conclusion of treatment. He reported feeling comfortable being discharged from this case at this time.    PHYSICAL THERAPY DISCHARGE SUMMARY  Visits from Start of Care: 6  Current functional level related to goals / functional outcomes: Patient was able to demonstrate improvements, but was unable to meet his goals for improved  gait speed, TUG, and five time sit to stand tests.    Remaining deficits: Lower extremity strength, power, and stability    Education / Equipment: HEP   Patient agrees to discharge. Patient goals were partially met. Patient is being discharged due to  referral to [facility] for dizziness.    OBJECTIVE IMPAIRMENTS: Abnormal gait, decreased activity tolerance, decreased balance, decreased mobility, difficulty walking, and decreased strength.   ACTIVITY LIMITATIONS: standing, squatting, stairs, transfers, and locomotion level  PARTICIPATION LIMITATIONS: meal prep, cleaning, laundry, shopping, and community activity  PERSONAL FACTORS: Time since onset of injury/illness/exacerbation and 3+ comorbidities: HTN, DM, obesity, and OA  are also affecting patient's functional outcome.   REHAB POTENTIAL: Good  CLINICAL DECISION MAKING: Evolving/moderate complexity  EVALUATION COMPLEXITY: Moderate  GOALS: Goals reviewed with patient? Yes  SHORT TERM GOALS: Target date: 01/06/23 Patient will be independent with his initial HEP.  Baseline: Goal status: MET  2.  Patient will be able to demonstrate improved gait speed to at least 0.8 m/s for improved household mobility.  Baseline: 0.77 m/s  Goal status: IN PROGRESS  LONG TERM GOALS: Target date: 01/27/23  Patient will be independent with his advanced HEP.  Baseline:  Goal status: MET  2.  Patient will be able to demonstrate improved gait speed to at least 1.0 m/s for improved community mobility. Baseline: 0.77 m/s  Goal status: IN PROGRESS  3.  Patient will improve his TUG time to 12 seconds or less for improved safety.  Baseline: 12.80 seconds Goal status: IN PROGRESS  4.  Patient will improve his five time sit to stand time to 12 seconds or less for improved lower extremity power.  Baseline: 2/1: 14.34 seconds; 9.85 second w/ UE support and 16.33 seconds w/o UE spupport Goal status: IN PROGRESS  PLAN:  PT FREQUENCY: 2x/week  PT  DURATION: 6 weeks  PLANNED INTERVENTIONS: Therapeutic exercises, Therapeutic activity, Neuromuscular re-education, Balance training, Gait training, Patient/Family education, Self Care, Stair training, Cryotherapy, Moist heat, and Re-evaluation  PLAN FOR NEXT SESSION: nustep, lower extremity strengthening, and balance interventions  Darlin Coco, PT 01/06/2023, 1:05 PM

## 2023-01-08 ENCOUNTER — Other Ambulatory Visit: Payer: Self-pay

## 2023-01-08 ENCOUNTER — Ambulatory Visit (HOSPITAL_COMMUNITY): Payer: Medicare PPO | Attending: Otolaryngology

## 2023-01-08 DIAGNOSIS — M6281 Muscle weakness (generalized): Secondary | ICD-10-CM | POA: Diagnosis not present

## 2023-01-08 DIAGNOSIS — R42 Dizziness and giddiness: Secondary | ICD-10-CM | POA: Diagnosis not present

## 2023-01-08 DIAGNOSIS — R2681 Unsteadiness on feet: Secondary | ICD-10-CM | POA: Insufficient documentation

## 2023-01-08 NOTE — Therapy (Signed)
OUTPATIENT PHYSICAL THERAPY VESTIBULAR EVALUATION     Patient Name: Clifford Gibson MRN: [id number] DOB:[birth year], 77 y.o., male Today's Date: 01/08/2023  END OF SESSION:  PT End of Session - 01/08/23 1650     Visit Number 1    Number of Visits 16    Date for PT Re-Evaluation 03/05/23    Authorization Type [employer] (requested 16 visits, please check)    Progress Note Due on Visit 8    PT Start Time 1515    PT Stop Time 1600    PT Time Calculation (min) 45 min    Behavior During Therapy [facility] for tasks assessed/performed            Past Medical History:  Diagnosis Date   Acute serous otitis media 02/22/2010   Arthritis    Diabetes mellitus without complication ([place])    ERECTILE DYSFUNCTION 04/25/2009   GERD 03/24/2009   HYPERLIPIDEMIA 03/28/2010   HYPERTENSION 03/24/2009   OBESITY, MODERATE 04/25/2009   PONV (postoperative nausea and vomiting)    Past Surgical History:  Procedure Laterality Date   COLONOSCOPY N/A 06/21/2015   Procedure: COLONOSCOPY;  Surgeon: Rogene Houston, MD;  [facility];  Service: Endoscopy;  Laterality: N/A;  930 - moved to 7/20 @ 7:30   NASAL SEPTUM SURGERY  1974   TONSILLECTOMY     3 yrs. old   TOTAL KNEE ARTHROPLASTY  06/11/2012   Procedure: TOTAL KNEE ARTHROPLASTY;  Surgeon: Johnn Hai, MD;  Location: [facility];  Service: Orthopedics;  Laterality: Right;   Patient Active Problem List   Diagnosis Date Noted   Mild cognitive impairment 06/14/2022   Rhinorrhea 06/14/2022   Type 2 diabetes mellitus, controlled ([place]) 07/04/2015   Hyperglycemia 02/02/2015   Allergic rhinitis 05/14/2013   Hyperlipemia 03/28/2010   ACUTE SEROUS OTITIS MEDIA 02/22/2010   OBESITY, MODERATE 04/25/2009   ERECTILE DYSFUNCTION 04/25/2009   Essential hypertension 03/24/2009   GERD 03/24/2009    PCP: Eulas Post, MD  REFERRING PROVIDER: Leta Baptist  REFERRING DIAG: dizziness  THERAPY DIAG:  Dizziness and giddiness  Unsteadiness on  feet  ONSET DATE: Years ago  Rationale for Evaluation and Treatment: Rehabilitation  SUBJECTIVE:   SUBJECTIVE STATEMENT: Denies dizziness at the moment. However, patient states that when he moves fast (e.g. turning to the L/R), he gets dizzy. Patient denies any spinning sensation or lightheadedness. However, laying on his L side makes the room "spin". Condition started years ago. Cannot associate any precipitating event but patient claims that he's prone to frequent sinus infections and were treated by antiobiotics in the past which addressed the infection.Patient recalls that when he was 4, he fell and hit his head at that time which made him dizzy but the dizziness went away. Last month, patient had a test about his dizziness and was told by his physician that the issue is not coming from the inner ear but on the brain itself. Patient was then referred to outpatient PT evaluation and management for dizziness. Patient was just recently D/C from another outpatient PT where he was seen for LE weakness. Pt accompanied by: self  PERTINENT HISTORY: R TKA, recurrent sinus infections, LE weakness  PAIN:  Are you having pain? None  PRECAUTIONS: None  WEIGHT BEARING RESTRICTIONS: No  FALLS: Has patient fallen in last 6 months? No  LIVING ENVIRONMENT: Lives with: lives with their spouse Lives in: House/apartment Stairs: No Has following equipment at home: Lobbyist, Environmental consultant - 2 wheeled,  Shower bench, and Grab bars  PLOF: Independent  PATIENT GOALS: "I want something that would help me compensate and train this portion of the brain so I will be less dizzy"  OBJECTIVE:   DIAGNOSTIC FINDINGS:   MR Brain WO Contrast 06/17/2022 IMPRESSION: 1. Mildly motion degraded exam. 2. No evidence of acute intracranial abnormality. 3. Mild-to-moderate chronic small vessel image changes cerebral white matter, stable from the recent prior MRI of 05/11/2022. 4. Mild-to-moderate generalized  cerebral atrophy. Comparatively mild cerebellar atrophy. 5. Paranasal sinus disease, as described. 6. Small-volume fluid within the bilateral mastoid air cells.  COGNITION: Overall cognitive status: Within functional limits for tasks assessed   SENSATION: Not tested  POSTURE:  rounded shoulders and forward head  Cervical ROM:    Active A/PROM (deg) eval  Flexion WFL  Extension 25%  Right lateral flexion   Left lateral flexion   Right rotation 50%  Left rotation 5)%  (Blank rows = not tested)  LOWER EXTREMITY MMT:   MMT Right eval Left eval  Hip flexion 4+ 4+  Hip abduction 4+ 4+  Hip adduction    Hip internal rotation    Hip external rotation    Knee flexion 4+ 4+  Knee extension 4+ 4+  Ankle dorsiflexion 4+ 4+  Ankle plantarflexion 4+ 4+  Ankle inversion    Ankle eversion    (Blank rows = not tested)  BED MOBILITY:  Sit to supine Complete Independence  TRANSFERS: Assistive device utilized: None  Sit to stand: Complete Independence Stand to sit: Complete Independence  GAIT: Gait pattern: step through pattern, decreased arm swing- Right, decreased arm swing- Left, decreased stride length, decreased hip/knee flexion- Right, decreased hip/knee flexion- Left, and narrow BOS Distance walked: 20 Assistive device utilized: None Level of assistance: Complete Independence   FUNCTIONAL TESTS:  DGI 1. Gait level surface (2) Mild Impairment: Walks 20', uses assistive devices, slower speed, mild gait deviations. 2. Change in gait speed (2) Mild Impairment: Is able to change speed but demonstrates mild gait deviations, or not gait deviations but unable to achieve a significant change in velocity, or uses an assistive device. 3. Gait with horizontal head turns (1) Moderate Impairment: Performs head turns with moderate change in gait velocity, slows down, staggers but recovers, can continue to walk. 4. Gait with vertical head turns 1) Moderate Impairment: Performs  head turns with moderate change in gait velocity, slows down, staggers but recovers, can continue to walk. 5. Gait and pivot turn (2) Mild Impairment: Pivot turns safely in > 3 seconds and stops with no loss of balance. 6. Step over obstacle (1) Moderate Impairment: Is able to step over box but must stop, then step over. May require verbal cueing. 7. Step around obstacles (2) Mild Impairment: Is able to step around both cones, but must slow down and adjust steps to clear cones. 8. Stairs (2) Mild Impairment: Alternating feet, must use rail.  TOTAL SCORE: 13 / 24   VESTIBULAR ASSESSMENT:  SYMPTOM BEHAVIOR:  Type of dizziness: Blurred Vision, Imbalance (Disequilibrium), Unsteady with head/body turns, and "Funny feeling in the head"  Aggravating factors: Induced by motion: turning body quickly, turning head quickly, and sitting in a moving car and Worse outside or in busy environment  Relieving factors: rest  Progression of symptoms: unchanged  OCULOMOTOR EXAM:  Ocular Alignment: normal  Ocular ROM: No Limitations  Spontaneous Nystagmus: absent  Gaze-Induced Nystagmus: absent  Smooth Pursuits: intact  Saccades: hypometric/undershoots  Intact CN 3,4,6 on B  FRENZEL -  FIXATION SUPRESSED: Positional tests: Right Dix-Hallpike: no nystagmus Left Dix-Hallpike: no nystagmus observed but reported of "spinning sensation" (vertigo) Right Roll Test: no nystagmus Left Roll Test: no nystagmus but reported vertigo    VESTIBULAR - OCULAR REFLEX:   VOR Cancellation: Corrective Saccades  Head-Impulse Test: HIT Right: positive HIT Left: positive VOR 1 (horizontal): impaired, dizziness reported    MOTION SENSITIVITY:  Motion Sensitivity Quotient Intensity: 0 = none, 1 = Lightheaded, 2 = Mild, 3 = Moderate, 4 = Severe, 5 = Vomiting  Intensity  1. Sitting to supine   2. Supine to L side   3. Supine to R side   4. Supine to sitting   5. L Hallpike-Dix   6. Up from L    7. R Hallpike-Dix    8. Up from R    9. Sitting, head tipped to L knee   10. Head up from L knee   11. Sitting, head tipped to R knee   12. Head up from R knee   13. Sitting head turns x5   14.Sitting head nods x5   15. In stance, 180 turn to L  3  16. In stance, 180 turn to R 3    (Blank rows = not tested) (+) Rhomberg test (-) Active VBI test on B  VESTIBULAR TREATMENT:                                                                                                   DATE: 01/08/23 Evaluation and patient education done  PATIENT EDUCATION: Education details: Educated on the pathoanatomy of vestibular hypofunction. Educated on the goals and course of rehab. Written HEP provided and reviewed Person educated: Patient Education method: Explanation Education comprehension: verbalized understanding  HOME EXERCISE PROGRAM:  None given to date  GOALS: Goals reviewed with patient? No  SHORT TERM GOALS: Target date: 02/05/23  Pt will demonstrate indep in HEP to facilitate carry-over of skilled services and improve functional outcomes  Goal status: INITIAL  2.  Pt will demonstrate a negative L Dix-Hallpike to facilitate ease in bed mobility Baseline: positive Goal status: INITIAL   LONG TERM GOALS: Target date: 03/05/23  Pt will improve Motion sensitivity quotient to 0 (turning in standing) in order to demonstrate clinically significant improvement in balance and decreased risk for falls Baseline: 3 Goal status: INITIAL  2.  Pt will improve DGI by at least 3 points in order to demonstrate clinically significant improvement in balance and decreased risk for falls  Baseline: 13 Goal status: INITIAL  ASSESSMENT:  CLINICAL IMPRESSION: Patient is a 77 y.o. male who was seen today for physical therapy evaluation and treatment for dizziness. Patient was diagnosed with dizziness by referring provider further defined by difficulty with turning due to impaired vestibular function and impaired proprioception.  Based on the tests above, patient presents with a possible L post/horizontal BPPV and a possible vestibular hypofunction. Skilled PT is required to address the impairments and functional limitations listed below.  OBJECTIVE IMPAIRMENTS: Abnormal gait, decreased balance, difficulty walking, dizziness, and impaired flexibility.   ACTIVITY LIMITATIONS: standing and bed  mobility  PARTICIPATION LIMITATIONS: community activity  PERSONAL FACTORS: Age and Fitness are also affecting patient's functional outcome.   REHAB POTENTIAL: Fair    CLINICAL DECISION MAKING: Evolving/moderate complexity  EVALUATION COMPLEXITY: Moderate   PLAN:  PT FREQUENCY: 2x/week  PT DURATION: 8 weeks  PLANNED INTERVENTIONS: Therapeutic exercises, Therapeutic activity, Neuromuscular re-education, Balance training, Gait training, Patient/Family education, Self Care, Vestibular training, and Canalith repositioning  PLAN FOR NEXT SESSION: Provide HEP   Iantha Fallen L. Yassmin Binegar, PT, DPT, OCS Board-Certified Clinical Specialist in Orthopedic PT PT Compact Privilege # ([place]): [id number] T 01/08/2023, 4:54 PM

## 2023-01-10 ENCOUNTER — Encounter: Payer: Medicare PPO | Admitting: Psychology

## 2023-01-13 ENCOUNTER — Ambulatory Visit (HOSPITAL_COMMUNITY): Payer: Medicare PPO | Admitting: Physical Therapy

## 2023-01-13 DIAGNOSIS — R42 Dizziness and giddiness: Secondary | ICD-10-CM

## 2023-01-13 DIAGNOSIS — M6281 Muscle weakness (generalized): Secondary | ICD-10-CM | POA: Diagnosis not present

## 2023-01-13 DIAGNOSIS — R2681 Unsteadiness on feet: Secondary | ICD-10-CM | POA: Diagnosis not present

## 2023-01-13 NOTE — Therapy (Signed)
OUTPATIENT PHYSICAL THERAPY VESTIBULAR EVALUATION     Patient Name: Clifford Gibson MRN: [id number] DOB:Jul 22, 1946, 77 y.o., male Today's Date: 01/13/2023  END OF SESSION:  PT End of Session - 01/13/23 1035     Visit Number 2    Number of Visits 16    Date for PT Re-Evaluation 03/05/23    Authorization Type [employer] (requested 16 visits, please check)    Authorization Time Period pending    Progress Note Due on Visit 8    PT Start Time 1033    PT Stop Time 1111    PT Time Calculation (min) 38 min    Activity Tolerance Patient tolerated treatment well    Behavior During Therapy [facility] for tasks assessed/performed            Past Medical History:  Diagnosis Date   Acute serous otitis media 02/22/2010   Arthritis    Diabetes mellitus without complication ([place])    ERECTILE DYSFUNCTION 04/25/2009   GERD 03/24/2009   HYPERLIPIDEMIA 03/28/2010   HYPERTENSION 03/24/2009   OBESITY, MODERATE 04/25/2009   PONV (postoperative nausea and vomiting)    Past Surgical History:  Procedure Laterality Date   COLONOSCOPY N/A 06/21/2015   Procedure: COLONOSCOPY;  Surgeon: Rogene Houston, MD;  [facility];  Service: Endoscopy;  Laterality: N/A;  930 - moved to 7/20 @ 7:30   NASAL SEPTUM SURGERY  1974   TONSILLECTOMY     3 yrs. old   TOTAL KNEE ARTHROPLASTY  06/11/2012   Procedure: TOTAL KNEE ARTHROPLASTY;  Surgeon: Johnn Hai, MD;  Location: [facility];  Service: Orthopedics;  Laterality: Right;   Patient Active Problem List   Diagnosis Date Noted   Mild cognitive impairment 06/14/2022   Rhinorrhea 06/14/2022   Type 2 diabetes mellitus, controlled ([place]) 07/04/2015   Hyperglycemia 02/02/2015   Allergic rhinitis 05/14/2013   Hyperlipemia 03/28/2010   ACUTE SEROUS OTITIS MEDIA 02/22/2010   OBESITY, MODERATE 04/25/2009   ERECTILE DYSFUNCTION 04/25/2009   Essential hypertension 03/24/2009   GERD 03/24/2009    PCP: Eulas Post, MD  REFERRING PROVIDER: Leta Baptist  REFERRING DIAG: dizziness  THERAPY DIAG:  Dizziness and giddiness  ONSET DATE: Years ago  Rationale for Evaluation and Treatment: Rehabilitation  SUBJECTIVE:   SUBJECTIVE STATEMENT: Stopped taking his medicine and had a flare up Saturday night. Felt better by the next day.    Denies dizziness at the moment. However, patient states that when he moves fast (e.g. turning to the L/R), he gets dizzy. Patient denies any spinning sensation or lightheadedness. However, laying on his L side makes the room "spin". Condition started years ago. Cannot associate any precipitating event but patient claims that he's prone to frequent sinus infections and were treated by antiobiotics in the past which addressed the infection.Patient recalls that when he was 4, he fell and hit his head at that time which made him dizzy but the dizziness went away. Last month, patient had a test about his dizziness and was told by his physician that the issue is not coming from the inner ear but on the brain itself. Patient was then referred to outpatient PT evaluation and management for dizziness. Patient was just recently D/C from another outpatient PT where he was seen for LE weakness. Pt accompanied by: self  PERTINENT HISTORY: R TKA, recurrent sinus infections, LE weakness  PAIN:  Are you having pain? None  PRECAUTIONS: None  WEIGHT BEARING RESTRICTIONS: No  FALLS: Has patient fallen  in last 6 months? No  LIVING ENVIRONMENT: Lives with: lives with their spouse Lives in: House/apartment Stairs: No Has following equipment at home: Lobbyist, Environmental consultant - 2 wheeled, Electronics engineer, and Grab bars  PLOF: Independent  PATIENT GOALS: "I want something that would help me compensate and train this portion of the brain so I will be less dizzy"  OBJECTIVE:   DIAGNOSTIC FINDINGS:   MR Brain WO Contrast 06/17/2022 IMPRESSION: 1. Mildly motion degraded exam. 2. No evidence of acute intracranial  abnormality. 3. Mild-to-moderate chronic small vessel image changes cerebral white matter, stable from the recent prior MRI of 05/11/2022. 4. Mild-to-moderate generalized cerebral atrophy. Comparatively mild cerebellar atrophy. 5. Paranasal sinus disease, as described. 6. Small-volume fluid within the bilateral mastoid air cells.  COGNITION: Overall cognitive status: Within functional limits for tasks assessed   SENSATION: Not tested  POSTURE:  rounded shoulders and forward head  Cervical ROM:    Active A/PROM (deg) eval  Flexion WFL  Extension 25%  Right lateral flexion   Left lateral flexion   Right rotation 50%  Left rotation 5)%  (Blank rows = not tested)  LOWER EXTREMITY MMT:   MMT Right eval Left eval  Hip flexion 4+ 4+  Hip abduction 4+ 4+  Hip adduction    Hip internal rotation    Hip external rotation    Knee flexion 4+ 4+  Knee extension 4+ 4+  Ankle dorsiflexion 4+ 4+  Ankle plantarflexion 4+ 4+  Ankle inversion    Ankle eversion    (Blank rows = not tested)  BED MOBILITY:  Sit to supine Complete Independence  TRANSFERS: Assistive device utilized: None  Sit to stand: Complete Independence Stand to sit: Complete Independence  GAIT: Gait pattern: step through pattern, decreased arm swing- Right, decreased arm swing- Left, decreased stride length, decreased hip/knee flexion- Right, decreased hip/knee flexion- Left, and narrow BOS Distance walked: 20 Assistive device utilized: None Level of assistance: Complete Independence   FUNCTIONAL TESTS:  DGI 1. Gait level surface (2) Mild Impairment: Walks 20', uses assistive devices, slower speed, mild gait deviations. 2. Change in gait speed (2) Mild Impairment: Is able to change speed but demonstrates mild gait deviations, or not gait deviations but unable to achieve a significant change in velocity, or uses an assistive device. 3. Gait with horizontal head turns (1) Moderate Impairment: Performs  head turns with moderate change in gait velocity, slows down, staggers but recovers, can continue to walk. 4. Gait with vertical head turns 1) Moderate Impairment: Performs head turns with moderate change in gait velocity, slows down, staggers but recovers, can continue to walk. 5. Gait and pivot turn (2) Mild Impairment: Pivot turns safely in > 3 seconds and stops with no loss of balance. 6. Step over obstacle (1) Moderate Impairment: Is able to step over box but must stop, then step over. May require verbal cueing. 7. Step around obstacles (2) Mild Impairment: Is able to step around both cones, but must slow down and adjust steps to clear cones. 8. Stairs (2) Mild Impairment: Alternating feet, must use rail.  TOTAL SCORE: 13 / 24   VESTIBULAR ASSESSMENT:  SYMPTOM BEHAVIOR:  Type of dizziness: Blurred Vision, Imbalance (Disequilibrium), Unsteady with head/body turns, and "Funny feeling in the head"  Aggravating factors: Induced by motion: turning body quickly, turning head quickly, and sitting in a moving car and Worse outside or in busy environment  Relieving factors: rest  Progression of symptoms: unchanged  OCULOMOTOR EXAM:  Ocular Alignment: normal  Ocular ROM: No Limitations  Spontaneous Nystagmus: absent  Gaze-Induced Nystagmus: absent  Smooth Pursuits: intact  Saccades: hypometric/undershoots  Intact CN 3,4,6 on B  FRENZEL - FIXATION SUPRESSED: Positional tests: Right Dix-Hallpike: no nystagmus Left Dix-Hallpike: no nystagmus observed but reported of "spinning sensation" (vertigo) Right Roll Test: no nystagmus Left Roll Test: no nystagmus but reported vertigo    VESTIBULAR - OCULAR REFLEX:   VOR Cancellation: Corrective Saccades  Head-Impulse Test: HIT Right: positive HIT Left: positive VOR 1 (horizontal): impaired, dizziness reported    MOTION SENSITIVITY:  Motion Sensitivity Quotient Intensity: 0 = none, 1 = Lightheaded, 2 = Mild, 3 = Moderate, 4 = Severe, 5  = Vomiting  Intensity  1. Sitting to supine   2. Supine to L side   3. Supine to R side   4. Supine to sitting   5. L Hallpike-Dix   6. Up from L    7. R Hallpike-Dix   8. Up from R    9. Sitting, head tipped to L knee   10. Head up from L knee   11. Sitting, head tipped to R knee   12. Head up from R knee   13. Sitting head turns x5   14.Sitting head nods x5   15. In stance, 180 turn to L  3  16. In stance, 180 turn to R 3    (Blank rows = not tested) (+) Rhomberg test (-) Active VBI test on B  VESTIBULAR TREATMENT:                                                                                                   DATE:  01/13/23 LT BBQ roll for horizontal canal per previous assessment (reports mild dizziness initially but no nystagmus)  -poor transition from supine to LT sidelying   Seated head turns x 10 (no sx) Seated head nods x10 (no sx)  Seated VOR 1 horizontal x10 (mild dizziness, difficulty tracking to LT)   01/08/23 Evaluation and patient education done  PATIENT EDUCATION: Education details: Educated on the pathoanatomy of vestibular hypofunction. Educated on the goals and course of rehab. Written HEP provided and reviewed Person educated: Patient Education method: Explanation Education comprehension: verbalized understanding  HOME EXERCISE PROGRAM:   Access Code: [email or web address] URL: [email or web address] Date: 01/13/2023 Prepared by: Josue Hector  Exercises - Seated Gaze Stabilization with Head Rotation  - 3 x daily - 7 x weekly - 3 sets - 10 reps - Seated Left Head Turns Vestibular Habituation  - 3 x daily - 7 x weekly - 3 sets - 10 reps  GOALS: Goals reviewed with patient? No  SHORT TERM GOALS: Target date: 02/05/23  Pt will demonstrate indep in HEP to facilitate carry-over of skilled services and improve functional outcomes  Goal status: INITIAL  2.  Pt will demonstrate a negative L Dix-Hallpike to facilitate ease in bed  mobility Baseline: positive Goal status: INITIAL   LONG TERM GOALS: Target date: 03/05/23  Pt will improve Motion sensitivity quotient to 0 (turning in standing) in order to demonstrate clinically significant  improvement in balance and decreased risk for falls Baseline: 3 Goal status: INITIAL  2.  Pt will improve DGI by at least 3 points in order to demonstrate clinically significant improvement in balance and decreased risk for falls  Baseline: 13 Goal status: INITIAL  ASSESSMENT:  CLINICAL IMPRESSION: Continued vestibular assessment. As noted above, performed log roll testing for LT horizontal canal BPPV. Mild dizziness reported, lasting about 3-5 seconds, but no observed nystagmus. Continued with seated vestibular habituation progressions. Patient educated on purpose and function of added activity. Answered all patient questions and issued HEP. Patient will continue to benefit from skilled therapy services to reduce remaining deficits and improve functional ability.    OBJECTIVE IMPAIRMENTS: Abnormal gait, decreased balance, difficulty walking, dizziness, and impaired flexibility.   ACTIVITY LIMITATIONS: standing and bed mobility  PARTICIPATION LIMITATIONS: community activity  PERSONAL FACTORS: Age and Fitness are also affecting patient's functional outcome.   REHAB POTENTIAL: Fair    CLINICAL DECISION MAKING: Evolving/moderate complexity  EVALUATION COMPLEXITY: Moderate   PLAN:  PT FREQUENCY: 2x/week  PT DURATION: 8 weeks  PLANNED INTERVENTIONS: Therapeutic exercises, Therapeutic activity, Neuromuscular re-education, Balance training, Gait training, Patient/Family education, Self Care, Vestibular training, and Canalith repositioning  PLAN FOR NEXT SESSION: Progress vestibular habituation, seated to standing as tolerated.   11:12 AM, 01/13/23 Josue Hector PT DPT  Physical Therapist with [facility]  [phone]

## 2023-01-16 ENCOUNTER — Encounter (HOSPITAL_COMMUNITY): Payer: Medicare PPO

## 2023-01-16 ENCOUNTER — Encounter: Payer: Medicare PPO | Admitting: Psychology

## 2023-01-17 ENCOUNTER — Encounter: Payer: Medicare PPO | Admitting: Psychology

## 2023-01-20 ENCOUNTER — Ambulatory Visit (INDEPENDENT_AMBULATORY_CARE_PROVIDER_SITE_OTHER): Payer: Medicare PPO

## 2023-01-20 VITALS — Ht 68.0 in | Wt 215.0 lb

## 2023-01-20 DIAGNOSIS — Z0000 Encounter for general adult medical examination without abnormal findings: Secondary | ICD-10-CM | POA: Diagnosis not present

## 2023-01-20 NOTE — Patient Instructions (Addendum)
Mr. Clifford Gibson , Thank you for taking time to come for your Medicare Wellness Visit. I appreciate your ongoing commitment to your health goals. Please review the following plan we discussed and let me know if I can assist you in the future.   These are the goals we discussed:  Goals       DIET - INCREASE WATER INTAKE (pt-stated)      Increase water intake to 6-8 glasses daily       Increase physical activity      Goal of physical activity 5 times a week and 30 minutes each episode      Lose weight (pt-stated)      I would liketo lose about 15 pounds.      Manage My Medicine      Timeframe:  Long-Range Goal Priority:  Medium Start Date:                             Expected End Date:                       Follow Up Date 08/13/22    - keep a list of all the medicines I take; vitamins and herbals too - use an alarm clock or phone to remind me to take my medicine  -Consider use of a pillbox or alternative method as a second check for taking medications (flipping bottles over once you take them, marking your calendar, etc.)   Why is this important?   These steps will help you keep on track with your medicines.   Notes:       Monitor and Manage My Blood Sugar-Diabetes Type 2      Timeframe:  Long-Range Goal Priority:  High Start Date:                             Expected End Date:                       Follow Up Date 08/13/22    - check blood sugar if I feel it is too high or too low - take the blood sugar log to all doctor visits -check blood sugar 2 times a week    Why is this important?   Checking your blood sugar at home helps to keep it from getting very high or very low.  Writing the results in a diary or log helps the doctor know how to care for you.  Your blood sugar log should have the time, date and the results.  Also, write down the amount of insulin or other medicine that you take.  Other information, like what you ate, exercise done and how you were feeling, will  also be helpful.     Notes:       Weight (lb) < 190 lb (86.2 kg)        This is a list of the screening recommended for you and due dates:  Health Maintenance  Topic Date Due   Complete foot exam   01/21/2023*   COVID-19 Vaccine (2 - 2023-24 season) 02/05/2023*   Yearly kidney function blood test for diabetes  04/17/2023   Yearly kidney health urinalysis for diabetes  04/23/2023   Eye exam for diabetics  05/03/2023   Hemoglobin A1C  06/06/2023   Medicare Annual Wellness Visit  01/21/2024   DTaP/Tdap/Td  vaccine (3 - Td or Tdap) 12/01/2028   Pneumonia Vaccine  Completed   Flu Shot  Completed   Hepatitis C Screening: USPSTF Recommendation to screen - Ages 3-79 yo.  Completed   Zoster (Shingles) Vaccine  Completed   HPV Vaccine  Aged Out   Colon Cancer Screening  Discontinued  *Topic was postponed. The date shown is not the original due date.    Advanced directives: Please bring a copy of your health care power of attorney and living will to the office to be added to your chart at your convenience.   Conditions/risks identified: None  Next appointment: Follow up in one year for your annual wellness visit.    Preventive Care 85 Years and Older, Male  Preventive care refers to lifestyle choices and visits with your health care provider that can promote health and wellness. What does preventive care include? A yearly physical exam. This is also called an annual well check. Dental exams once or twice a year. Routine eye exams. Ask your health care provider how often you should have your eyes checked. Personal lifestyle choices, including: Daily care of your teeth and gums. Regular physical activity. Eating a healthy diet. Avoiding tobacco and drug use. Limiting alcohol use. Practicing safe sex. Taking low doses of aspirin every day. Taking vitamin and mineral supplements as recommended by your health care provider. What happens during an annual well check? The services  and screenings done by your health care provider during your annual well check will depend on your age, overall health, lifestyle risk factors, and family history of disease. Counseling  Your health care provider may ask you questions about your: Alcohol use. Tobacco use. Drug use. Emotional well-being. Home and relationship well-being. Sexual activity. Eating habits. History of falls. Memory and ability to understand (cognition). Work and work Statistician. Screening  You may have the following tests or measurements: Height, weight, and BMI. Blood pressure. Lipid and cholesterol levels. These may be checked every 5 years, or more frequently if you are over 34 years old. Skin check. Lung cancer screening. You may have this screening every year starting at age 53 if you have a 30-pack-year history of smoking and currently smoke or have quit within the past 15 years. Fecal occult blood test (FOBT) of the stool. You may have this test every year starting at age 76. Flexible sigmoidoscopy or colonoscopy. You may have a sigmoidoscopy every 5 years or a colonoscopy every 10 years starting at age 69. Prostate cancer screening. Recommendations will vary depending on your family history and other risks. Hepatitis C blood test. Hepatitis B blood test. Sexually transmitted disease (STD) testing. Diabetes screening. This is done by checking your blood sugar (glucose) after you have not eaten for a while (fasting). You may have this done every 1-3 years. Abdominal aortic aneurysm (AAA) screening. You may need this if you are a current or former smoker. Osteoporosis. You may be screened starting at age 74 if you are at high risk. Talk with your health care provider about your test results, treatment options, and if necessary, the need for more tests. Vaccines  Your health care provider may recommend certain vaccines, such as: Influenza vaccine. This is recommended every year. Tetanus, diphtheria,  and acellular pertussis (Tdap, Td) vaccine. You may need a Td booster every 10 years. Zoster vaccine. You may need this after age 75. Pneumococcal 13-valent conjugate (PCV13) vaccine. One dose is recommended after age 54. Pneumococcal polysaccharide (PPSV23) vaccine. One dose is recommended  after age 58. Talk to your health care provider about which screenings and vaccines you need and how often you need them. This information is not intended to replace advice given to you by your health care provider. Make sure you discuss any questions you have with your health care provider. Document Released: 12/15/2015 Document Revised: 08/07/2016 Document Reviewed: 09/19/2015 Elsevier Interactive Patient Education  2017 [place] Prevention in the Home Falls can cause injuries. They can happen to people of all ages. There are many things you can do to make your home safe and to help prevent falls. What can I do on the outside of my home? Regularly fix the edges of walkways and driveways and fix any cracks. Remove anything that might make you trip as you walk through a door, such as a raised step or threshold. Trim any bushes or trees on the path to your home. Use bright outdoor lighting. Clear any walking paths of anything that might make someone trip, such as rocks or tools. Regularly check to see if handrails are loose or broken. Make sure that both sides of any steps have handrails. Any raised decks and porches should have guardrails on the edges. Have any leaves, snow, or ice cleared regularly. Use sand or salt on walking paths during winter. Clean up any spills in your garage right away. This includes oil or grease spills. What can I do in the bathroom? Use night lights. Install grab bars by the toilet and in the tub and shower. Do not use towel bars as grab bars. Use non-skid mats or decals in the tub or shower. If you need to sit down in the shower, use a plastic, non-slip  stool. Keep the floor dry. Clean up any water that spills on the floor as soon as it happens. Remove soap buildup in the tub or shower regularly. Attach bath mats securely with double-sided non-slip rug tape. Do not have throw rugs and other things on the floor that can make you trip. What can I do in the bedroom? Use night lights. Make sure that you have a light by your bed that is easy to reach. Do not use any sheets or blankets that are too big for your bed. They should not hang down onto the floor. Have a firm chair that has side arms. You can use this for support while you get dressed. Do not have throw rugs and other things on the floor that can make you trip. What can I do in the kitchen? Clean up any spills right away. Avoid walking on wet floors. Keep items that you use a lot in easy-to-reach places. If you need to reach something above you, use a strong step stool that has a grab bar. Keep electrical cords out of the way. Do not use floor polish or wax that makes floors slippery. If you must use wax, use non-skid floor wax. Do not have throw rugs and other things on the floor that can make you trip. What can I do with my stairs? Do not leave any items on the stairs. Make sure that there are handrails on both sides of the stairs and use them. Fix handrails that are broken or loose. Make sure that handrails are as long as the stairways. Check any carpeting to make sure that it is firmly attached to the stairs. Fix any carpet that is loose or worn. Avoid having throw rugs at the top or bottom of the stairs. If you do  have throw rugs, attach them to the floor with carpet tape. Make sure that you have a light switch at the top of the stairs and the bottom of the stairs. If you do not have them, ask someone to add them for you. What else can I do to help prevent falls? Wear shoes that: Do not have high heels. Have rubber bottoms. Are comfortable and fit you well. Are closed at the  toe. Do not wear sandals. If you use a stepladder: Make sure that it is fully opened. Do not climb a closed stepladder. Make sure that both sides of the stepladder are locked into place. Ask someone to hold it for you, if possible. Clearly mark and make sure that you can see: Any grab bars or handrails. First and last steps. Where the edge of each step is. Use tools that help you move around (mobility aids) if they are needed. These include: Canes. Walkers. Scooters. Crutches. Turn on the lights when you go into a dark area. Replace any light bulbs as soon as they burn out. Set up your furniture so you have a clear path. Avoid moving your furniture around. If any of your floors are uneven, fix them. If there are any pets around you, be aware of where they are. Review your medicines with your doctor. Some medicines can make you feel dizzy. This can increase your chance of falling. Ask your doctor what other things that you can do to help prevent falls. This information is not intended to replace advice given to you by your health care provider. Make sure you discuss any questions you have with your health care provider. Document Released: 09/14/2009 Document Revised: 04/25/2016 Document Reviewed: 12/23/2014 Elsevier Interactive Patient Education  2017 [employer].

## 2023-01-20 NOTE — Progress Notes (Signed)
Subjective:   Clifford Gibson is a 77 y.o. male who presents for Medicare Annual/Subsequent preventive examination.  Review of Systems    Virtual Visit via Telephone Note  I connected with  Clifford Gibson on 01/20/23 at  2:15 PM EST by telephone and verified that I am speaking with the correct person using two identifiers.  Location: Patient: Home Provider: Office Persons participating in the virtual visit: patient/Nurse Health Advisor   I discussed the limitations, risks, security and privacy concerns of performing an evaluation and management service by telephone and the availability of in person appointments. The patient expressed understanding and agreed to proceed.  Interactive audio and video telecommunications were attempted between this nurse and patient, however failed, due to patient having technical difficulties OR patient did not have access to video capability.  We continued and completed visit with audio only.  Some vital signs may be absent or patient reported.   Criselda Peaches, LPN  Cardiac Risk Factors include: advanced age (>21mn, >>60women);diabetes mellitus;male gender;hypertension;obesity (BMI >30kg/m2)     Objective:    Today's Vitals   01/20/23 1420  Weight: 215 lb (97.5 kg)  Height: 5' 8"$  (1.727 m)   Body mass index is 32.69 kg/m.     01/20/2023    2:28 PM 12/16/2022    2:15 PM 09/16/2022    3:38 PM 06/14/2022    7:56 AM 05/07/2022    7:40 AM 01/25/2022    2:46 PM 02/15/2021    9:07 AM  Advanced Directives  Does Patient Have a Medical Advance Directive? Yes Yes Yes Yes Yes Yes Yes  Type of [place]of [place]  [place] will   [place] will [place] will  Does patient want to make changes to medical advance directive?      No - Patient declined No - Patient declined  Copy of [place] Chart? No - copy  requested     No - copy requested No - copy requested    Current Medications (verified) Outpatient Encounter Medications as of 01/20/2023  Medication Sig   ACCU-CHEK FASTCLIX LANCETS MISC Test once daily Dx E11.9   Apoaequorin (PREVAGEN PO) Take 1 capsule by mouth daily.   Apple Cider Vinegar 600 MG CAPS Take 1 capsule by mouth daily.   aspirin EC 81 MG tablet Take 81 mg by mouth daily.   Cholecalciferol (VITAMIN D) 50 MCG (2000 UT) CAPS Take 1 capsule by mouth daily.   docusate sodium (COLACE) 250 MG capsule Take 250 mg by mouth in the morning and at bedtime.   donepezil (ARICEPT) 10 MG tablet TAKE 1 TABLET AT BEDTIME   Garlic 1123XX123MG CAPS Take 1 capsule by mouth daily.   glucose blood (ACCU-CHEK AVIVA) test strip Test once daily. E11.9   ipratropium (ATROVENT) 0.03 % nasal spray Place 2 sprays into both nostrils daily.   metFORMIN (GLUCOPHAGE) 500 MG tablet TAKE 2 TABLETS TWO TIMES DAILY WITH A MEAL   mirabegron ER (MYRBETRIQ) 25 MG TB24 tablet Take 1 tablet (25 mg total) by mouth daily.   pantoprazole (PROTONIX) 40 MG tablet Take 1 tablet (40 mg total) by mouth daily.   Psyllium 100 % PACK Take 1 packet by mouth daily.   REPATHA SURECLICK [id number]/ML SOAJ INJECT 140MG (1ML) EVERY 14 DAYS   valsartan-hydrochlorothiazide (DIOVAN-HCT) 160-12.5 MG tablet TAKE 1 TABLET AT BEDTIME   Zinc 25 MG TABS Take  1 tablet by mouth daily.   No facility-administered encounter medications on file as of 01/20/2023.    Allergies (verified) Lipitor [atorvastatin calcium], Penicillins, and Statins   History: Past Medical History:  Diagnosis Date   Acute serous otitis media 02/22/2010   Arthritis    Diabetes mellitus without complication ([place])    ERECTILE DYSFUNCTION 04/25/2009   GERD 03/24/2009   HYPERLIPIDEMIA 03/28/2010   HYPERTENSION 03/24/2009   OBESITY, MODERATE 04/25/2009   PONV (postoperative nausea and vomiting)    Past Surgical History:  Procedure Laterality Date   COLONOSCOPY N/A 06/21/2015    Procedure: COLONOSCOPY;  Surgeon: Rogene Houston, MD;  Location: [facility];  Service: Endoscopy;  Laterality: N/A;  930 - moved to 7/20 @ 7:30   NASAL SEPTUM SURGERY  1974   TONSILLECTOMY     3 yrs. old   TOTAL KNEE ARTHROPLASTY  06/11/2012   Procedure: TOTAL KNEE ARTHROPLASTY;  Surgeon: Johnn Hai, MD;  Location: [facility];  Service: Orthopedics;  Laterality: Right;   Family History  Problem Relation Age of Onset   Scleroderma Mother    Dementia Father    Social History   Socioeconomic History   Marital status: Married    Spouse name: Ethel   Number of children: 1   Years of education: 18   Highest education level: Professional school degree (e.g., MD, DDS, DVM, JD)  Occupational History   Occupation: retired  Tobacco Use   Smoking status: Never   Smokeless tobacco: Never  Vaping Use   Vaping Use: Never used  Substance and Sexual Activity   Alcohol use: No   Drug use: No   Sexual activity: Not on file  Other Topics Concern   Not on file  Social History Narrative   Married to [place] - 2   1 daughter lives next door   Grandchildren   Caffeine 1 a day   Right hand   Social Determinants of Health   Financial Resource Strain: Low Risk  (01/20/2023)   Overall Financial Resource Strain (CARDIA)    Difficulty of Paying Living Expenses: Not hard at all  Food Insecurity: No Food Insecurity (01/20/2023)   Hunger Vital Sign    Worried About Running Out of Food in the Last Year: Never true    [place] in the Last Year: Never true  Transportation Needs: No Transportation Needs (01/20/2023)   PRAPARE - Hydrologist (Medical): No    Lack of Transportation (Non-Medical): No  Physical Activity: Insufficiently Active (01/20/2023)   Exercise Vital Sign    Days of Exercise per Week: 3 days    Minutes of Exercise per Session: 30 min  Stress: No Stress Concern Present (01/20/2023)   [place]    Feeling of Stress : Not at all  Social Connections: [place] (01/20/2023)   Social Connection and Isolation Panel [NHANES]    Frequency of Communication with Friends and Family: More than three times a week    Frequency of Social Gatherings with Friends and Family: More than three times a week    Attends Religious Services: More than 4 times per year    Active Member of [employer] or Organizations: Yes    Attends Music therapist: More than 4 times per year    Marital Status: Married    Tobacco Counseling Counseling given: Not Answered   Clinical Intake:  Airline pilot  completed: Yes  Pain : No/denies pain   Nutrition Risk Assessment:  Has the patient had any N/V/D within the last 2 months?  No  Does the patient have any non-healing wounds?  No  Has the patient had any unintentional weight loss or weight gain?  No   Diabetes:  Is the patient diabetic?  Yes  If diabetic, was a CBG obtained today?  No  Did the patient bring in their glucometer from home?  No  How often do you monitor your CBG's? Weekly.   Financial Strains and Diabetes Management:  Are you having any financial strains with the device, your supplies or your medication? No .  Does the patient want to be seen by Chronic Care Management for management of their diabetes?  No  Would the patient like to be referred to a Nutritionist or for Diabetic Management?  No   Diabetic Exams:  Diabetic Eye Exam: Completed No. Overdue for diabetic eye exam. Pt has been advised about the importance in completing this exam. A referral has been placed today. Message sent to referral coordinator for scheduling purposes. Advised pt to expect a call from office referred to regarding appt.  Diabetic Foot Exam: Completed No. Pt has been advised about the importance in completing this exam. Pt is scheduled for diabetic foot exam on Followed by PCP.    BMI - recorded:  32.69 Nutritional Status: BMI > 30  Obese Nutritional Risks: None Diabetes: Yes CBG done?: No Did pt. bring in CBG monitor from home?: No  How often do you need to have someone help you when you read instructions, pamphlets, or other written materials from your doctor or pharmacy?: 1 - Never  Diabetic?  Yes  Interpreter Needed?: No  Information entered by :: Rolene Arbour LPN   Activities of Daily Living    01/20/2023    2:26 PM 01/25/2022    2:44 PM  In your present state of health, do you have any difficulty performing the following activities:  Hearing? 0 0  Vision? 0 0  Difficulty concentrating or making decisions? 0 0  Walking or climbing stairs? 0 0  Dressing or bathing? 0 0  Doing errands, shopping? 0 0  Preparing Food and eating ? N N  Using the Toilet? N N  In the past six months, have you accidently leaked urine? N N  Do you have problems with loss of bowel control? N N  Managing your Medications? N N  Managing your Finances? N N  Housekeeping or managing your Housekeeping? N N    Patient Care Team: Eulas Post, MD as PCP - General Kipp Brood, Mariam Dollar, [facility] (Inactive) as Pharmacist (Pharmacist)  Indicate any recent Medical Services you may have received from other than [facility] providers in the past year (date may be approximate).     Assessment:   This is a routine wellness examination for Clifford Gibson.  Hearing/Vision screen Hearing Screening - Comments:: Denies hearing difficulties   Vision Screening - Comments:: Wears rx glasses - up to date with routine eye exams with  Coralie Keens  Dietary issues and exercise activities discussed: Current Exercise Habits: Home exercise routine, Type of exercise: walking, Time (Minutes): 30, Frequency (Times/Week): 3, Weekly Exercise (Minutes/Week): 90, Intensity: Moderate, Exercise limited by: None identified   Goals Addressed               This Visit's Progress     Lose weight (pt-stated)        I would  liketo lose about 15 pounds.       Depression Screen    01/20/2023    2:25 PM 12/06/2022   12:08 PM 01/25/2022    2:41 PM 02/15/2021    9:09 AM 11/30/2019    1:48 PM 07/23/2019    8:01 AM 03/10/2018   10:43 AM  PHQ 2/9 Scores  PHQ - 2 Score 0 0 0 0 0 0 0  PHQ- 9 Score      0     Fall Risk    01/20/2023    2:26 PM 09/16/2022    3:37 PM 06/14/2022    7:55 AM 04/16/2022    9:57 AM 01/25/2022    2:45 PM  Fall Risk   Falls in the past year? 0 1 0 0 0  Number falls in past yr: 0 0 0  0  Injury with Fall? 0 1 0  0  Risk for fall due to : No Fall Risks    No Fall Risks  Follow up Falls prevention discussed        FALL RISK PREVENTION PERTAINING TO THE HOME:  Any stairs in or around the home? No  If so, are there any without handrails? No  Home free of loose throw rugs in walkways, pet beds, electrical cords, etc? Yes  Adequate lighting in your home to reduce risk of falls? Yes   ASSISTIVE DEVICES UTILIZED TO PREVENT FALLS:  Life alert? No  Use of a cane, walker or w/c? No  Grab bars in the bathroom? Yes  Shower chair or bench in shower? Yes  Elevated toilet seat or a handicapped toilet? Yes   TIMED UP AND GO:  Was the test performed? No .Audio Visit   Cognitive Function:      06/14/2022    3:00 PM  Montreal Cognitive Assessment   Visuospatial/ Executive (0/5) 3  Naming (0/3) 3  Attention: Read list of digits (0/2) 2  Attention: Read list of letters (0/1) 1  Attention: Serial 7 subtraction starting at 100 (0/3) 2  Language: Repeat phrase (0/2) 2  Language : Fluency (0/1) 0  Abstraction (0/2) 1  Delayed Recall (0/5) 2  Orientation (0/6) 6  Total 22  Adjusted Score (based on education) 22      01/20/2023    2:28 PM 01/25/2022    2:46 PM 11/30/2019    1:49 PM  6CIT Screen  What Year? 0 points 0 points 0 points  What month? 0 points 0 points 0 points  What time? 0 points 0 points 0 points  Count back from 20 0 points 0 points 0 points  Months in reverse 0 points  0 points 0 points  Repeat phrase 0 points 0 points 0 points  Total Score 0 points 0 points 0 points    Immunizations Immunization History  Administered Date(s) Administered   Fluad Quad(high Dose 65+) 09/18/2020, 09/20/2022   Influenza Whole 10/03/2011   Influenza, High Dose Seasonal PF 09/24/2016   Influenza,inj,Quad PF,6+ Mos 09/11/2018, 08/26/2019   Influenza-Unspecified 10/14/2015, 09/12/2017, 09/14/2018   MODERNA COVID-19 SARS-COV-2 PEDS BIVALENT BOOSTER 6Y-11Y 09/27/2020   Pneumococcal Conjugate-13 02/03/2014   Pneumococcal Polysaccharide-23 03/28/2010, 09/14/2018   Td 01/07/2008   Tdap 12/01/2018   Zoster Recombinat (Shingrix) 09/14/2018, 11/17/2018    TDAP status: Up to date  Flu Vaccine status: Up to date  Pneumococcal vaccine status: Up to date  Covid-19 vaccine status: Completed vaccines  Qualifies for Shingles Vaccine? Yes   Zostavax completed Yes  Shingrix Completed?: Yes  Screening Tests Health Maintenance  Topic Date Due   FOOT EXAM  01/21/2023 (Originally 02/19/2022)   COVID-19 Vaccine (2 - 2023-24 season) 02/05/2023 (Originally 08/02/2022)   Diabetic kidney evaluation - eGFR measurement  04/17/2023   Diabetic kidney evaluation - Urine ACR  04/23/2023   OPHTHALMOLOGY EXAM  05/03/2023   HEMOGLOBIN A1C  06/06/2023   Medicare Annual Wellness (AWV)  01/21/2024   DTaP/Tdap/Td (3 - Td or Tdap) 12/01/2028   Pneumonia Vaccine 75+ Years old  Completed   INFLUENZA VACCINE  Completed   Hepatitis C Screening  Completed   Zoster Vaccines- Shingrix  Completed   HPV VACCINES  Aged Out   COLONOSCOPY (Pts 45-67yr Insurance coverage will need to be confirmed)  Discontinued    Health Maintenance  There are no preventive care reminders to display for this patient.   Colorectal cancer screening: No longer required.   Lung Cancer Screening: (Low Dose CT Chest recommended if Age 78-80years, 30 pack-year currently smoking OR have quit w/in 15years.) does not  qualify.     Additional Screening:  Hepatitis C Screening: does qualify; Completed 10/28/17  Vision Screening: Recommended annual ophthalmology exams for early detection of glaucoma and other disorders of the eye. Is the patient up to date with their annual eye exam?  Yes  Who is the provider or what is the name of the office in which the patient attends annual eye exams? [facility] If pt is not established with a provider, would they like to be referred to a provider to establish care? No .   Dental Screening: Recommended annual dental exams for proper oral hygiene  Community Resource Referral / Chronic Care Management:  CRR required this visit?  No   CCM required this visit?  No      Plan:     I have personally reviewed and noted the following in the patient's chart:   Medical and social history Use of alcohol, tobacco or illicit drugs  Current medications and supplements including opioid prescriptions. Patient is not currently taking opioid prescriptions. Functional ability and status Nutritional status Physical activity Advanced directives List of other physicians Hospitalizations, surgeries, and ER visits in previous 12 months Vitals Screenings to include cognitive, depression, and falls Referrals and appointments  In addition, I have reviewed and discussed with patient certain preventive protocols, quality metrics, and best practice recommendations. A written personalized care plan for preventive services as well as general preventive health recommendations were provided to patient.     BCriselda Peaches LPN   [id number]  Nurse Notes: None

## 2023-01-22 ENCOUNTER — Ambulatory Visit (HOSPITAL_COMMUNITY): Payer: Medicare PPO

## 2023-01-22 DIAGNOSIS — R42 Dizziness and giddiness: Secondary | ICD-10-CM | POA: Diagnosis not present

## 2023-01-22 DIAGNOSIS — R2681 Unsteadiness on feet: Secondary | ICD-10-CM

## 2023-01-22 DIAGNOSIS — M6281 Muscle weakness (generalized): Secondary | ICD-10-CM | POA: Diagnosis not present

## 2023-01-22 NOTE — Therapy (Addendum)
OUTPATIENT PHYSICAL THERAPY VESTIBULAR EVALUATION     Patient Name: Clifford Gibson MRN: [id number] DOB:1946/02/09, 77 y.o., male Today's Date: 01/22/2023  END OF SESSION:  PT End of Session - 01/22/23 1429     Visit Number 3    Number of Visits 16    Date for PT Re-Evaluation 03/05/23    Authorization Type [employer] (approved 16 visitis and 1 RE)    Authorization Time Period 01/08/2023-03/05/2023    Authorization - Visit Number 3    Authorization - Number of Visits 16    Progress Note Due on Visit 8    PT Start Time 1430    PT Stop Time 1515    PT Time Calculation (min) 45 min            Past Medical History:  Diagnosis Date   Acute serous otitis media 02/22/2010   Arthritis    Diabetes mellitus without complication ([place])    ERECTILE DYSFUNCTION 04/25/2009   GERD 03/24/2009   HYPERLIPIDEMIA 03/28/2010   HYPERTENSION 03/24/2009   OBESITY, MODERATE 04/25/2009   PONV (postoperative nausea and vomiting)    Past Surgical History:  Procedure Laterality Date   COLONOSCOPY N/A 06/21/2015   Procedure: COLONOSCOPY;  Surgeon: Rogene Houston, MD;  Location: [facility];  Service: Endoscopy;  Laterality: N/A;  930 - moved to 7/20 @ 7:30   NASAL SEPTUM SURGERY  1974   TONSILLECTOMY     3 yrs. old   TOTAL KNEE ARTHROPLASTY  06/11/2012   Procedure: TOTAL KNEE ARTHROPLASTY;  Surgeon: Johnn Hai, MD;  Location: [facility];  Service: Orthopedics;  Laterality: Right;   Patient Active Problem List   Diagnosis Date Noted   Mild cognitive impairment 06/14/2022   Rhinorrhea 06/14/2022   Type 2 diabetes mellitus, controlled ([place]) 07/04/2015   Hyperglycemia 02/02/2015   Allergic rhinitis 05/14/2013   Hyperlipemia 03/28/2010   ACUTE SEROUS OTITIS MEDIA 02/22/2010   OBESITY, MODERATE 04/25/2009   ERECTILE DYSFUNCTION 04/25/2009   Essential hypertension 03/24/2009   GERD 03/24/2009    PCP: Eulas Post, MD  REFERRING PROVIDER: Leta Baptist  REFERRING DIAG:  dizziness  THERAPY DIAG:  Dizziness and giddiness  Unsteadiness on feet  Muscle weakness (generalized)  ONSET DATE: Years ago  Rationale for Evaluation and Treatment: Rehabilitation  SUBJECTIVE:   SUBJECTIVE STATEMENT: Denies dizziness at the moment. Patient states that he's not dizzy anymore when he lays on his L side or on his back. However, patient still gets dizzy when turning fast.  Denies dizziness at the moment. However, patient states that when he moves fast (e.g. turning to the L/R), he gets dizzy. Patient denies any spinning sensation or lightheadedness. However, laying on his L side makes the room "spin". Condition started years ago. Cannot associate any precipitating event but patient claims that he's prone to frequent sinus infections and were treated by antiobiotics in the past which addressed the infection.Patient recalls that when he was 4, he fell and hit his head at that time which made him dizzy but the dizziness went away. Last month, patient had a test about his dizziness and was told by his physician that the issue is not coming from the inner ear but on the brain itself. Patient was then referred to outpatient PT evaluation and management for dizziness. Patient was just recently D/C from another outpatient PT where he was seen for LE weakness. Pt accompanied by: self  PERTINENT HISTORY: R TKA, recurrent sinus infections, LE weakness  PAIN:  Are you having pain? None  PRECAUTIONS: None  WEIGHT BEARING RESTRICTIONS: No  FALLS: Has patient fallen in last 6 months? No  LIVING ENVIRONMENT: Lives with: lives with their spouse Lives in: House/apartment Stairs: No Has following equipment at home: Lobbyist, Environmental consultant - 2 wheeled, Electronics engineer, and Grab bars  PLOF: Independent  PATIENT GOALS: "I want something that would help me compensate and train this portion of the brain so I will be less dizzy"  OBJECTIVE:   DIAGNOSTIC FINDINGS:   MR Brain WO  Contrast 06/17/2022 IMPRESSION: 1. Mildly motion degraded exam. 2. No evidence of acute intracranial abnormality. 3. Mild-to-moderate chronic small vessel image changes cerebral white matter, stable from the recent prior MRI of 05/11/2022. 4. Mild-to-moderate generalized cerebral atrophy. Comparatively mild cerebellar atrophy. 5. Paranasal sinus disease, as described. 6. Small-volume fluid within the bilateral mastoid air cells.  COGNITION: Overall cognitive status: Within functional limits for tasks assessed   SENSATION: Not tested  POSTURE:  rounded shoulders and forward head  Cervical ROM:    Active A/PROM (deg) eval  Flexion WFL  Extension 25%  Right lateral flexion   Left lateral flexion   Right rotation 50%  Left rotation 5)%  (Blank rows = not tested)  LOWER EXTREMITY MMT:   MMT Right eval Left eval  Hip flexion 4+ 4+  Hip abduction 4+ 4+  Hip adduction    Hip internal rotation    Hip external rotation    Knee flexion 4+ 4+  Knee extension 4+ 4+  Ankle dorsiflexion 4+ 4+  Ankle plantarflexion 4+ 4+  Ankle inversion    Ankle eversion    (Blank rows = not tested)  BED MOBILITY:  Sit to supine Complete Independence  TRANSFERS: Assistive device utilized: None  Sit to stand: Complete Independence Stand to sit: Complete Independence  GAIT: Gait pattern: step through pattern, decreased arm swing- Right, decreased arm swing- Left, decreased stride length, decreased hip/knee flexion- Right, decreased hip/knee flexion- Left, and narrow BOS Distance walked: 20 Assistive device utilized: None Level of assistance: Complete Independence   FUNCTIONAL TESTS:  DGI 1. Gait level surface (2) Mild Impairment: Walks 20', uses assistive devices, slower speed, mild gait deviations. 2. Change in gait speed (2) Mild Impairment: Is able to change speed but demonstrates mild gait deviations, or not gait deviations but unable to achieve a significant change in  velocity, or uses an assistive device. 3. Gait with horizontal head turns (1) Moderate Impairment: Performs head turns with moderate change in gait velocity, slows down, staggers but recovers, can continue to walk. 4. Gait with vertical head turns 1) Moderate Impairment: Performs head turns with moderate change in gait velocity, slows down, staggers but recovers, can continue to walk. 5. Gait and pivot turn (2) Mild Impairment: Pivot turns safely in > 3 seconds and stops with no loss of balance. 6. Step over obstacle (1) Moderate Impairment: Is able to step over box but must stop, then step over. May require verbal cueing. 7. Step around obstacles (2) Mild Impairment: Is able to step around both cones, but must slow down and adjust steps to clear cones. 8. Stairs (2) Mild Impairment: Alternating feet, must use rail.  TOTAL SCORE: 13 / 24   VESTIBULAR ASSESSMENT:  SYMPTOM BEHAVIOR:  Type of dizziness: Blurred Vision, Imbalance (Disequilibrium), Unsteady with head/body turns, and "Funny feeling in the head"  Aggravating factors: Induced by motion: turning body quickly, turning head quickly, and sitting in a moving car and Worse  outside or in busy environment  Relieving factors: rest  Progression of symptoms: unchanged  OCULOMOTOR EXAM:  Ocular Alignment: normal  Ocular ROM: No Limitations  Spontaneous Nystagmus: absent  Gaze-Induced Nystagmus: absent  Smooth Pursuits: intact  Saccades: hypometric/undershoots  Intact CN 3,4,6 on B  FRENZEL - FIXATION SUPRESSED: Positional tests: Right Dix-Hallpike: no nystagmus Left Dix-Hallpike: no nystagmus observed but reported of "spinning sensation" (vertigo) Right Roll Test: no nystagmus Left Roll Test: no nystagmus but reported vertigo    VESTIBULAR - OCULAR REFLEX:   VOR Cancellation: Corrective Saccades  Head-Impulse Test: HIT Right: positive HIT Left: positive VOR 1 (horizontal): impaired, dizziness reported    MOTION  SENSITIVITY:  Motion Sensitivity Quotient Intensity: 0 = none, 1 = Lightheaded, 2 = Mild, 3 = Moderate, 4 = Severe, 5 = Vomiting  Intensity  1. Sitting to supine   2. Supine to L side   3. Supine to R side   4. Supine to sitting   5. L Hallpike-Dix   6. Up from L    7. R Hallpike-Dix   8. Up from R    9. Sitting, head tipped to L knee   10. Head up from L knee   11. Sitting, head tipped to R knee   12. Head up from R knee   13. Sitting head turns x5   14.Sitting head nods x5   15. In stance, 180 turn to L  3  16. In stance, 180 turn to R 3    (Blank rows = not tested) (+) Rhomberg test (-) Active VBI test on B  VESTIBULAR TREATMENT:                                                                                                   DATE: 01/22/23 Seated:  Cervical flex/ext, rot AROM x 10 x 2 on each as warm-up  Horizontal saccades by identifying numbers x 1'  Vertical saccades by reading words x 1'  Horizontal smooth pursuits by counting shapes/colors x 1' Vertical smooth pursuits by identifying animal silhouettes x 1' Horizontal VOR 1 by identifying shapes, 70 bpm metronome x 1' Horizontal VOR 1 by reading words, 70 bpm metronome, x 1' VOR cancellation by reading words x 1' VOR cancellation by identifying shapes x 1'  01/13/23 LT BBQ roll for horizontal canal per previous assessment (reports mild dizziness initially but no nystagmus)  -poor transition from supine to LT sidelying   Seated head turns x 10 (no sx) Seated head nods x10 (no sx)  Seated VOR 1 horizontal x10 (mild dizziness, difficulty tracking to LT)   01/08/23 Evaluation and patient education done  PATIENT EDUCATION: Education details: Updated HEP Person educated: Patient Education method: Consulting civil engineer, Demonstration, and Handouts Education comprehension: verbalized understanding, returned demonstration, and needs further education  HOME EXERCISE PROGRAM: 01/22/23 - done 2-3x/day, 5-7 days/week (written  copy provided and reviewed)  Exercise 1 (Horizontal Saccades) Use two cards for this exercise While holding the cards on each hand side by side in sitting with arms outstretched, look at the first item on the other card then  look at the next item on the second card then alternate your gaze sequentially. Do NOT move your cards nor the your head. Do the following for one minute each: Identifying numbers Reading words Identifying shapes  Exercise 2 (Paradigm x1) Use only one card for this exercise While holding the card in sitting with your arm outstretched, turn your head left and right at your own pace while keeping the card stationary.  Do this for one minute each while you are: Identifying numbers Reading words Identifying shapes  Exercise 4 (VOR cancellation) Use only one card for this exercise While holding the card in sitting with your arms outstretched, turn your body, arm, and head at the same time left and right at your own pace Do this for one minute each while you are: Identifying numbers Reading words Identifying shapes   Access Code: [email or web address] URL: [email or web address] Date: 01/13/2023 Prepared by: Josue Hector  Exercises - Seated Gaze Stabilization with Head Rotation  - 3 x daily - 7 x weekly - 3 sets - 10 reps - Seated Left Head Turns Vestibular Habituation  - 3 x daily - 7 x weekly - 3 sets - 10 reps  GOALS: Goals reviewed with patient? Yes  SHORT TERM GOALS: Target date: 02/05/23  Pt will demonstrate indep in HEP to facilitate carry-over of skilled services and improve functional outcomes  Goal status: IN PROGRESS  2.  Pt will demonstrate a negative L Dix-Hallpike to facilitate ease in bed mobility Baseline: positive Goal status: IN PROGRESS   LONG TERM GOALS: Target date: 03/05/23  Pt will improve Motion sensitivity quotient to 0 (turning in standing) in order to demonstrate clinically significant improvement in balance and decreased risk  for falls Baseline: 3 Goal status: INITIAL  2.  Pt will improve DGI by at least 3 points in order to demonstrate clinically significant improvement in balance and decreased risk for falls  Baseline: 13 Goal status: INITIAL  ASSESSMENT:  CLINICAL IMPRESSION: Patient was instructed to lay on his L side and on his back at the beginning of the session to assess the presence of vertigo. Patient reported that the room doesn't spin when he lay on his L side but reports that he's a little "unsteady". No vertigo was reported when patient was laying on his back. No nystagmus was seen in both positions. Interventions today were geared towards gaze stabilization. Tolerated all activities with slight increase in dizziness when doing the VOR 1 and the VOR cancellation activity. Corrective saccades still observed. Demonstrated appropriate slight levels of fatigue. Rest periods were provided. Required moderate amount of tactile and visual cueing to ensure correct execution of activity particularly in the VOR cancellation and VOR 1 activity. Patient tends to stop to focus his attention to the task and has difficulty keeping up with the metronome pace. To date, skilled PT is required to address the impairments and improve function.  OBJECTIVE IMPAIRMENTS: Abnormal gait, decreased balance, difficulty walking, dizziness, and impaired flexibility.   ACTIVITY LIMITATIONS: standing and bed mobility  PARTICIPATION LIMITATIONS: community activity  PERSONAL FACTORS: Age and Fitness are also affecting patient's functional outcome.   REHAB POTENTIAL: Fair    CLINICAL DECISION MAKING: Evolving/moderate complexity  EVALUATION COMPLEXITY: Moderate   PLAN:  PT FREQUENCY: 2x/week  PT DURATION: 8 weeks  PLANNED INTERVENTIONS: Therapeutic exercises, Therapeutic activity, Neuromuscular re-education, Balance training, Gait training, Patient/Family education, Self Care, Vestibular training, and Canalith  repositioning  PLAN FOR NEXT SESSION: Continue POC and may progress as tolerated with emphasis  on adaptation, habituation, and substitution activities. Recheck if patient still has vertigo on supine and L side lying positions.   Harvie Heck. Tammey Deeg, PT, DPT, OCS Board-Certified Clinical Specialist in [place] # ([place]): [id number] T 3:30 PM, 01/22/23

## 2023-01-27 ENCOUNTER — Encounter: Payer: Medicare PPO | Admitting: Psychology

## 2023-01-27 ENCOUNTER — Ambulatory Visit: Payer: Medicare PPO | Admitting: Physician Assistant

## 2023-01-28 ENCOUNTER — Encounter (HOSPITAL_COMMUNITY): Payer: Medicare PPO

## 2023-01-29 ENCOUNTER — Encounter (HOSPITAL_COMMUNITY): Payer: Medicare PPO

## 2023-01-30 ENCOUNTER — Encounter (HOSPITAL_COMMUNITY): Payer: Medicare PPO

## 2023-02-04 ENCOUNTER — Encounter (HOSPITAL_COMMUNITY): Payer: Medicare PPO

## 2023-02-06 ENCOUNTER — Ambulatory Visit (HOSPITAL_COMMUNITY): Payer: Medicare PPO | Attending: Otolaryngology

## 2023-02-06 DIAGNOSIS — M6281 Muscle weakness (generalized): Secondary | ICD-10-CM | POA: Insufficient documentation

## 2023-02-06 DIAGNOSIS — R2681 Unsteadiness on feet: Secondary | ICD-10-CM | POA: Insufficient documentation

## 2023-02-06 DIAGNOSIS — R42 Dizziness and giddiness: Secondary | ICD-10-CM | POA: Insufficient documentation

## 2023-02-06 NOTE — Therapy (Signed)
OUTPATIENT PHYSICAL THERAPY VESTIBULAR TREATMENT     Patient Name: Clifford Gibson MRN: [id number] DOB:1946-03-12, 77 y.o., male Today's Date: 02/06/2023  END OF SESSION:  PT End of Session - 02/06/23 1522     Visit Number 4    Number of Visits 16    Date for PT Re-Evaluation 03/05/23    Authorization Type [employer] (approved 16 visitis and 1 RE)    Authorization Time Period 01/08/2023-03/05/2023    Authorization - Visit Number 4    Authorization - Number of Visits 16    Progress Note Due on Visit 8    PT Start Time 1430    PT Stop Time 1510    PT Time Calculation (min) 40 min    Activity Tolerance Patient tolerated treatment well    Behavior During Therapy [facility] for tasks assessed/performed            Past Medical History:  Diagnosis Date   Acute serous otitis media 02/22/2010   Arthritis    Diabetes mellitus without complication ([place])    ERECTILE DYSFUNCTION 04/25/2009   GERD 03/24/2009   HYPERLIPIDEMIA 03/28/2010   HYPERTENSION 03/24/2009   OBESITY, MODERATE 04/25/2009   PONV (postoperative nausea and vomiting)    Past Surgical History:  Procedure Laterality Date   COLONOSCOPY N/A 06/21/2015   Procedure: COLONOSCOPY;  Surgeon: Rogene Houston, MD;  Location: [facility];  Service: Endoscopy;  Laterality: N/A;  930 - moved to 7/20 @ 7:30   NASAL SEPTUM SURGERY  1974   TONSILLECTOMY     3 yrs. old   TOTAL KNEE ARTHROPLASTY  06/11/2012   Procedure: TOTAL KNEE ARTHROPLASTY;  Surgeon: Johnn Hai, MD;  Location: [facility];  Service: Orthopedics;  Laterality: Right;   Patient Active Problem List   Diagnosis Date Noted   Mild cognitive impairment 06/14/2022   Rhinorrhea 06/14/2022   Type 2 diabetes mellitus, controlled ([place]) 07/04/2015   Hyperglycemia 02/02/2015   Allergic rhinitis 05/14/2013   Hyperlipemia 03/28/2010   ACUTE SEROUS OTITIS MEDIA 02/22/2010   OBESITY, MODERATE 04/25/2009   ERECTILE DYSFUNCTION 04/25/2009   Essential hypertension 03/24/2009    GERD 03/24/2009    PCP: Eulas Post, MD  REFERRING PROVIDER: Leta Baptist  REFERRING DIAG: dizziness  THERAPY DIAG:  Dizziness and giddiness  Unsteadiness on feet  ONSET DATE: Years ago  Rationale for Evaluation and Treatment: Rehabilitation  SUBJECTIVE:   SUBJECTIVE STATEMENT: Denies dizziness at the moment. Patient has been doing his HEP and thinks that it has been helping him greatly as he's not as dizzy as much as he used to. However, patient states that there are still some instances that he gets vertigo when he lays on his L side. Patient states that he had treatment for the vertigo (positional techniques) in the past which helped.  Denies dizziness at the moment. However, patient states that when he moves fast (e.g. turning to the L/R), he gets dizzy. Patient denies any spinning sensation or lightheadedness. However, laying on his L side makes the room "spin". Condition started years ago. Cannot associate any precipitating event but patient claims that he's prone to frequent sinus infections and were treated by antiobiotics in the past which addressed the infection.Patient recalls that when he was 4, he fell and hit his head at that time which made him dizzy but the dizziness went away. Last month, patient had a test about his dizziness and was told by his physician that the issue is not coming from the  inner ear but on the brain itself. Patient was then referred to outpatient PT evaluation and management for dizziness. Patient was just recently D/C from another outpatient PT where he was seen for LE weakness. Pt accompanied by: self  PERTINENT HISTORY: R TKA, recurrent sinus infections, LE weakness  PAIN:  Are you having pain? None  PRECAUTIONS: None  WEIGHT BEARING RESTRICTIONS: No  FALLS: Has patient fallen in last 6 months? No  LIVING ENVIRONMENT: Lives with: lives with their spouse Lives in: House/apartment Stairs: No Has following equipment at home: Glass blower/designer, Environmental consultant - 2 wheeled, Electronics engineer, and Grab bars  PLOF: Independent  PATIENT GOALS: "I want something that would help me compensate and train this portion of the brain so I will be less dizzy"  OBJECTIVE:   DIAGNOSTIC FINDINGS:   MR Brain WO Contrast 06/17/2022 IMPRESSION: 1. Mildly motion degraded exam. 2. No evidence of acute intracranial abnormality. 3. Mild-to-moderate chronic small vessel image changes cerebral white matter, stable from the recent prior MRI of 05/11/2022. 4. Mild-to-moderate generalized cerebral atrophy. Comparatively mild cerebellar atrophy. 5. Paranasal sinus disease, as described. 6. Small-volume fluid within the bilateral mastoid air cells.  COGNITION: Overall cognitive status: Within functional limits for tasks assessed   SENSATION: Not tested  POSTURE:  rounded shoulders and forward head  Cervical ROM:    Active A/PROM (deg) eval  Flexion WFL  Extension 25%  Right lateral flexion   Left lateral flexion   Right rotation 50%  Left rotation 5)%  (Blank rows = not tested)  LOWER EXTREMITY MMT:   MMT Right eval Left eval  Hip flexion 4+ 4+  Hip abduction 4+ 4+  Hip adduction    Hip internal rotation    Hip external rotation    Knee flexion 4+ 4+  Knee extension 4+ 4+  Ankle dorsiflexion 4+ 4+  Ankle plantarflexion 4+ 4+  Ankle inversion    Ankle eversion    (Blank rows = not tested)  BED MOBILITY:  Sit to supine Complete Independence  TRANSFERS: Assistive device utilized: None  Sit to stand: Complete Independence Stand to sit: Complete Independence  GAIT: Gait pattern: step through pattern, decreased arm swing- Right, decreased arm swing- Left, decreased stride length, decreased hip/knee flexion- Right, decreased hip/knee flexion- Left, and narrow BOS Distance walked: 20 Assistive device utilized: None Level of assistance: Complete Independence   FUNCTIONAL TESTS:  DGI 1. Gait level surface (2) Mild  Impairment: Walks 20', uses assistive devices, slower speed, mild gait deviations. 2. Change in gait speed (2) Mild Impairment: Is able to change speed but demonstrates mild gait deviations, or not gait deviations but unable to achieve a significant change in velocity, or uses an assistive device. 3. Gait with horizontal head turns (1) Moderate Impairment: Performs head turns with moderate change in gait velocity, slows down, staggers but recovers, can continue to walk. 4. Gait with vertical head turns 1) Moderate Impairment: Performs head turns with moderate change in gait velocity, slows down, staggers but recovers, can continue to walk. 5. Gait and pivot turn (2) Mild Impairment: Pivot turns safely in > 3 seconds and stops with no loss of balance. 6. Step over obstacle (1) Moderate Impairment: Is able to step over box but must stop, then step over. May require verbal cueing. 7. Step around obstacles (2) Mild Impairment: Is able to step around both cones, but must slow down and adjust steps to clear cones. 8. Stairs (2) Mild Impairment: Alternating feet, must use  rail.  TOTAL SCORE: 13 / 24   VESTIBULAR ASSESSMENT:  SYMPTOM BEHAVIOR:  Type of dizziness: Blurred Vision, Imbalance (Disequilibrium), Unsteady with head/body turns, and "Funny feeling in the head"  Aggravating factors: Induced by motion: turning body quickly, turning head quickly, and sitting in a moving car and Worse outside or in busy environment  Relieving factors: rest  Progression of symptoms: unchanged  OCULOMOTOR EXAM:  Ocular Alignment: normal  Ocular ROM: No Limitations  Spontaneous Nystagmus: absent  Gaze-Induced Nystagmus: absent  Smooth Pursuits: intact  Saccades: hypometric/undershoots  Intact CN 3,4,6 on B  FRENZEL - FIXATION SUPRESSED: Positional tests: Right Dix-Hallpike: no nystagmus Left Dix-Hallpike: no nystagmus observed but reported of "spinning sensation" (vertigo) Right Roll Test: no  nystagmus Left Roll Test: no nystagmus but reported vertigo    VESTIBULAR - OCULAR REFLEX:   VOR Cancellation: Corrective Saccades  Head-Impulse Test: HIT Right: positive HIT Left: positive VOR 1 (horizontal): impaired, dizziness reported    MOTION SENSITIVITY:  Motion Sensitivity Quotient Intensity: 0 = none, 1 = Lightheaded, 2 = Mild, 3 = Moderate, 4 = Severe, 5 = Vomiting  Intensity  1. Sitting to supine   2. Supine to L side   3. Supine to R side   4. Supine to sitting   5. L Hallpike-Dix   6. Up from L    7. R Hallpike-Dix   8. Up from R    9. Sitting, head tipped to L knee   10. Head up from L knee   11. Sitting, head tipped to R knee   12. Head up from R knee   13. Sitting head turns x5   14.Sitting head nods x5   15. In stance, 180 turn to L  3  16. In stance, 180 turn to R 3    (Blank rows = not tested) (+) Rhomberg test (-) Active VBI test on B  VESTIBULAR TREATMENT:                                                                                                   DATE: 02/06/23 Casani maneuver for the L horizontal SCC x 1 rep Seated:  Cervical flex/ext, rot AROM x 10 x 2 on each as warm-up  Horizontal saccades by identifying numbers x 1'  Vertical saccades by reading words x 1'  Horizontal smooth pursuits by counting shapes/colors x 1' Vertical smooth pursuits by identifying animal silhouettes x 1' Horizontal VOR 1 by identifying colors, 80 bpm metronome x 1' Horizontal VOR 1 by reading words, 80 bpm metronome, x 1' VOR 2, identifying numbers x 1' VOR cancellation by identifying numbers x 1' VOR cancellation by identifying colors x 1'  01/22/23 Seated:           Cervical flex/ext, rot AROM x 10 x 2 on each as warm-up           Horizontal saccades by identifying numbers x 1'           Vertical saccades by reading words x 1'           Horizontal smooth  pursuits by counting shapes/colors x 1' Vertical smooth pursuits by identifying animal silhouettes x  1' Horizontal VOR 1 by identifying shapes, 70 bpm metronome x 1' Horizontal VOR 1 by reading words, 70 bpm metronome, x 1' VOR cancellation by reading words x 1' VOR cancellation by identifying shapes x 1'  01/13/23 LT BBQ roll for horizontal canal per previous assessment (reports mild dizziness initially but no nystagmus)  -poor transition from supine to LT sidelying   Seated head turns x 10 (no sx) Seated head nods x10 (no sx)  Seated VOR 1 horizontal x10 (mild dizziness, difficulty tracking to LT)   01/08/23 Evaluation and patient education done  PATIENT EDUCATION: Education details: Updated HEP Person educated: Patient Education method: Consulting civil engineer, Demonstration, and Handouts Education comprehension: verbalized understanding, returned demonstration, and needs further education  HOME EXERCISE PROGRAM: 01/22/23 - done 2-3x/day, 5-7 days/week (written copy provided and reviewed)  Exercise 1 (Horizontal Saccades) Use two cards for this exercise While holding the cards on each hand side by side in sitting with arms outstretched, look at the first item on the other card then look at the next item on the second card then alternate your gaze sequentially. Do NOT move your cards nor the your head. Do the following for one minute each: Identifying numbers Reading words Identifying shapes  Exercise 2 (Paradigm x1) Use only one card for this exercise While holding the card in sitting with your arm outstretched, turn your head left and right at your own pace while keeping the card stationary.  Do this for one minute each while you are: Identifying numbers Reading words Identifying shapes  Exercise 4 (VOR cancellation) Use only one card for this exercise While holding the card in sitting with your arms outstretched, turn your body, arm, and head at the same time left and right at your own pace Do this for one minute each while you are: Identifying numbers Reading words Identifying  shapes   Access Code: [email or web address] URL: [email or web address] Date: 01/13/2023 Prepared by: Josue Hector  Exercises - Seated Gaze Stabilization with Head Rotation  - 3 x daily - 7 x weekly - 3 sets - 10 reps - Seated Left Head Turns Vestibular Habituation  - 3 x daily - 7 x weekly - 3 sets - 10 reps  GOALS: Goals reviewed with patient? Yes  SHORT TERM GOALS: Target date: 02/05/23  Pt will demonstrate indep in HEP to facilitate carry-over of skilled services and improve functional outcomes  Goal status: IN PROGRESS  2.  Pt will demonstrate a negative L Dix-Hallpike to facilitate ease in bed mobility Baseline: positive Goal status: IN PROGRESS   LONG TERM GOALS: Target date: 03/05/23  Pt will improve Motion sensitivity quotient to 0 (turning in standing) in order to demonstrate clinically significant improvement in balance and decreased risk for falls Baseline: 3 Goal status: INITIAL  2.  Pt will improve DGI by at least 3 points in order to demonstrate clinically significant improvement in balance and decreased risk for falls  Baseline: 13 Goal status: INITIAL  ASSESSMENT:  CLINICAL IMPRESSION: Performed supine roll test to assess the presence of vertigo at the beginning of the session. Patient is positive for the test as he presented with L ageotropic nystagmus and reported of vertigo. With this, Casani maneuver was performed. Re-assessed patient to see if the nystagmus and vertigo were resolved after the procedure. Patient then presented normal findings (no nystagmus seen and no vertigo reported). Since patient's condition is recurrent, patient was advised  not to sleep flat or on his L side for 1-2 nights to allow for the crystals to settle. Patient gave excellent verbal understanding. Remaining interventions today were geared towards gaze stabilization. Tolerated all activities with little increase in dizziness when doing the VOR 1, VOR 2 and the VOR cancellation  activity. Demonstrated appropriate slight levels of fatigue. Rest periods were provided. Required moderate amount of tactile and visual cueing to ensure correct execution of activity particularly in the VOR cancellation, VOR 1, VOR 2, and vertical saccades activities, with fair carry-over. Patient still tends to stop to focus his attention to the task and still has difficulty keeping up with the metronome pace. To date, skilled PT is required to address the impairments and improve function.  OBJECTIVE IMPAIRMENTS: Abnormal gait, decreased balance, difficulty walking, dizziness, and impaired flexibility.   ACTIVITY LIMITATIONS: standing and bed mobility  PARTICIPATION LIMITATIONS: community activity  PERSONAL FACTORS: Age and Fitness are also affecting patient's functional outcome.   REHAB POTENTIAL: Fair    CLINICAL DECISION MAKING: Evolving/moderate complexity  EVALUATION COMPLEXITY: Moderate   PLAN:  PT FREQUENCY: 2x/week  PT DURATION: 8 weeks  PLANNED INTERVENTIONS: Therapeutic exercises, Therapeutic activity, Neuromuscular re-education, Balance training, Gait training, Patient/Family education, Self Care, Vestibular training, and Canalith repositioning  PLAN FOR NEXT SESSION: Continue POC and may progress as tolerated with emphasis on adaptation, habituation, and substitution activities. Recheck if patient still has vertigo on supine and L side lying positions.   Harvie Heck. Fabianna Keats, PT, DPT, OCS Board-Certified Clinical Specialist in [place] # ([place]): [id number] T 3:28 PM, 02/06/23

## 2023-02-11 ENCOUNTER — Ambulatory Visit (HOSPITAL_COMMUNITY): Payer: Medicare PPO

## 2023-02-11 DIAGNOSIS — R2681 Unsteadiness on feet: Secondary | ICD-10-CM | POA: Diagnosis not present

## 2023-02-11 DIAGNOSIS — M6281 Muscle weakness (generalized): Secondary | ICD-10-CM | POA: Diagnosis not present

## 2023-02-11 DIAGNOSIS — R42 Dizziness and giddiness: Secondary | ICD-10-CM

## 2023-02-11 NOTE — Therapy (Addendum)
OUTPATIENT PHYSICAL THERAPY VESTIBULAR TREATMENT     Patient Name: Clifford Gibson MRN: [id number] DOB:[birth year], 77 y.o., male Today's Date: 02/11/2023  END OF SESSION:  PT End of Session - 02/11/23 1444     Visit Number 5    Number of Visits 16    Date for PT Re-Evaluation 03/05/23    Authorization Type [employer] (approved 16 visitis and 1 RE)    Authorization Time Period 01/08/2023-03/05/2023    Authorization - Visit Number 5    Authorization - Number of Visits 16    Progress Note Due on Visit 8    PT Start Time 1435    PT Stop Time 1515    PT Time Calculation (min) 40 min    Equipment Utilized During Treatment Gait belt    Activity Tolerance Patient tolerated treatment well    Behavior During Therapy WFL for tasks assessed/performed            Past Medical History:  Diagnosis Date   Acute serous otitis media 02/22/2010   Arthritis    Diabetes mellitus without complication ([place])    ERECTILE DYSFUNCTION 04/25/2009   GERD 03/24/2009   HYPERLIPIDEMIA 03/28/2010   HYPERTENSION 03/24/2009   OBESITY, MODERATE 04/25/2009   PONV (postoperative nausea and vomiting)    Past Surgical History:  Procedure Laterality Date   COLONOSCOPY N/A 06/21/2015   Procedure: COLONOSCOPY;  Surgeon: Rogene Houston, MD;  Location: [facility];  Service: Endoscopy;  Laterality: N/A;  930 - moved to 7/20 @ 7:30   NASAL SEPTUM SURGERY  1974   TONSILLECTOMY     3 yrs. old   TOTAL KNEE ARTHROPLASTY  06/11/2012   Procedure: TOTAL KNEE ARTHROPLASTY;  Surgeon: Johnn Hai, MD;  Location: [facility];  Service: Orthopedics;  Laterality: Right;   Patient Active Problem List   Diagnosis Date Noted   Mild cognitive impairment 06/14/2022   Rhinorrhea 06/14/2022   Type 2 diabetes mellitus, controlled ([place]) 07/04/2015   Hyperglycemia 02/02/2015   Allergic rhinitis 05/14/2013   Hyperlipemia 03/28/2010   ACUTE SEROUS OTITIS MEDIA 02/22/2010   OBESITY, MODERATE 04/25/2009   ERECTILE  DYSFUNCTION 04/25/2009   Essential hypertension 03/24/2009   GERD 03/24/2009    PCP: Eulas Post, MD  REFERRING PROVIDER: Leta Baptist  REFERRING DIAG: dizziness  THERAPY DIAG:  Dizziness and giddiness  Unsteadiness on feet  Muscle weakness (generalized)  ONSET DATE: Years ago  Rationale for Evaluation and Treatment: Rehabilitation  SUBJECTIVE:   SUBJECTIVE STATEMENT: Currently denies dizziness. Patient reports that he's dizzy when he lays on his L side. However, looking at objects outside a fast car still distracts him. Also reports of some unsteadiness when standing and walking.  Denies dizziness at the moment. However, patient states that when he moves fast (e.g. turning to the L/R), he gets dizzy. Patient denies any spinning sensation or lightheadedness. However, laying on his L side makes the room "spin". Condition started years ago. Cannot associate any precipitating event but patient claims that he's prone to frequent sinus infections and were treated by antiobiotics in the past which addressed the infection.Patient recalls that when he was 4, he fell and hit his head at that time which made him dizzy but the dizziness went away. Last month, patient had a test about his dizziness and was told by his physician that the issue is not coming from the inner ear but on the brain itself. Patient was then referred to outpatient PT evaluation and management for dizziness.  Patient was just recently D/C from another outpatient PT where he was seen for LE weakness. Pt accompanied by: self  PERTINENT HISTORY: R TKA, recurrent sinus infections, LE weakness  PAIN:  Are you having pain? None  PRECAUTIONS: None  WEIGHT BEARING RESTRICTIONS: No  FALLS: Has patient fallen in last 6 months? No  LIVING ENVIRONMENT: Lives with: lives with their spouse Lives in: House/apartment Stairs: No Has following equipment at home: Lobbyist, Environmental consultant - 2 wheeled, Electronics engineer, and Grab  bars  PLOF: Independent  PATIENT GOALS: "I want something that would help me compensate and train this portion of the brain so I will be less dizzy"  OBJECTIVE:   DIAGNOSTIC FINDINGS:   MR Brain WO Contrast 06/17/2022 IMPRESSION: 1. Mildly motion degraded exam. 2. No evidence of acute intracranial abnormality. 3. Mild-to-moderate chronic small vessel image changes cerebral white matter, stable from the recent prior MRI of 05/11/2022. 4. Mild-to-moderate generalized cerebral atrophy. Comparatively mild cerebellar atrophy. 5. Paranasal sinus disease, as described. 6. Small-volume fluid within the bilateral mastoid air cells.  COGNITION: Overall cognitive status: Within functional limits for tasks assessed   SENSATION: Not tested  POSTURE:  rounded shoulders and forward head  Cervical ROM:    Active A/PROM (deg) eval  Flexion WFL  Extension 25%  Right lateral flexion   Left lateral flexion   Right rotation 50%  Left rotation 5)%  (Blank rows = not tested)  LOWER EXTREMITY MMT:   MMT Right eval Left eval  Hip flexion 4+ 4+  Hip abduction 4+ 4+  Hip adduction    Hip internal rotation    Hip external rotation    Knee flexion 4+ 4+  Knee extension 4+ 4+  Ankle dorsiflexion 4+ 4+  Ankle plantarflexion 4+ 4+  Ankle inversion    Ankle eversion    (Blank rows = not tested)  BED MOBILITY:  Sit to supine Complete Independence  TRANSFERS: Assistive device utilized: None  Sit to stand: Complete Independence Stand to sit: Complete Independence  GAIT: Gait pattern: step through pattern, decreased arm swing- Right, decreased arm swing- Left, decreased stride length, decreased hip/knee flexion- Right, decreased hip/knee flexion- Left, and narrow BOS Distance walked: 20 Assistive device utilized: None Level of assistance: Complete Independence   FUNCTIONAL TESTS:  DGI 1. Gait level surface (2) Mild Impairment: Walks 20', uses assistive devices, slower speed,  mild gait deviations. 2. Change in gait speed (2) Mild Impairment: Is able to change speed but demonstrates mild gait deviations, or not gait deviations but unable to achieve a significant change in velocity, or uses an assistive device. 3. Gait with horizontal head turns (1) Moderate Impairment: Performs head turns with moderate change in gait velocity, slows down, staggers but recovers, can continue to walk. 4. Gait with vertical head turns 1) Moderate Impairment: Performs head turns with moderate change in gait velocity, slows down, staggers but recovers, can continue to walk. 5. Gait and pivot turn (2) Mild Impairment: Pivot turns safely in > 3 seconds and stops with no loss of balance. 6. Step over obstacle (1) Moderate Impairment: Is able to step over box but must stop, then step over. May require verbal cueing. 7. Step around obstacles (2) Mild Impairment: Is able to step around both cones, but must slow down and adjust steps to clear cones. 8. Stairs (2) Mild Impairment: Alternating feet, must use rail.  TOTAL SCORE: 13 / 24   VESTIBULAR ASSESSMENT:  SYMPTOM BEHAVIOR:  Type of dizziness: Blurred  Vision, Imbalance (Disequilibrium), Unsteady with head/body turns, and "Funny feeling in the head"  Aggravating factors: Induced by motion: turning body quickly, turning head quickly, and sitting in a moving car and Worse outside or in busy environment  Relieving factors: rest  Progression of symptoms: unchanged  OCULOMOTOR EXAM:  Ocular Alignment: normal  Ocular ROM: No Limitations  Spontaneous Nystagmus: absent  Gaze-Induced Nystagmus: absent  Smooth Pursuits: intact  Saccades: hypometric/undershoots  Intact CN 3,4,6 on B  FRENZEL - FIXATION SUPRESSED: Positional tests: Right Dix-Hallpike: no nystagmus Left Dix-Hallpike: no nystagmus observed but reported of "spinning sensation" (vertigo) Right Roll Test: no nystagmus Left Roll Test: no nystagmus but reported  vertigo    VESTIBULAR - OCULAR REFLEX:   VOR Cancellation: Corrective Saccades  Head-Impulse Test: HIT Right: positive HIT Left: positive VOR 1 (horizontal): impaired, dizziness reported    MOTION SENSITIVITY:  Motion Sensitivity Quotient Intensity: 0 = none, 1 = Lightheaded, 2 = Mild, 3 = Moderate, 4 = Severe, 5 = Vomiting  Intensity  1. Sitting to supine   2. Supine to L side   3. Supine to R side   4. Supine to sitting   5. L Hallpike-Dix   6. Up from L    7. R Hallpike-Dix   8. Up from R    9. Sitting, head tipped to L knee   10. Head up from L knee   11. Sitting, head tipped to R knee   12. Head up from R knee   13. Sitting head turns x5   14.Sitting head nods x5   15. In stance, 180 turn to L  3  16. In stance, 180 turn to R 3    (Blank rows = not tested) (+) Rhomberg test (-) Active VBI test on B  VESTIBULAR TREATMENT:                                                                                                   DATE: 02/11/23 Standing on firm surface, normal BOS:  Cervical flex/ext, rot AROM x 10 x 2 on each as warm-up  Horizontal saccades by identifying numbers x 1'  Vertical saccades by reading words x 1'  Horizontal smooth pursuits by counting shapes/colors x 1' Vertical smooth pursuits by identifying animal silhouettes x 1' Horizontal VOR 1 by identifying colors, own pace x 1' Horizontal VOR 1 by reading words, own pace, x 1' VOR cancellation by identifying numbers x 1' VOR cancellation by identifying colors x 1' Rhythmic stabilization x 4" x 10 x 2, ant/post, eyes open Tandem stance x 30" x 2 on each  02/06/23 Casani maneuver for the L horizontal SCC x 1 rep Seated:  Cervical flex/ext, rot AROM x 10 x 2 on each as warm-up  Horizontal saccades by identifying numbers x 1'  Vertical saccades by reading words x 1'  Horizontal smooth pursuits by counting shapes/colors x 1' Vertical smooth pursuits by identifying animal silhouettes x 1' Horizontal  VOR 1 by identifying colors, 80 bpm metronome x 1' Horizontal VOR 1 by reading words, 80 bpm metronome, x 1' VOR  2, identifying numbers x 1' VOR cancellation by identifying numbers x 1' VOR cancellation by identifying colors x 1'   01/22/23 Seated:           Cervical flex/ext, rot AROM x 10 x 2 on each as warm-up           Horizontal saccades by identifying numbers x 1'           Vertical saccades by reading words x 1'           Horizontal smooth pursuits by counting shapes/colors x 1' Vertical smooth pursuits by identifying animal silhouettes x 1' Horizontal VOR 1 by identifying shapes, 70 bpm metronome x 1' Horizontal VOR 1 by reading words, 70 bpm metronome, x 1' VOR cancellation by reading words x 1' VOR cancellation by identifying shapes x 1'  01/13/23 LT BBQ roll for horizontal canal per previous assessment (reports mild dizziness initially but no nystagmus)  -poor transition from supine to LT sidelying   Seated head turns x 10 (no sx) Seated head nods x10 (no sx)  Seated VOR 1 horizontal x10 (mild dizziness, difficulty tracking to LT)   01/08/23 Evaluation and patient education done  PATIENT EDUCATION: Education details: Updated HEP Person educated: Patient Education method: Explanation, Demonstration, and Handouts Education comprehension: verbalized understanding, returned demonstration, and needs further education  HOME EXERCISE PROGRAM: 02/11/23 Access Code: [email or web address] URL: [email or web address] - Heel Toe Raises with Counter Support  - 2 x daily - 7 x weekly - 2 sets - 10 reps - Standing Tandem Balance with Counter Support  - 2 x daily - 7 x weekly - 2 reps - 30 hold  01/22/23 - done 2-3x/day, 5-7 days/week (written copy provided and reviewed)  Exercise 1 (Horizontal Saccades) Use two cards for this exercise While holding the cards on each hand side by side in sitting with arms outstretched, look at the first item on the other card then look at the next  item on the second card then alternate your gaze sequentially. Do NOT move your cards nor the your head. Do the following for one minute each: Identifying numbers Reading words Identifying shapes  Exercise 2 (Paradigm x1) Use only one card for this exercise While holding the card in sitting with your arm outstretched, turn your head left and right at your own pace while keeping the card stationary.  Do this for one minute each while you are: Identifying numbers Reading words Identifying shapes  Exercise 4 (VOR cancellation) Use only one card for this exercise While holding the card in sitting with your arms outstretched, turn your body, arm, and head at the same time left and right at your own pace Do this for one minute each while you are: Identifying numbers Reading words Identifying shapes   Access Code: [email or web address] URL: [email or web address] Date: 01/13/2023 Prepared by: Josue Hector  Exercises - Seated Gaze Stabilization with Head Rotation  - 3 x daily - 7 x weekly - 3 sets - 10 reps - Seated Left Head Turns Vestibular Habituation  - 3 x daily - 7 x weekly - 3 sets - 10 reps  GOALS: Goals reviewed with patient? Yes  SHORT TERM GOALS: Target date: 02/05/23  Pt will demonstrate indep in HEP to facilitate carry-over of skilled services and improve functional outcomes  Goal status: IN PROGRESS  2.  Pt will demonstrate a negative L Dix-Hallpike to facilitate ease in bed mobility Baseline: positive Goal status: IN PROGRESS   LONG TERM GOALS: Target date: 03/05/23  Pt will improve Motion sensitivity quotient to 0 (turning in standing) in order to demonstrate clinically significant improvement in balance and decreased risk for falls Baseline: 3 Goal status: INITIAL  2.  Pt will improve DGI by at least 3 points in order to demonstrate clinically significant improvement in balance and decreased risk for falls  Baseline: 13 Goal status:  INITIAL  ASSESSMENT:  CLINICAL IMPRESSION: Performed supine roll test to assess the presence of vertigo at the beginning of the session and patient presented with normal findings (no nystagmus seen and no vertigo reported). Remaining interventions today were geared towards gaze stabilization. Tolerated all activities with no dizziness when doing the VOR 1, and the VOR cancellation activity. However, patient showed trunk backward lean especially when doing the VOR cancellation during the activity due to impaired proprioception. Demonstrated  slight levels of fatigue. Rest periods were provided. Required moderate amount of tactile and visual cueing to ensure correct execution of activity particularly in the VOR cancellation, VOR 1, and vertical saccades activities, with fair carry-over. To date, skilled PT is required to address the impairments and improve function.  OBJECTIVE IMPAIRMENTS: Abnormal gait, decreased balance, difficulty walking, dizziness, and impaired flexibility.   ACTIVITY LIMITATIONS: standing and bed mobility  PARTICIPATION LIMITATIONS: community activity  PERSONAL FACTORS: Age and Fitness are also affecting patient's functional outcome.   REHAB POTENTIAL: Fair    CLINICAL DECISION MAKING: Evolving/moderate complexity  EVALUATION COMPLEXITY: Moderate   PLAN:  PT FREQUENCY: 2x/week  PT DURATION: 8 weeks  PLANNED INTERVENTIONS: Therapeutic exercises, Therapeutic activity, Neuromuscular re-education, Balance training, Gait training, Patient/Family education, Self Care, Vestibular training, and Canalith repositioning  PLAN FOR NEXT SESSION: Continue POC and may progress as tolerated with emphasis on adaptation, habituation, and substitution activities. Recheck if patient still has vertigo on supine and L side lying positions.   Harvie Heck. Blakelee Allington, PT, DPT, OCS Board-Certified Clinical Specialist in [place] # ([place]): [id number] T 2:45 PM,  02/11/23

## 2023-02-13 ENCOUNTER — Encounter (HOSPITAL_COMMUNITY): Payer: Self-pay

## 2023-02-13 ENCOUNTER — Other Ambulatory Visit: Payer: Self-pay | Admitting: Family Medicine

## 2023-02-13 ENCOUNTER — Ambulatory Visit (HOSPITAL_COMMUNITY): Payer: Medicare PPO

## 2023-02-13 DIAGNOSIS — R42 Dizziness and giddiness: Secondary | ICD-10-CM | POA: Diagnosis not present

## 2023-02-13 DIAGNOSIS — M6281 Muscle weakness (generalized): Secondary | ICD-10-CM | POA: Diagnosis not present

## 2023-02-13 DIAGNOSIS — R2681 Unsteadiness on feet: Secondary | ICD-10-CM | POA: Diagnosis not present

## 2023-02-13 DIAGNOSIS — I10 Essential (primary) hypertension: Secondary | ICD-10-CM

## 2023-02-13 NOTE — Therapy (Signed)
OUTPATIENT PHYSICAL THERAPY VESTIBULAR TREATMENT     Patient Name: Clifford Gibson MRN: [id number] DOB:July 13, 1946, 77 y.o., male Today's Date: 02/13/2023  END OF SESSION:  PT End of Session - 02/13/23 1441     Visit Number 6    Number of Visits 16    Date for PT Re-Evaluation 03/05/23    Authorization Type [employer] (approved 16 visitis and 1 RE)    Authorization Time Period 01/08/2023-03/05/2023    Authorization - Visit Number 6    Authorization - Number of Visits 16    Progress Note Due on Visit 8    PT Start Time 1435    PT Stop Time 1515    PT Time Calculation (min) 40 min    Equipment Utilized During Treatment Gait belt    Activity Tolerance Patient tolerated treatment well    Behavior During Therapy [facility] for tasks assessed/performed            Past Medical History:  Diagnosis Date   Acute serous otitis media 02/22/2010   Arthritis    Diabetes mellitus without complication ([place])    ERECTILE DYSFUNCTION 04/25/2009   GERD 03/24/2009   HYPERLIPIDEMIA 03/28/2010   HYPERTENSION 03/24/2009   OBESITY, MODERATE 04/25/2009   PONV (postoperative nausea and vomiting)    Past Surgical History:  Procedure Laterality Date   COLONOSCOPY N/A 06/21/2015   Procedure: COLONOSCOPY;  Surgeon: Rogene Houston, MD;  Location: [facility];  Service: Endoscopy;  Laterality: N/A;  930 - moved to 7/20 @ 7:30   NASAL SEPTUM SURGERY  1974   TONSILLECTOMY     3 yrs. old   TOTAL KNEE ARTHROPLASTY  06/11/2012   Procedure: TOTAL KNEE ARTHROPLASTY;  Surgeon: Johnn Hai, MD;  Location: [facility];  Service: Orthopedics;  Laterality: Right;   Patient Active Problem List   Diagnosis Date Noted   Mild cognitive impairment 06/14/2022   Rhinorrhea 06/14/2022   Type 2 diabetes mellitus, controlled ([place]) 07/04/2015   Hyperglycemia 02/02/2015   Allergic rhinitis 05/14/2013   Hyperlipemia 03/28/2010   ACUTE SEROUS OTITIS MEDIA 02/22/2010   OBESITY, MODERATE 04/25/2009   ERECTILE  DYSFUNCTION 04/25/2009   Essential hypertension 03/24/2009   GERD 03/24/2009    PCP: Eulas Post, MD  REFERRING PROVIDER: Leta Baptist  REFERRING DIAG: dizziness  THERAPY DIAG:  Dizziness and giddiness  Unsteadiness on feet  ONSET DATE: Years ago  Rationale for Evaluation and Treatment: Rehabilitation  SUBJECTIVE:   SUBJECTIVE STATEMENT: Patient states that he's sore on the R foot without apparent reason. Patient states that he has tendinitis and will see his podiatrist for it. Patient states that he doesn't have any episodes of dizziness even when he lays on his L side or looks for objects  Denies dizziness at the moment. However, patient states that when he moves fast (e.g. turning to the L/R), he gets dizzy. Patient denies any spinning sensation or lightheadedness. However, laying on his L side makes the room "spin". Condition started years ago. Cannot associate any precipitating event but patient claims that he's prone to frequent sinus infections and were treated by antiobiotics in the past which addressed the infection.Patient recalls that when he was 4, he fell and hit his head at that time which made him dizzy but the dizziness went away. Last month, patient had a test about his dizziness and was told by his physician that the issue is not coming from the inner ear but on the brain itself. Patient was then referred  to outpatient PT evaluation and management for dizziness. Patient was just recently D/C from another outpatient PT where he was seen for LE weakness. Pt accompanied by: self  PERTINENT HISTORY: R TKA, recurrent sinus infections, LE weakness  PAIN:  Are you having pain? None  PRECAUTIONS: None  WEIGHT BEARING RESTRICTIONS: No  FALLS: Has patient fallen in last 6 months? No  LIVING ENVIRONMENT: Lives with: lives with their spouse Lives in: House/apartment Stairs: No Has following equipment at home: Lobbyist, Environmental consultant - 2 wheeled, Electronics engineer,  and Grab bars  PLOF: Independent  PATIENT GOALS: "I want something that would help me compensate and train this portion of the brain so I will be less dizzy"  OBJECTIVE:   DIAGNOSTIC FINDINGS:   MR Brain WO Contrast 06/17/2022 IMPRESSION: 1. Mildly motion degraded exam. 2. No evidence of acute intracranial abnormality. 3. Mild-to-moderate chronic small vessel image changes cerebral white matter, stable from the recent prior MRI of 05/11/2022. 4. Mild-to-moderate generalized cerebral atrophy. Comparatively mild cerebellar atrophy. 5. Paranasal sinus disease, as described. 6. Small-volume fluid within the bilateral mastoid air cells.  COGNITION: Overall cognitive status: Within functional limits for tasks assessed   SENSATION: Not tested  POSTURE:  rounded shoulders and forward head  Cervical ROM:    Active A/PROM (deg) eval  Flexion WFL  Extension 25%  Right lateral flexion   Left lateral flexion   Right rotation 50%  Left rotation 5)%  (Blank rows = not tested)  LOWER EXTREMITY MMT:   MMT Right eval Left eval  Hip flexion 4+ 4+  Hip abduction 4+ 4+  Hip adduction    Hip internal rotation    Hip external rotation    Knee flexion 4+ 4+  Knee extension 4+ 4+  Ankle dorsiflexion 4+ 4+  Ankle plantarflexion 4+ 4+  Ankle inversion    Ankle eversion    (Blank rows = not tested)  BED MOBILITY:  Sit to supine Complete Independence  TRANSFERS: Assistive device utilized: None  Sit to stand: Complete Independence Stand to sit: Complete Independence  GAIT: Gait pattern: step through pattern, decreased arm swing- Right, decreased arm swing- Left, decreased stride length, decreased hip/knee flexion- Right, decreased hip/knee flexion- Left, and narrow BOS Distance walked: 20 Assistive device utilized: None Level of assistance: Complete Independence   FUNCTIONAL TESTS:  DGI 1. Gait level surface (2) Mild Impairment: Walks 20', uses assistive devices,  slower speed, mild gait deviations. 2. Change in gait speed (2) Mild Impairment: Is able to change speed but demonstrates mild gait deviations, or not gait deviations but unable to achieve a significant change in velocity, or uses an assistive device. 3. Gait with horizontal head turns (1) Moderate Impairment: Performs head turns with moderate change in gait velocity, slows down, staggers but recovers, can continue to walk. 4. Gait with vertical head turns 1) Moderate Impairment: Performs head turns with moderate change in gait velocity, slows down, staggers but recovers, can continue to walk. 5. Gait and pivot turn (2) Mild Impairment: Pivot turns safely in > 3 seconds and stops with no loss of balance. 6. Step over obstacle (1) Moderate Impairment: Is able to step over box but must stop, then step over. May require verbal cueing. 7. Step around obstacles (2) Mild Impairment: Is able to step around both cones, but must slow down and adjust steps to clear cones. 8. Stairs (2) Mild Impairment: Alternating feet, must use rail.  TOTAL SCORE: 13 / 24   VESTIBULAR ASSESSMENT:  SYMPTOM BEHAVIOR:  Type of dizziness: Blurred Vision, Imbalance (Disequilibrium), Unsteady with head/body turns, and "Funny feeling in the head"  Aggravating factors: Induced by motion: turning body quickly, turning head quickly, and sitting in a moving car and Worse outside or in busy environment  Relieving factors: rest  Progression of symptoms: unchanged  OCULOMOTOR EXAM:  Ocular Alignment: normal  Ocular ROM: No Limitations  Spontaneous Nystagmus: absent  Gaze-Induced Nystagmus: absent  Smooth Pursuits: intact  Saccades: hypometric/undershoots  Intact CN 3,4,6 on B  FRENZEL - FIXATION SUPRESSED: Positional tests: Right Dix-Hallpike: no nystagmus Left Dix-Hallpike: no nystagmus observed but reported of "spinning sensation" (vertigo) Right Roll Test: no nystagmus Left Roll Test: no nystagmus but reported  vertigo    VESTIBULAR - OCULAR REFLEX:   VOR Cancellation: Corrective Saccades  Head-Impulse Test: HIT Right: positive HIT Left: positive VOR 1 (horizontal): impaired, dizziness reported    MOTION SENSITIVITY:  Motion Sensitivity Quotient Intensity: 0 = none, 1 = Lightheaded, 2 = Mild, 3 = Moderate, 4 = Severe, 5 = Vomiting  Intensity  1. Sitting to supine   2. Supine to L side   3. Supine to R side   4. Supine to sitting   5. L Hallpike-Dix   6. Up from L    7. R Hallpike-Dix   8. Up from R    9. Sitting, head tipped to L knee   10. Head up from L knee   11. Sitting, head tipped to R knee   12. Head up from R knee   13. Sitting head turns x5   14.Sitting head nods x5   15. In stance, 180 turn to L  3  16. In stance, 180 turn to R 3    (Blank rows = not tested) (+) Rhomberg test (-) Active VBI test on B  VESTIBULAR TREATMENT:                                                                                                   DATE: 02/13/23 Standing on firm surface, normal BOS:  Cervical flex/ext, rot AROM x 10 x 2 on each as warm-up  Horizontal saccades by identifying numbers x 1'  Vertical saccades by identifying colors x 1'  Vertical smooth pursuits by counting shapes/colors x 1' Horizontal smooth pursuits by identifying animal silhouettes x 1' Horizontal VOR 1 by identifying colors, own pace x 1' Horizontal VOR 1 by reading colors on a colored text, own pace, x 1' VOR 2, identifying numbers, own pace x 1' VOR cancellation by reading words x 1' VOR cancellation by identifying colors x 1' Rhythmic stabilization x 4" x 10 x 2, ant/post, eyes closed Tandem stance x 30" x 2 on each Static standing, eyes closed, narrow BOS x 30"  02/11/23 Standing on firm surface, normal BOS:  Cervical flex/ext, rot AROM x 10 x 2 on each as warm-up  Horizontal saccades by identifying numbers x 1'  Vertical saccades by reading words x 1'  Horizontal smooth pursuits by counting  shapes/colors x 1' Vertical smooth pursuits by identifying animal silhouettes x 1' Horizontal  VOR 1 by identifying colors, own pace x 1' Horizontal VOR 1 by reading words, own pace, x 1' VOR cancellation by identifying numbers x 1' VOR cancellation by identifying colors x 1' Rhythmic stabilization x 4" x 10 x 2, ant/post, eyes open Tandem stance x 30" x 2 on each  02/06/23 Casani maneuver for the L horizontal SCC x 1 rep Seated:  Cervical flex/ext, rot AROM x 10 x 2 on each as warm-up  Horizontal saccades by identifying numbers x 1'  Vertical saccades by reading words x 1'  Horizontal smooth pursuits by counting shapes/colors x 1' Vertical smooth pursuits by identifying animal silhouettes x 1' Horizontal VOR 1 by identifying colors, 80 bpm metronome x 1' Horizontal VOR 1 by reading words, 80 bpm metronome, x 1' VOR 2, identifying numbers x 1' VOR cancellation by identifying numbers x 1' VOR cancellation by identifying colors x 1'   01/22/23 Seated:           Cervical flex/ext, rot AROM x 10 x 2 on each as warm-up           Horizontal saccades by identifying numbers x 1'           Vertical saccades by reading words x 1'           Horizontal smooth pursuits by counting shapes/colors x 1' Vertical smooth pursuits by identifying animal silhouettes x 1' Horizontal VOR 1 by identifying shapes, 70 bpm metronome x 1' Horizontal VOR 1 by reading words, 70 bpm metronome, x 1' VOR cancellation by reading words x 1' VOR cancellation by identifying shapes x 1'  01/13/23 LT BBQ roll for horizontal canal per previous assessment (reports mild dizziness initially but no nystagmus)  -poor transition from supine to LT sidelying   Seated head turns x 10 (no sx) Seated head nods x10 (no sx)  Seated VOR 1 horizontal x10 (mild dizziness, difficulty tracking to LT)   01/08/23 Evaluation and patient education done  PATIENT EDUCATION: Education details: Updated HEP Person educated:  Patient Education method: Explanation, Demonstration, and Handouts Education comprehension: verbalized understanding, returned demonstration, and needs further education  HOME EXERCISE PROGRAM: 02/11/23 Access Code: [email or web address] URL: [email or web address] - Heel Toe Raises with Counter Support  - 2 x daily - 7 x weekly - 2 sets - 10 reps - Standing Tandem Balance with Counter Support  - 2 x daily - 7 x weekly - 2 reps - 30 hold  01/22/23 - done 2-3x/day, 5-7 days/week (written copy provided and reviewed)  Exercise 1 (Horizontal Saccades) Use two cards for this exercise While holding the cards on each hand side by side in sitting with arms outstretched, look at the first item on the other card then look at the next item on the second card then alternate your gaze sequentially. Do NOT move your cards nor the your head. Do the following for one minute each: Identifying numbers Reading words Identifying shapes  Exercise 2 (Paradigm x1) Use only one card for this exercise While holding the card in sitting with your arm outstretched, turn your head left and right at your own pace while keeping the card stationary.  Do this for one minute each while you are: Identifying numbers Reading words Identifying shapes  Exercise 4 (VOR cancellation) Use only one card for this exercise While holding the card in sitting with your arms outstretched, turn your body, arm, and head at the same time left and right at your own pace Do this for one minute  each while you are: Identifying numbers Reading words Identifying shapes   Access Code: [email or web address] URL: [email or web address] Date: 01/13/2023 Prepared by: Josue Hector  Exercises - Seated Gaze Stabilization with Head Rotation  - 3 x daily - 7 x weekly - 3 sets - 10 reps - Seated Left Head Turns Vestibular Habituation  - 3 x daily - 7 x weekly - 3 sets - 10 reps  GOALS: Goals reviewed with patient? Yes  SHORT TERM  GOALS: Target date: 02/05/23  Pt will demonstrate indep in HEP to facilitate carry-over of skilled services and improve functional outcomes  Goal status: IN PROGRESS  2.  Pt will demonstrate a negative L Dix-Hallpike to facilitate ease in bed mobility Baseline: positive Goal status: IN PROGRESS   LONG TERM GOALS: Target date: 03/05/23  Pt will improve Motion sensitivity quotient to 0 (turning in standing) in order to demonstrate clinically significant improvement in balance and decreased risk for falls Baseline: 3 Goal status: INITIAL  2.  Pt will improve DGI by at least 3 points in order to demonstrate clinically significant improvement in balance and decreased risk for falls  Baseline: 13 Goal status: INITIAL  ASSESSMENT:  CLINICAL IMPRESSION: Interventions today were geared towards gaze stabilization. Tolerated all activities with no dizziness when doing the VOR 1, VOR 2 and the VOR cancellation activity. Patient only showed very little trunk backward lean on today's activity due to impaired proprioception. Patient also demonstrated significant lat lean on tandem stance due to impaired proprioception. Demonstrated slight levels of fatigue. Rest periods were provided. Required moderate amount of tactile and visual cueing to ensure correct execution of activity with good carry-over except on the VOR 2 and cancellation activity where patient had poor carry-over. To date, skilled PT is required to address the impairments and improve function.  OBJECTIVE IMPAIRMENTS: Abnormal gait, decreased balance, difficulty walking, dizziness, and impaired flexibility.   ACTIVITY LIMITATIONS: standing and bed mobility  PARTICIPATION LIMITATIONS: community activity  PERSONAL FACTORS: Age and Fitness are also affecting patient's functional outcome.   REHAB POTENTIAL: Fair    CLINICAL DECISION MAKING: Evolving/moderate complexity  EVALUATION COMPLEXITY: Moderate   PLAN:  PT FREQUENCY:  2x/week  PT DURATION: 8 weeks  PLANNED INTERVENTIONS: Therapeutic exercises, Therapeutic activity, Neuromuscular re-education, Balance training, Gait training, Patient/Family education, Self Care, Vestibular training, and Canalith repositioning  PLAN FOR NEXT SESSION: Continue POC and may progress as tolerated with emphasis on adaptation, habituation, and substitution activities. Recheck if patient still has vertigo on supine and L side lying positions.   Harvie Heck. Jaamal Farooqui, PT, DPT, OCS Board-Certified Clinical Specialist in [place] # ([place]): [phone] T 3:18 PM, 02/13/23

## 2023-02-18 ENCOUNTER — Ambulatory Visit (HOSPITAL_COMMUNITY): Payer: Medicare PPO

## 2023-02-18 DIAGNOSIS — R2681 Unsteadiness on feet: Secondary | ICD-10-CM

## 2023-02-18 DIAGNOSIS — R42 Dizziness and giddiness: Secondary | ICD-10-CM | POA: Diagnosis not present

## 2023-02-18 DIAGNOSIS — M6281 Muscle weakness (generalized): Secondary | ICD-10-CM | POA: Diagnosis not present

## 2023-02-18 NOTE — Therapy (Signed)
OUTPATIENT PHYSICAL THERAPY VESTIBULAR TREATMENT     Patient Name: Clifford Gibson MRN: [id number] DOB:03/01/1946, 77 y.o., male Today's Date: 02/18/2023  END OF SESSION:  PT End of Session - 02/18/23 1528     Visit Number 7    Number of Visits 16    Date for PT Re-Evaluation 03/05/23    Authorization Type [employer] (approved 16 visitis and 1 RE)    Authorization Time Period 01/08/2023-03/05/2023    Authorization - Visit Number 7    Authorization - Number of Visits 16    Progress Note Due on Visit 8    PT Start Time 1430    PT Stop Time 1515    PT Time Calculation (min) 45 min    Equipment Utilized During Treatment Gait belt    Activity Tolerance Patient tolerated treatment well    Behavior During Therapy [facility] for tasks assessed/performed            Past Medical History:  Diagnosis Date   Acute serous otitis media 02/22/2010   Arthritis    Diabetes mellitus without complication ([place])    ERECTILE DYSFUNCTION 04/25/2009   GERD 03/24/2009   HYPERLIPIDEMIA 03/28/2010   HYPERTENSION 03/24/2009   OBESITY, MODERATE 04/25/2009   PONV (postoperative nausea and vomiting)    Past Surgical History:  Procedure Laterality Date   COLONOSCOPY N/A 06/21/2015   Procedure: COLONOSCOPY;  Surgeon: Rogene Houston, MD;  Location: [facility];  Service: Endoscopy;  Laterality: N/A;  930 - moved to 7/20 @ 7:30   NASAL SEPTUM SURGERY  1974   TONSILLECTOMY     3 yrs. old   TOTAL KNEE ARTHROPLASTY  06/11/2012   Procedure: TOTAL KNEE ARTHROPLASTY;  Surgeon: Johnn Hai, MD;  Location: [facility];  Service: Orthopedics;  Laterality: Right;   Patient Active Problem List   Diagnosis Date Noted   Mild cognitive impairment 06/14/2022   Rhinorrhea 06/14/2022   Type 2 diabetes mellitus, controlled ([place]) 07/04/2015   Hyperglycemia 02/02/2015   Allergic rhinitis 05/14/2013   Hyperlipemia 03/28/2010   ACUTE SEROUS OTITIS MEDIA 02/22/2010   OBESITY, MODERATE 04/25/2009   ERECTILE  DYSFUNCTION 04/25/2009   Essential hypertension 03/24/2009   GERD 03/24/2009    PCP: Eulas Post, MD  REFERRING PROVIDER: Leta Baptist  REFERRING DIAG: dizziness  THERAPY DIAG:  Dizziness and giddiness  Unsteadiness on feet  ONSET DATE: Years ago  Rationale for Evaluation and Treatment: Rehabilitation  SUBJECTIVE:   SUBJECTIVE STATEMENT: Patient states that he has been walking outdoors and he feels safe and doesn't feel unsteady. Claims that he has no episode of dizziness anymore. Also reports that looking on trees while inside a moving car doesn't bother him as much now.  Denies dizziness at the moment. However, patient states that when he moves fast (e.g. turning to the L/R), he gets dizzy. Patient denies any spinning sensation or lightheadedness. However, laying on his L side makes the room "spin". Condition started years ago. Cannot associate any precipitating event but patient claims that he's prone to frequent sinus infections and were treated by antiobiotics in the past which addressed the infection.Patient recalls that when he was 4, he fell and hit his head at that time which made him dizzy but the dizziness went away. Last month, patient had a test about his dizziness and was told by his physician that the issue is not coming from the inner ear but on the brain itself. Patient was then referred to outpatient PT evaluation and  management for dizziness. Patient was just recently D/C from another outpatient PT where he was seen for LE weakness. Pt accompanied by: self  PERTINENT HISTORY: R TKA, recurrent sinus infections, LE weakness  PAIN:  Are you having pain? None  PRECAUTIONS: None  WEIGHT BEARING RESTRICTIONS: No  FALLS: Has patient fallen in last 6 months? No  LIVING ENVIRONMENT: Lives with: lives with their spouse Lives in: House/apartment Stairs: No Has following equipment at home: Lobbyist, Environmental consultant - 2 wheeled, Electronics engineer, and Grab  bars  PLOF: Independent  PATIENT GOALS: "I want something that would help me compensate and train this portion of the brain so I will be less dizzy"  OBJECTIVE:   DIAGNOSTIC FINDINGS:   MR Brain WO Contrast 06/17/2022 IMPRESSION: 1. Mildly motion degraded exam. 2. No evidence of acute intracranial abnormality. 3. Mild-to-moderate chronic small vessel image changes cerebral white matter, stable from the recent prior MRI of 05/11/2022. 4. Mild-to-moderate generalized cerebral atrophy. Comparatively mild cerebellar atrophy. 5. Paranasal sinus disease, as described. 6. Small-volume fluid within the bilateral mastoid air cells.  COGNITION: Overall cognitive status: Within functional limits for tasks assessed   SENSATION: Not tested  POSTURE:  rounded shoulders and forward head  Cervical ROM:    Active A/PROM (deg) eval  Flexion WFL  Extension 25%  Right lateral flexion   Left lateral flexion   Right rotation 50%  Left rotation 5)%  (Blank rows = not tested)  LOWER EXTREMITY MMT:   MMT Right eval Left eval  Hip flexion 4+ 4+  Hip abduction 4+ 4+  Hip adduction    Hip internal rotation    Hip external rotation    Knee flexion 4+ 4+  Knee extension 4+ 4+  Ankle dorsiflexion 4+ 4+  Ankle plantarflexion 4+ 4+  Ankle inversion    Ankle eversion    (Blank rows = not tested)  BED MOBILITY:  Sit to supine Complete Independence  TRANSFERS: Assistive device utilized: None  Sit to stand: Complete Independence Stand to sit: Complete Independence  GAIT: Gait pattern: step through pattern, decreased arm swing- Right, decreased arm swing- Left, decreased stride length, decreased hip/knee flexion- Right, decreased hip/knee flexion- Left, and narrow BOS Distance walked: 20 Assistive device utilized: None Level of assistance: Complete Independence   FUNCTIONAL TESTS:  DGI 1. Gait level surface (2) Mild Impairment: Walks 20', uses assistive devices, slower speed,  mild gait deviations. 2. Change in gait speed (2) Mild Impairment: Is able to change speed but demonstrates mild gait deviations, or not gait deviations but unable to achieve a significant change in velocity, or uses an assistive device. 3. Gait with horizontal head turns (1) Moderate Impairment: Performs head turns with moderate change in gait velocity, slows down, staggers but recovers, can continue to walk. 4. Gait with vertical head turns 1) Moderate Impairment: Performs head turns with moderate change in gait velocity, slows down, staggers but recovers, can continue to walk. 5. Gait and pivot turn (2) Mild Impairment: Pivot turns safely in > 3 seconds and stops with no loss of balance. 6. Step over obstacle (1) Moderate Impairment: Is able to step over box but must stop, then step over. May require verbal cueing. 7. Step around obstacles (2) Mild Impairment: Is able to step around both cones, but must slow down and adjust steps to clear cones. 8. Stairs (2) Mild Impairment: Alternating feet, must use rail.  TOTAL SCORE: 13 / 24   VESTIBULAR ASSESSMENT:  SYMPTOM BEHAVIOR:  Type  of dizziness: Blurred Vision, Imbalance (Disequilibrium), Unsteady with head/body turns, and "Funny feeling in the head"  Aggravating factors: Induced by motion: turning body quickly, turning head quickly, and sitting in a moving car and Worse outside or in busy environment  Relieving factors: rest  Progression of symptoms: unchanged  OCULOMOTOR EXAM:  Ocular Alignment: normal  Ocular ROM: No Limitations  Spontaneous Nystagmus: absent  Gaze-Induced Nystagmus: absent  Smooth Pursuits: intact  Saccades: hypometric/undershoots  Intact CN 3,4,6 on B  FRENZEL - FIXATION SUPRESSED: Positional tests: Right Dix-Hallpike: no nystagmus Left Dix-Hallpike: no nystagmus observed but reported of "spinning sensation" (vertigo) Right Roll Test: no nystagmus Left Roll Test: no nystagmus but reported  vertigo    VESTIBULAR - OCULAR REFLEX:   VOR Cancellation: Corrective Saccades  Head-Impulse Test: HIT Right: positive HIT Left: positive VOR 1 (horizontal): impaired, dizziness reported    MOTION SENSITIVITY:  Motion Sensitivity Quotient Intensity: 0 = none, 1 = Lightheaded, 2 = Mild, 3 = Moderate, 4 = Severe, 5 = Vomiting  Intensity  1. Sitting to supine   2. Supine to L side   3. Supine to R side   4. Supine to sitting   5. L Hallpike-Dix   6. Up from L    7. R Hallpike-Dix   8. Up from R    9. Sitting, head tipped to L knee   10. Head up from L knee   11. Sitting, head tipped to R knee   12. Head up from R knee   13. Sitting head turns x5   14.Sitting head nods x5   15. In stance, 180 turn to L  3  16. In stance, 180 turn to R 3    (Blank rows = not tested) (+) Rhomberg test (-) Active VBI test on B  VESTIBULAR TREATMENT:                                                                                                   DATE: 02/18/23 Seated Cervical flex/ext, rot AROM x 10 x 2 on each as warm-up Standing on foam, normal BOS:  Horizontal saccades by identifying numbers x 1'  Vertical saccades by identifying colors x 1'  Vertical smooth pursuits by counting shapes/colors x 1' Horizontal smooth pursuits by identifying animal silhouettes x 1' Horizontal VOR 1 by identifying colors, own pace x 1' Horizontal VOR 1 by reading colors on a colored text, own pace, x 1' VOR 2, identifying numbers, own pace x 1' VOR cancellation by identifying shapes x 1' VOR cancellation by identifying colors x 1' Rhythmic stabilization x 3" x 10 x 2, ant/post, eyes closed Tandem stance x 30" x 2 on each Static standing, eyes closed, narrow BOS x 30"  02/13/23 Standing on firm surface, normal BOS:  Cervical flex/ext, rot AROM x 10 x 2 on each as warm-up  Horizontal saccades by identifying numbers x 1'  Vertical saccades by identifying colors x 1'  Vertical smooth pursuits by counting  shapes/colors x 1' Horizontal smooth pursuits by identifying animal silhouettes x 1' Horizontal VOR 1 by identifying colors,  own pace x 1' Horizontal VOR 1 by reading colors on a colored text, own pace, x 1' VOR 2, identifying numbers, own pace x 1' VOR cancellation by reading words x 1' VOR cancellation by identifying colors x 1' Rhythmic stabilization x 4" x 10 x 2, ant/post, eyes closed Tandem stance x 30" x 2 on each Static standing, eyes closed, narrow BOS x 30"  02/11/23 Standing on firm surface, normal BOS:  Cervical flex/ext, rot AROM x 10 x 2 on each as warm-up  Horizontal saccades by identifying numbers x 1'  Vertical saccades by reading words x 1'  Horizontal smooth pursuits by counting shapes/colors x 1' Vertical smooth pursuits by identifying animal silhouettes x 1' Horizontal VOR 1 by identifying colors, own pace x 1' Horizontal VOR 1 by reading words, own pace, x 1' VOR cancellation by identifying numbers x 1' VOR cancellation by identifying colors x 1' Rhythmic stabilization x 4" x 10 x 2, ant/post, eyes open Tandem stance x 30" x 2 on each  02/06/23 Casani maneuver for the L horizontal SCC x 1 rep Seated:  Cervical flex/ext, rot AROM x 10 x 2 on each as warm-up  Horizontal saccades by identifying numbers x 1'  Vertical saccades by reading words x 1'  Horizontal smooth pursuits by counting shapes/colors x 1' Vertical smooth pursuits by identifying animal silhouettes x 1' Horizontal VOR 1 by identifying colors, 80 bpm metronome x 1' Horizontal VOR 1 by reading words, 80 bpm metronome, x 1' VOR 2, identifying numbers x 1' VOR cancellation by identifying numbers x 1' VOR cancellation by identifying colors x 1'   01/22/23 Seated:           Cervical flex/ext, rot AROM x 10 x 2 on each as warm-up           Horizontal saccades by identifying numbers x 1'           Vertical saccades by reading words x 1'           Horizontal smooth pursuits by counting shapes/colors x  1' Vertical smooth pursuits by identifying animal silhouettes x 1' Horizontal VOR 1 by identifying shapes, 70 bpm metronome x 1' Horizontal VOR 1 by reading words, 70 bpm metronome, x 1' VOR cancellation by reading words x 1' VOR cancellation by identifying shapes x 1'  01/13/23 LT BBQ roll for horizontal canal per previous assessment (reports mild dizziness initially but no nystagmus)  -poor transition from supine to LT sidelying   Seated head turns x 10 (no sx) Seated head nods x10 (no sx)  Seated VOR 1 horizontal x10 (mild dizziness, difficulty tracking to LT)   01/08/23 Evaluation and patient education done  PATIENT EDUCATION: Education details: Updated HEP Person educated: Patient Education method: Explanation, Demonstration, and Handouts Education comprehension: verbalized understanding, returned demonstration, and needs further education  HOME EXERCISE PROGRAM: 02/11/23 Access Code: [email or web address] URL: [email or web address] - Heel Toe Raises with Counter Support  - 2 x daily - 7 x weekly - 2 sets - 10 reps - Standing Tandem Balance with Counter Support  - 2 x daily - 7 x weekly - 2 reps - 30 hold  01/22/23 - done 2-3x/day, 5-7 days/week (written copy provided and reviewed)  Exercise 1 (Horizontal Saccades) Use two cards for this exercise While holding the cards on each hand side by side in sitting with arms outstretched, look at the first item on the other card then look at the next item on the second card then  alternate your gaze sequentially. Do NOT move your cards nor the your head. Do the following for one minute each: Identifying numbers Reading words Identifying shapes  Exercise 2 (Paradigm x1) Use only one card for this exercise While holding the card in sitting with your arm outstretched, turn your head left and right at your own pace while keeping the card stationary.  Do this for one minute each while you are: Identifying numbers Reading  words Identifying shapes  Exercise 4 (VOR cancellation) Use only one card for this exercise While holding the card in sitting with your arms outstretched, turn your body, arm, and head at the same time left and right at your own pace Do this for one minute each while you are: Identifying numbers Reading words Identifying shapes   Access Code: [email or web address] URL: [email or web address] Date: 01/13/2023 Prepared by: Josue Hector  Exercises - Seated Gaze Stabilization with Head Rotation  - 3 x daily - 7 x weekly - 3 sets - 10 reps - Seated Left Head Turns Vestibular Habituation  - 3 x daily - 7 x weekly - 3 sets - 10 reps  GOALS: Goals reviewed with patient? Yes  SHORT TERM GOALS: Target date: 02/05/23  Pt will demonstrate indep in HEP to facilitate carry-over of skilled services and improve functional outcomes  Goal status: IN PROGRESS  2.  Pt will demonstrate a negative L Dix-Hallpike to facilitate ease in bed mobility Baseline: positive Goal status: IN PROGRESS   LONG TERM GOALS: Target date: 03/05/23  Pt will improve Motion sensitivity quotient to 0 (turning in standing) in order to demonstrate clinically significant improvement in balance and decreased risk for falls Baseline: 3 Goal status: INITIAL  2.  Pt will improve DGI by at least 3 points in order to demonstrate clinically significant improvement in balance and decreased risk for falls  Baseline: 13 Goal status: INITIAL  ASSESSMENT:  CLINICAL IMPRESSION: Interventions today were geared towards gaze stabilization and balance training on unstable surfaces. Tolerated all activities with no dizziness when doing the VOR 1, VOR 2 and the VOR cancellation activity. Patient only showed significant posterior lean and lateral lean to the R on today's activity due to impaired proprioception particularly on all gaze stabilization exercises except for the saccadic activity. Demonstrated slight levels of fatigue. Rest  periods were provided. Still required moderate amount of tactile and visual cueing to ensure correct execution of activity with good carry-over except on the VOR 2 and cancellation activity where patient had poor carry-over. To date, skilled PT is required to address the impairments and improve function.  OBJECTIVE IMPAIRMENTS: Abnormal gait, decreased balance, difficulty walking, dizziness, and impaired flexibility.   ACTIVITY LIMITATIONS: standing and bed mobility  PARTICIPATION LIMITATIONS: community activity  PERSONAL FACTORS: Age and Fitness are also affecting patient's functional outcome.   REHAB POTENTIAL: Fair    CLINICAL DECISION MAKING: Evolving/moderate complexity  EVALUATION COMPLEXITY: Moderate   PLAN:  PT FREQUENCY: 2x/week  PT DURATION: 8 weeks  PLANNED INTERVENTIONS: Therapeutic exercises, Therapeutic activity, Neuromuscular re-education, Balance training, Gait training, Patient/Family education, Self Care, Vestibular training, and Canalith repositioning  PLAN FOR NEXT SESSION: Reassess next visit   Shelisha Gautier L. Kryslyn Helbig, PT, DPT, OCS Board-Certified Clinical Specialist in [place] # ([place]): [phone] T 3:29 PM, 02/18/23

## 2023-02-20 ENCOUNTER — Ambulatory Visit (HOSPITAL_COMMUNITY): Payer: Medicare PPO

## 2023-02-20 ENCOUNTER — Telehealth: Payer: Self-pay

## 2023-02-20 DIAGNOSIS — R2681 Unsteadiness on feet: Secondary | ICD-10-CM | POA: Diagnosis not present

## 2023-02-20 DIAGNOSIS — M6281 Muscle weakness (generalized): Secondary | ICD-10-CM | POA: Diagnosis not present

## 2023-02-20 DIAGNOSIS — R42 Dizziness and giddiness: Secondary | ICD-10-CM

## 2023-02-20 NOTE — Progress Notes (Addendum)
Care Management & Coordination Services Pharmacy Team  Reason for Encounter: Hypertension  Contacted patient to discuss hypertension disease state. Spoke with patient on 02/20/2023   Current antihypertensive regimen:  Valsartan HCT 160/12.5 mg at bedtime Patient verbally confirms he is taking the above medications as directed. Yes  How often are you checking your Blood Pressure? Patient states he is checking blood pressures weekly  he checks his blood pressure at various times  Current home BP readings: Patient states his readings are always around 128/78  Wrist or arm cuff: Arm cuff, recently got a wrist cuff  OTC medications including pseudoephedrine or NSAIDs? Patient denies  Any readings above 180/100? Patient denies  What recent interventions/DTPs have been made by any provider to improve Blood Pressure control since last CPP Visit: No recent interventions   Any recent hospitalizations or ED visits since last visit with CPP? No recent hospital visits.   What diet changes have been made to improve Blood Pressure Control?  Patient follows low sodium, home cooked meals, nothing processed Breakfast - sausage biscuit or cereal Lunch - fruit Dinner - meat and vegetables Caffeine intake - 1 cup coffee Salt intake - doesn't add salt  What exercise is being done to improve your Blood Pressure Control?  Patient stays busy daily and does his own yard work  Adherence Review: Is the patient currently on ACE/ARB medication? Yes Does the patient have >5 day gap between last estimated fill dates? No  Care Gaps: AWV - scheduled 01/30/2024 Next appointment 05/23/2023 Last eye exam - 05/02/2022 Last foot exam - 02/19/2021 Last A1C - 6.0 on 12/06/2022 Covid - overdue   Star Rating Drug: Metformin 500 mg - last filled 02/04/2023 30 DS at [facility]. Diovan HCT 160/12.5 mg - last filled 02/14/2023 90 DS at [facility].  Chart Updates: Recent office visits:  12/06/2022  [name] MD - Patient was seen for cough and additional concerns. Started Mirabegron 25 mg daily.   Recent consult visits:  None  Hospital visits:  None  Medications: Outpatient Encounter Medications as of 02/20/2023  Medication Sig   ACCU-CHEK FASTCLIX LANCETS MISC Test once daily Dx E11.9   Apoaequorin (PREVAGEN PO) Take 1 capsule by mouth daily.   Apple Cider Vinegar 600 MG CAPS Take 1 capsule by mouth daily.   aspirin EC 81 MG tablet Take 81 mg by mouth daily.   Cholecalciferol (VITAMIN D) 50 MCG (2000 UT) CAPS Take 1 capsule by mouth daily.   docusate sodium (COLACE) 250 MG capsule Take 250 mg by mouth in the morning and at bedtime.   donepezil (ARICEPT) 10 MG tablet TAKE ONE TABLET AT BEDTIME   Garlic 123XX123 MG CAPS Take 1 capsule by mouth daily.   glucose blood (ACCU-CHEK AVIVA) test strip Test once daily. E11.9   ipratropium (ATROVENT) 0.03 % nasal spray Place 2 sprays into both nostrils daily.   metFORMIN (GLUCOPHAGE) 500 MG tablet TAKE 2 TABLETS TWO TIMES DAILY WITH A MEAL   mirabegron ER (MYRBETRIQ) 25 MG TB24 tablet Take 1 tablet (25 mg total) by mouth daily.   pantoprazole (PROTONIX) 40 MG tablet Take 1 tablet (40 mg total) by mouth daily.   Psyllium 100 % PACK Take 1 packet by mouth daily.   REPATHA SURECLICK [id number] MG/ML SOAJ INJECT 140MG  (1ML) EVERY 14 DAYS   valsartan-hydrochlorothiazide (DIOVAN-HCT) 160-12.5 MG tablet TAKE ONE TABLET AT BEDTIME   Zinc 25 MG TABS Take 1 tablet by mouth daily.   No facility-administered encounter medications on  file as of 02/20/2023.  Fill History:  Dispensed Days Supply Quantity Provider Pharmacy  donepezil 10 mg tablet 02/14/2023 60 60 each      Dispensed Days Supply Quantity Provider Pharmacy  Repatha SureClick [id number] mg/mL subcutaneous pen injector 01/09/2023 90 6 mL      Dispensed Days Supply Quantity Provider Pharmacy  ipratropium bromide 42 mcg (0.06 %) nasal spray 09/20/2022 30 15 mL      Dispensed Days Supply Quantity  Provider Pharmacy  metformin 500 mg tablet 02/04/2023 30 120 each      Dispensed Days Supply Quantity Provider Pharmacy  Myrbetriq 25 mg tablet,extended release 02/04/2023 30 30 each      Dispensed Days Supply Quantity Provider Pharmacy  pantoprazole 40 mg tablet,delayed release 02/13/2023 90 90 each     valsartan 160 mg-hydrochlorothiazide 12.5 mg tablet 02/14/2023 90 90 each    Recent Office Vitals: BP Readings from Last 3 Encounters:  12/06/22 120/72  11/19/22 128/78  06/14/22 (!) 165/94   Pulse Readings from Last 3 Encounters:  12/06/22 82  06/14/22 86  05/24/22 82    Wt Readings from Last 3 Encounters:  01/20/23 215 lb (97.5 kg)  12/06/22 215 lb 3.2 oz (97.6 kg)  06/14/22 215 lb (97.5 kg)     Kidney Function Lab Results  Component Value Date/Time   CREATININE 1.41 04/16/2022 03:31 PM   CREATININE 1.16 02/19/2021 09:40 AM   GFR 48.13 (L) 04/16/2022 03:31 PM   GFRNONAA 84 (L) 06/12/2012 04:32 AM   GFRAA >90 06/12/2012 04:32 AM       Latest Ref Rng & Units 04/16/2022    3:31 PM 02/19/2021    9:40 AM 04/25/2020    8:54 AM  BMP  Glucose 70 - 99 mg/dL 88  135  129   BUN 6 - 23 mg/dL 28  26  21    Creatinine 0.40 - 1.50 mg/dL 1.41  1.16  1.13   Sodium 135 - 145 mEq/L 139  141  139   Potassium 3.5 - 5.1 mEq/L 4.4  4.2  4.3   Chloride 96 - 112 mEq/L 100  101  100   CO2 19 - 32 mEq/L 28  30  32   Calcium 8.4 - 10.5 mg/dL 10.0  9.9  10.1    [place] Pharmacist Assistant [phone]

## 2023-02-20 NOTE — Therapy (Signed)
OUTPATIENT PHYSICAL THERAPY VESTIBULAR DISCHARGE/PROGRESS NOTE     Patient Name: Clifford Gibson MRN: [id number] DOB:[birth year], 77 y.o., male Today's Date: 02/20/2023 PHYSICAL THERAPY DISCHARGE SUMMARY  Visits from Start of Care: 8  Current functional level related to goals / functional outcomes: See below   Remaining deficits: See below   Education / Equipment: See below   Patient agrees to discharge. Patient goals were partially met. Patient is being discharged due to the patient's request.   Progress Note Reporting Period 01/08/2023 to 02/20/2023  See note below for Objective Data and Assessment of Progress/Goals.     END OF SESSION:  PT End of Session - 02/20/23 1630     Visit Number 8    Number of Visits 16    Date for PT Re-Evaluation 03/05/23    Authorization Type [employer] (approved 16 visitis and 1 RE)    Authorization Time Period 01/08/2023-03/05/2023    Authorization - Visit Number 8    Authorization - Number of Visits 16    Progress Note Due on Visit 8    PT Start Time 1430    PT Stop Time 1515    PT Time Calculation (min) 45 min    Equipment Utilized During Treatment Gait belt    Activity Tolerance Patient tolerated treatment well    Behavior During Therapy WFL for tasks assessed/performed            Past Medical History:  Diagnosis Date   Acute serous otitis media 02/22/2010   Arthritis    Diabetes mellitus without complication ([facility])    ERECTILE DYSFUNCTION 04/25/2009   GERD 03/24/2009   HYPERLIPIDEMIA 03/28/2010   HYPERTENSION 03/24/2009   OBESITY, MODERATE 04/25/2009   PONV (postoperative nausea and vomiting)    Past Surgical History:  Procedure Laterality Date   COLONOSCOPY N/A 06/21/2015   Procedure: COLONOSCOPY;  Surgeon: Rogene Houston, MD;  Location: [facility];  Service: Endoscopy;  Laterality: N/A;  930 - moved to 7/20 @ 7:30   NASAL SEPTUM SURGERY  1974   TONSILLECTOMY     3 yrs. old   TOTAL KNEE ARTHROPLASTY   06/11/2012   Procedure: TOTAL KNEE ARTHROPLASTY;  Surgeon: Johnn Hai, MD;  Location: [facility];  Service: Orthopedics;  Laterality: Right;   Patient Active Problem List   Diagnosis Date Noted   Mild cognitive impairment 06/14/2022   Rhinorrhea 06/14/2022   Type 2 diabetes mellitus, controlled ([place]) 07/04/2015   Hyperglycemia 02/02/2015   Allergic rhinitis 05/14/2013   Hyperlipemia 03/28/2010   ACUTE SEROUS OTITIS MEDIA 02/22/2010   OBESITY, MODERATE 04/25/2009   ERECTILE DYSFUNCTION 04/25/2009   Essential hypertension 03/24/2009   GERD 03/24/2009    PCP: Eulas Post, MD  REFERRING PROVIDER: Leta Baptist  REFERRING DIAG: dizziness  THERAPY DIAG:  Dizziness and giddiness  Unsteadiness on feet  ONSET DATE: Years ago  Rationale for Evaluation and Treatment: Rehabilitation  SUBJECTIVE:   SUBJECTIVE STATEMENT: Patient denies dizziness/vertigo even when he lays on his L side. Even when he's looking outside the fast moving objects when in a moving car, patient does not get dizzy. Patient thinks that his balance is a lot better. Patient thinks that PT has helped him when it comes to his dizziness and balance and that he's good to go and not have PT anymore. Patient had hx of R TKR and the L knee is getting painful when he walks (4/10 on NPRS)  Denies dizziness at the moment. However, patient states that when  he moves fast (e.g. turning to the L/R), he gets dizzy. Patient denies any spinning sensation or lightheadedness. However, laying on his L side makes the room "spin". Condition started years ago. Cannot associate any precipitating event but patient claims that he's prone to frequent sinus infections and were treated by antiobiotics in the past which addressed the infection.Patient recalls that when he was 4, he fell and hit his head at that time which made him dizzy but the dizziness went away. Last month, patient had a test about his dizziness and was told by his physician that  the issue is not coming from the inner ear but on the brain itself. Patient was then referred to outpatient PT evaluation and management for dizziness. Patient was just recently D/C from another outpatient PT where he was seen for LE weakness. Pt accompanied by: self  PERTINENT HISTORY: R TKA, recurrent sinus infections, LE weakness  PAIN:  Are you having pain? None  PRECAUTIONS: None  WEIGHT BEARING RESTRICTIONS: No  FALLS: Has patient fallen in last 6 months? No  LIVING ENVIRONMENT: Lives with: lives with their spouse Lives in: House/apartment Stairs: No Has following equipment at home: Lobbyist, Environmental consultant - 2 wheeled, Electronics engineer, and Grab bars  PLOF: Independent  PATIENT GOALS: "I want something that would help me compensate and train this portion of the brain so I will be less dizzy"  OBJECTIVE:   DIAGNOSTIC FINDINGS:   MR Brain WO Contrast 06/17/2022 IMPRESSION: 1. Mildly motion degraded exam. 2. No evidence of acute intracranial abnormality. 3. Mild-to-moderate chronic small vessel image changes cerebral white matter, stable from the recent prior MRI of 05/11/2022. 4. Mild-to-moderate generalized cerebral atrophy. Comparatively mild cerebellar atrophy. 5. Paranasal sinus disease, as described. 6. Small-volume fluid within the bilateral mastoid air cells.  COGNITION: Overall cognitive status: Within functional limits for tasks assessed   SENSATION: Not tested  POSTURE:  rounded shoulders and forward head  Cervical ROM:    Active A/PROM (deg) eval AROM 02/20/23  Flexion [facility] WFL  Extension 25% WFL  Right lateral flexion    Left lateral flexion    Right rotation 50% WFL  Left rotation 50% WFL  (Blank rows = not tested)  LOWER EXTREMITY MMT:   MMT Right Eval & 02/20/23 Left Eval & 02/20/23  Hip flexion 4+ 4+  Hip abduction 4+ 4+  Hip adduction    Hip internal rotation    Hip external rotation    Knee flexion 4+ 4+  Knee extension 4+ 4+   Ankle dorsiflexion 4+ 4+  Ankle plantarflexion 4+ 4+  Ankle inversion    Ankle eversion    (Blank rows = not tested)  BED MOBILITY:  Sit to supine Complete Independence  TRANSFERS: Assistive device utilized: None  Sit to stand: Complete Independence Stand to sit: Complete Independence  GAIT: Gait pattern: step through pattern, decreased arm swing- Right, decreased arm swing- Left, decreased stride length, decreased hip/knee flexion- Right, decreased hip/knee flexion- Left, and wide BOS Distance walked: 20 Assistive device utilized: None Level of assistance: Complete Independence   FUNCTIONAL TESTS:  5x STS: 12.48 sec (02/20/23) 2MWT: 448 ft (02/20/23)  DGI (02/20/23) 1. Gait level surface (2) Mild Impairment: Walks 20', uses assistive devices, slower speed, mild gait deviations. 2. Change in gait speed (2) Mild Impairment: Is able to change speed but demonstrates mild gait deviations, or not gait deviations but unable to achieve a significant change in velocity, or uses an assistive device. 3. Gait with  horizontal head turns (2) Mild Impairment: Performs head turns smoothly with slight change in gait velocity, i.e., minor disruption to smooth gait path or uses walking aid. 4. Gait with vertical head turns (2) Mild Impairment: Performs head turns smoothly with slight change in gait velocity, i.e., minor disruption to smooth gait path or uses walking aid. 5. Gait and pivot turn (2) Mild Impairment: Pivot turns safely in > 3 seconds and stops with no loss of balance. 6. Step over obstacle (1) Moderate Impairment: Is able to step over box but must stop, then step over. May require verbal cueing. 7. Step around obstacles (0) Severe Impairment: Unable to clear cones, walks into one or both cones, or requires physical assistance. 8. Stairs (2) Mild Impairment: Alternating feet, must use rail.  TOTAL SCORE: 13 / 24  DGI (01/08/23) 1. Gait level surface (2) Mild Impairment: Walks  20', uses assistive devices, slower speed, mild gait deviations. 2. Change in gait speed (2) Mild Impairment: Is able to change speed but demonstrates mild gait deviations, or not gait deviations but unable to achieve a significant change in velocity, or uses an assistive device. 3. Gait with horizontal head turns (1) Moderate Impairment: Performs head turns with moderate change in gait velocity, slows down, staggers but recovers, can continue to walk. 4. Gait with vertical head turns 1) Moderate Impairment: Performs head turns with moderate change in gait velocity, slows down, staggers but recovers, can continue to walk. 5. Gait and pivot turn (2) Mild Impairment: Pivot turns safely in > 3 seconds and stops with no loss of balance. 6. Step over obstacle (1) Moderate Impairment: Is able to step over box but must stop, then step over. May require verbal cueing. 7. Step around obstacles (2) Mild Impairment: Is able to step around both cones, but must slow down and adjust steps to clear cones. 8. Stairs (2) Mild Impairment: Alternating feet, must use rail.   TOTAL SCORE: 13 / 24     VESTIBULAR ASSESSMENT:  SYMPTOM BEHAVIOR:  Type of dizziness:  none  Aggravating factors:  none  Relieving factors:  NA  Progression of symptoms: better  OCULOMOTOR EXAM:  Ocular Alignment: normal  Ocular ROM: No Limitations  Spontaneous Nystagmus: absent  Gaze-Induced Nystagmus: absent  Smooth Pursuits: intact  Saccades: intact  Intact CN 3,4,6 on B  FRENZEL - FIXATION SUPRESSED 02/20/23: Positional tests: Right Dix-Hallpike: no nystagmus, no vertigo reported Left Dix-Hallpike: no nystagmus, no vertigo reported Right Roll Test: no nystagmus, no vertigo reported Left Roll Test: no nystagmus, no vertigo reported    VESTIBULAR - OCULAR REFLEX 02/20/23:   VOR Cancellation: Normal  Head-Impulse Test: HIT Right: negative HIT Left: negative VOR 1 (horizontal): intact, no dizziness reported\  FRENZEL  - FIXATION SUPRESSED (01/08/23): Positional tests: Right Dix-Hallpike: no nystagmus Left Dix-Hallpike: no nystagmus observed but reported of "spinning sensation" (vertigo) Right Roll Test: no nystagmus Left Roll Test: no nystagmus but reported vertigo               VESTIBULAR - OCULAR REFLEX (01/08/23)             VOR Cancellation: Corrective Saccades            Head-Impulse Test: HIT Right: positive HIT Left: positive VOR 1 (horizontal): impaired, dizziness reported    MOTION SENSITIVITY:  Motion Sensitivity Quotient Intensity: 0 = none, 1 = Lightheaded, 2 = Mild, 3 = Moderate, 4 = Severe, 5 = Vomiting  Intensity 01/08/23 02/20/23  1. Sitting to supine  2. Supine to L side    3. Supine to R side    4. Supine to sitting    5. L Hallpike-Dix    6. Up from L     7. R Hallpike-Dix    8. Up from R     9. Sitting, head tipped to L knee    10. Head up from L knee    11. Sitting, head tipped to R knee    12. Head up from R knee    13. Sitting head turns x5    14.Sitting head nods x5    15. In stance, 180 turn to L  3 00  16. In stance, 180 turn to R 3     (Blank rows = not tested) (+) Rhomberg test (02/20/23) (-) Active VBI test on B  VESTIBULAR TREATMENT:                                                                                                   DATE: 02/20/23 Re-evaluation done (vestibular assessment, ROM, MMT, DGI, 2MWT, 5TSTS) Patient education  02/18/23 Seated Cervical flex/ext, rot AROM x 10 x 2 on each as warm-up Standing on foam, normal BOS:  Horizontal saccades by identifying numbers x 1'  Vertical saccades by identifying colors x 1'  Vertical smooth pursuits by counting shapes/colors x 1' Horizontal smooth pursuits by identifying animal silhouettes x 1' Horizontal VOR 1 by identifying colors, own pace x 1' Horizontal VOR 1 by reading colors on a colored text, own pace, x 1' VOR 2, identifying numbers, own pace x 1' VOR cancellation by identifying shapes x  1' VOR cancellation by identifying colors x 1' Rhythmic stabilization x 3" x 10 x 2, ant/post, eyes closed Tandem stance x 30" x 2 on each Static standing, eyes closed, narrow BOS x 30"  02/13/23 Standing on firm surface, normal BOS:  Cervical flex/ext, rot AROM x 10 x 2 on each as warm-up  Horizontal saccades by identifying numbers x 1'  Vertical saccades by identifying colors x 1'  Vertical smooth pursuits by counting shapes/colors x 1' Horizontal smooth pursuits by identifying animal silhouettes x 1' Horizontal VOR 1 by identifying colors, own pace x 1' Horizontal VOR 1 by reading colors on a colored text, own pace, x 1' VOR 2, identifying numbers, own pace x 1' VOR cancellation by reading words x 1' VOR cancellation by identifying colors x 1' Rhythmic stabilization x 4" x 10 x 2, ant/post, eyes closed Tandem stance x 30" x 2 on each Static standing, eyes closed, narrow BOS x 30"  02/11/23 Standing on firm surface, normal BOS:  Cervical flex/ext, rot AROM x 10 x 2 on each as warm-up  Horizontal saccades by identifying numbers x 1'  Vertical saccades by reading words x 1'  Horizontal smooth pursuits by counting shapes/colors x 1' Vertical smooth pursuits by identifying animal silhouettes x 1' Horizontal VOR 1 by identifying colors, own pace x 1' Horizontal VOR 1 by reading words, own pace, x 1' VOR cancellation by identifying numbers x 1' VOR cancellation by identifying colors x  1' Rhythmic stabilization x 4" x 10 x 2, ant/post, eyes open Tandem stance x 30" x 2 on each  02/06/23 Casani maneuver for the L horizontal SCC x 1 rep Seated:  Cervical flex/ext, rot AROM x 10 x 2 on each as warm-up  Horizontal saccades by identifying numbers x 1'  Vertical saccades by reading words x 1'  Horizontal smooth pursuits by counting shapes/colors x 1' Vertical smooth pursuits by identifying animal silhouettes x 1' Horizontal VOR 1 by identifying colors, 80 bpm metronome x 1' Horizontal  VOR 1 by reading words, 80 bpm metronome, x 1' VOR 2, identifying numbers x 1' VOR cancellation by identifying numbers x 1' VOR cancellation by identifying colors x 1'   01/22/23 Seated:           Cervical flex/ext, rot AROM x 10 x 2 on each as warm-up           Horizontal saccades by identifying numbers x 1'           Vertical saccades by reading words x 1'           Horizontal smooth pursuits by counting shapes/colors x 1' Vertical smooth pursuits by identifying animal silhouettes x 1' Horizontal VOR 1 by identifying shapes, 70 bpm metronome x 1' Horizontal VOR 1 by reading words, 70 bpm metronome, x 1' VOR cancellation by reading words x 1' VOR cancellation by identifying shapes x 1'  01/13/23 LT BBQ roll for horizontal canal per previous assessment (reports mild dizziness initially but no nystagmus)  -poor transition from supine to LT sidelying   Seated head turns x 10 (no sx) Seated head nods x10 (no sx)  Seated VOR 1 horizontal x10 (mild dizziness, difficulty tracking to LT)   01/08/23 Evaluation and patient education done  PATIENT EDUCATION: Education details: Education on the purpose of continuing PT (patient declined), Education on falls risk reduction at home, Education on continuing HEP once on a daily basis for the next 4 weeks and then tapering it to 3-5x/week the following month Person educated: Patient Education method: Explanation Education comprehension: verbalized understanding  HOME EXERCISE PROGRAM: 02/11/23 Access Code: [email or web address] URL: [email or web address] - Heel Toe Raises with Counter Support  - 2 x daily - 7 x weekly - 2 sets - 10 reps - Standing Tandem Balance with Counter Support  - 2 x daily - 7 x weekly - 2 reps - 30 hold  01/22/23 - done 2-3x/day, 5-7 days/week (written copy provided and reviewed)  Exercise 1 (Horizontal Saccades) Use two cards for this exercise While holding the cards on each hand side by side in sitting with arms  outstretched, look at the first item on the other card then look at the next item on the second card then alternate your gaze sequentially. Do NOT move your cards nor the your head. Do the following for one minute each: Identifying numbers Reading words Identifying shapes  Exercise 2 (Paradigm x1) Use only one card for this exercise While holding the card in sitting with your arm outstretched, turn your head left and right at your own pace while keeping the card stationary.  Do this for one minute each while you are: Identifying numbers Reading words Identifying shapes  Exercise 4 (VOR cancellation) Use only one card for this exercise While holding the card in sitting with your arms outstretched, turn your body, arm, and head at the same time left and right at your own pace Do this for one minute each while you  are: Identifying numbers Reading words Identifying shapes   Access Code: [email or web address] URL: [email or web address] Date: 01/13/2023 Prepared by: Josue Hector  Exercises - Seated Gaze Stabilization with Head Rotation  - 3 x daily - 7 x weekly - 3 sets - 10 reps - Seated Left Head Turns Vestibular Habituation  - 3 x daily - 7 x weekly - 3 sets - 10 reps  GOALS: Goals reviewed with patient? Yes  SHORT TERM GOALS: Target date: 02/05/23  Pt will demonstrate indep in HEP to facilitate carry-over of skilled services and improve functional outcomes  Goal status: MET  2.  Pt will demonstrate a negative L Dix-Hallpike to facilitate ease in bed mobility Baseline: positive Goal status: MET   LONG TERM GOALS: Target date: 03/05/23  Pt will improve Motion sensitivity quotient to 0 (turning in standing) in order to demonstrate clinically significant improvement in balance and decreased risk for falls Baseline: 3 Goal status: MET  2.  Pt will improve DGI by at least 3 points in order to demonstrate clinically significant improvement in balance and decreased risk for  falls  Baseline: 13 Goal status: NOT MET  ASSESSMENT:  CLINICAL IMPRESSION: Patient demonstrated continued improvements in function as indicated by positive significant changes in VOR, VOR cancellation, HIT, Dix-Hallpike, motion sensitivity, and supine roll test. However, patient still presents with deficits in balance as manifested by a low DGI score (13/24). With this, skilled PT is still required to reduce falls. However, patient declined to continue with PT saying that he already feels better when it comes to his dizziness and will just to continue with his HEP   OBJECTIVE IMPAIRMENTS: Abnormal gait, decreased balance, difficulty walking, and impaired flexibility.   ACTIVITY LIMITATIONS: standing  PARTICIPATION LIMITATIONS: community activity  PERSONAL FACTORS: Age and Fitness are also affecting patient's functional outcome.   REHAB POTENTIAL: Fair    CLINICAL DECISION MAKING: Evolving/moderate complexity  EVALUATION COMPLEXITY: Moderate   PLAN:  PT FREQUENCY:  0  PT DURATION: other: 0  PLANNED INTERVENTIONS:  None indicated this time as patient refused to continue PT to work with his balance. Patient wishes to be D/C and just do his [place]. Erian Lariviere, PT, DPT, OCS Board-Certified Clinical Specialist in [place] # ([place]): [id number] T 4:31 PM, 02/20/23

## 2023-02-25 ENCOUNTER — Encounter (HOSPITAL_COMMUNITY): Payer: Medicare PPO

## 2023-02-25 DIAGNOSIS — M76811 Anterior tibial syndrome, right leg: Secondary | ICD-10-CM | POA: Diagnosis not present

## 2023-02-25 DIAGNOSIS — M79671 Pain in right foot: Secondary | ICD-10-CM | POA: Diagnosis not present

## 2023-02-27 ENCOUNTER — Encounter (HOSPITAL_COMMUNITY): Payer: Medicare PPO

## 2023-03-03 ENCOUNTER — Encounter (HOSPITAL_COMMUNITY): Payer: Medicare PPO

## 2023-03-06 ENCOUNTER — Encounter (HOSPITAL_COMMUNITY): Payer: Medicare PPO

## 2023-03-10 ENCOUNTER — Other Ambulatory Visit: Payer: Self-pay | Admitting: Family Medicine

## 2023-03-10 DIAGNOSIS — E119 Type 2 diabetes mellitus without complications: Secondary | ICD-10-CM

## 2023-03-18 DIAGNOSIS — L03032 Cellulitis of left toe: Secondary | ICD-10-CM | POA: Diagnosis not present

## 2023-03-18 DIAGNOSIS — M79675 Pain in left toe(s): Secondary | ICD-10-CM | POA: Diagnosis not present

## 2023-03-31 ENCOUNTER — Encounter: Payer: Medicare PPO | Admitting: Psychology

## 2023-04-09 ENCOUNTER — Encounter: Payer: Medicare PPO | Admitting: Psychology

## 2023-04-14 ENCOUNTER — Other Ambulatory Visit: Payer: Self-pay | Admitting: Family Medicine

## 2023-04-22 ENCOUNTER — Other Ambulatory Visit: Payer: Self-pay | Admitting: Family Medicine

## 2023-05-06 DIAGNOSIS — H2513 Age-related nuclear cataract, bilateral: Secondary | ICD-10-CM | POA: Diagnosis not present

## 2023-05-06 DIAGNOSIS — E119 Type 2 diabetes mellitus without complications: Secondary | ICD-10-CM | POA: Diagnosis not present

## 2023-05-06 DIAGNOSIS — H52203 Unspecified astigmatism, bilateral: Secondary | ICD-10-CM | POA: Diagnosis not present

## 2023-05-06 DIAGNOSIS — H31003 Unspecified chorioretinal scars, bilateral: Secondary | ICD-10-CM | POA: Diagnosis not present

## 2023-05-06 DIAGNOSIS — H16223 Keratoconjunctivitis sicca, not specified as Sjogren's, bilateral: Secondary | ICD-10-CM | POA: Diagnosis not present

## 2023-05-06 DIAGNOSIS — H5203 Hypermetropia, bilateral: Secondary | ICD-10-CM | POA: Diagnosis not present

## 2023-05-06 LAB — HM DIABETES EYE EXAM

## 2023-05-23 ENCOUNTER — Other Ambulatory Visit: Payer: Self-pay | Admitting: Family Medicine

## 2023-05-23 DIAGNOSIS — I10 Essential (primary) hypertension: Secondary | ICD-10-CM

## 2023-05-26 NOTE — Progress Notes (Unsigned)
ACUTE VISIT No chief complaint on file.  HPI: Clifford Gibson is a 77 y.o. male, who is here today complaining of *** HPI  Review of Systems See other pertinent positives and negatives in HPI.  Current Outpatient Medications on File Prior to Visit  Medication Sig Dispense Refill   ACCU-CHEK FASTCLIX LANCETS MISC Test once daily Dx E11.9 100 each 3   Apoaequorin (PREVAGEN PO) Take 1 capsule by mouth daily.     Apple Cider Vinegar 600 MG CAPS Take 1 capsule by mouth daily.     aspirin EC 81 MG tablet Take 81 mg by mouth daily.     Cholecalciferol (VITAMIN D) 50 MCG (2000 UT) CAPS Take 1 capsule by mouth daily.     docusate sodium (COLACE) 250 MG capsule Take 250 mg by mouth in the morning and at bedtime.     donepezil (ARICEPT) 10 MG tablet TAKE ONE TABLET AT BEDTIME 30 tablet 1   Garlic 1000 MG CAPS Take 1 capsule by mouth daily.     glucose blood (ACCU-CHEK AVIVA) test strip Test once daily. E11.9 100 each 12   ipratropium (ATROVENT) 0.03 % nasal spray Place 2 sprays into both nostrils daily.     metFORMIN (GLUCOPHAGE) 500 MG tablet TAKE 2 TABLETS TWO TIMES DAILY WITH A MEAL 360 tablet 0   mirabegron ER (MYRBETRIQ) 25 MG TB24 tablet Take 1 tablet (25 mg total) by mouth daily. 30 tablet 5   pantoprazole (PROTONIX) 40 MG tablet Take 1 tablet (40 mg total) by mouth daily. 90 tablet 3   Psyllium 100 % PACK Take 1 packet by mouth daily.     REPATHA SURECLICK 140 MG/ML SOAJ INJECT 140MG  ( ) EVERY 14 DAYS 6 mL 1   valsartan-hydrochlorothiazide (DIOVAN-HCT) 160-12.5 MG tablet TAKE ONE TABLET AT BEDTIME 90 tablet 0   Zinc 25 MG TABS Take 1 tablet by mouth daily.     No current facility-administered medications on file prior to visit.    Past Medical History:  Diagnosis Date   Acute serous otitis media 02/22/2010   Arthritis    Diabetes mellitus without complication ([facility])    ERECTILE DYSFUNCTION 04/25/2009   GERD 03/24/2009   HYPERLIPIDEMIA 03/28/2010   HYPERTENSION 03/24/2009    OBESITY, MODERATE 04/25/2009   PONV (postoperative nausea and vomiting)    Allergies  Allergen Reactions   Lipitor [Atorvastatin Calcium] Other (See Comments)   Penicillins Other (See Comments)    Reaction=bleeding,diarrhea   Statins Other (See Comments)    Myalgia     Social History   Socioeconomic History   Marital status: Married    Spouse name: Ethel   Number of children: 1   Years of education: 18   Highest education level: Professional school degree (e.g., MD, DDS, DVM, JD)  Occupational History   Occupation: retired  Tobacco Use   Smoking status: Never   Smokeless tobacco: Never  Vaping Use   Vaping Use: Never used  Substance and Sexual Activity   Alcohol use: No   Drug use: No   Sexual activity: Not on file  Other Topics Concern   Not on file  Social History Narrative   Married to [place]   HH - 2   1 daughter lives next door   Grandchildren   Caffeine 1 a day   Right hand   Social Determinants of Health   Financial Resource Strain: Low Risk  (01/20/2023)   Overall Financial Resource Strain (CARDIA)    Difficulty of Paying  Living Expenses: Not hard at all  Food Insecurity: No Food Insecurity (01/20/2023)   Hunger Vital Sign    Worried About Running Out of Food in the Last Year: Never true    Ran Out of Food in the Last Year: Never true  Transportation Needs: No Transportation Needs (01/20/2023)   PRAPARE - Administrator, Civil Service (Medical): No    Lack of Transportation (Non-Medical): No  Physical Activity: Insufficiently Active (01/20/2023)   Exercise Vital Sign    Days of Exercise per Week: 3 days    Minutes of Exercise per Session: 30 min  Stress: No Stress Concern Present (01/20/2023)   [employer] of Occupational Health - Occupational Stress Questionnaire    Feeling of Stress : Not at all  Social Connections: Socially Integrated (01/20/2023)   Social Connection and Isolation Panel [NHANES]    Frequency of Communication with  Friends and Family: More than three times a week    Frequency of Social Gatherings with Friends and Family: More than three times a week    Attends Religious Services: More than 4 times per year    Active Member of [employer] or Organizations: Yes    Attends Engineer, structural: More than 4 times per year    Marital Status: Married    There were no vitals filed for this visit. There is no height or weight on file to calculate BMI.  Physical Exam  ASSESSMENT AND PLAN: There are no diagnoses linked to this encounter.  No follow-ups on file.  Vicky Mccanless G. [place], MD  [facility]. [place] office.  Discharge Instructions   None

## 2023-05-27 ENCOUNTER — Ambulatory Visit (INDEPENDENT_AMBULATORY_CARE_PROVIDER_SITE_OTHER): Payer: Medicare PPO

## 2023-05-27 ENCOUNTER — Encounter: Payer: Self-pay | Admitting: Family Medicine

## 2023-05-27 ENCOUNTER — Ambulatory Visit: Payer: Medicare PPO | Admitting: Family Medicine

## 2023-05-27 VITALS — BP 124/80 | HR 90 | Temp 98.4°F | Resp 16 | Ht 68.0 in | Wt 215.0 lb

## 2023-05-27 DIAGNOSIS — R062 Wheezing: Secondary | ICD-10-CM | POA: Diagnosis not present

## 2023-05-27 DIAGNOSIS — R059 Cough, unspecified: Secondary | ICD-10-CM

## 2023-05-27 DIAGNOSIS — J40 Bronchitis, not specified as acute or chronic: Secondary | ICD-10-CM | POA: Diagnosis not present

## 2023-05-27 DIAGNOSIS — J069 Acute upper respiratory infection, unspecified: Secondary | ICD-10-CM

## 2023-05-27 DIAGNOSIS — I700 Atherosclerosis of aorta: Secondary | ICD-10-CM | POA: Diagnosis not present

## 2023-05-27 LAB — POC COVID19 BINAXNOW: SARS Coronavirus 2 Ag: NEGATIVE

## 2023-05-27 MED ORDER — BENZONATATE 100 MG PO CAPS
100.0000 mg | ORAL_CAPSULE | Freq: Two times a day (BID) | ORAL | 0 refills | Status: AC | PRN
Start: 2023-05-27 — End: 2023-06-06

## 2023-05-27 MED ORDER — ALBUTEROL SULFATE HFA 108 (90 BASE) MCG/ACT IN AERS
2.0000 | INHALATION_SPRAY | Freq: Four times a day (QID) | RESPIRATORY_TRACT | 0 refills | Status: DC | PRN
Start: 2023-05-27 — End: 2023-06-02

## 2023-05-27 NOTE — Patient Instructions (Addendum)
A few things to remember from today's visit:  Cough, unspecified type - Plan: POC COVID-19, DG Chest 2 View, benzonatate (TESSALON) 100 MG capsule  URI, acute  Wheezing - Plan: DG Chest 2 View, albuterol (VENTOLIN HFA) 108 (90 Base) MCG/ACT inhaler Plain Mucinex. Albuterol inh 1-2 puff every 6 hours for a week then as needed for wheezing or shortness of breath.  Monitor for new symptoms.  If you need refills for medications you take chronically, please call your pharmacy. Do not use My Chart to request refills or for acute issues that need immediate attention. If you send a my chart message, it may take a few days to be addressed, specially if I am not in the office.  Please be sure medication list is accurate. If a new problem present, please set up appointment sooner than planned today.

## 2023-05-29 ENCOUNTER — Encounter: Payer: Self-pay | Admitting: Family Medicine

## 2023-06-02 ENCOUNTER — Other Ambulatory Visit: Payer: Self-pay

## 2023-06-02 DIAGNOSIS — R062 Wheezing: Secondary | ICD-10-CM

## 2023-06-02 MED ORDER — ALBUTEROL SULFATE HFA 108 (90 BASE) MCG/ACT IN AERS
2.0000 | INHALATION_SPRAY | Freq: Four times a day (QID) | RESPIRATORY_TRACT | 0 refills | Status: DC | PRN
Start: 2023-06-02 — End: 2024-06-30

## 2023-06-10 ENCOUNTER — Ambulatory Visit: Payer: Medicare PPO | Admitting: Family Medicine

## 2023-06-10 ENCOUNTER — Encounter: Payer: Self-pay | Admitting: Family Medicine

## 2023-06-10 VITALS — BP 140/86 | HR 94 | Temp 98.3°F | Ht 68.0 in | Wt 211.2 lb

## 2023-06-10 DIAGNOSIS — R600 Localized edema: Secondary | ICD-10-CM | POA: Diagnosis not present

## 2023-06-10 DIAGNOSIS — H16223 Keratoconjunctivitis sicca, not specified as Sjogren's, bilateral: Secondary | ICD-10-CM | POA: Diagnosis not present

## 2023-06-10 DIAGNOSIS — E785 Hyperlipidemia, unspecified: Secondary | ICD-10-CM

## 2023-06-10 DIAGNOSIS — E119 Type 2 diabetes mellitus without complications: Secondary | ICD-10-CM | POA: Diagnosis not present

## 2023-06-10 DIAGNOSIS — Z7984 Long term (current) use of oral hypoglycemic drugs: Secondary | ICD-10-CM | POA: Diagnosis not present

## 2023-06-10 DIAGNOSIS — I10 Essential (primary) hypertension: Secondary | ICD-10-CM

## 2023-06-10 LAB — COMPREHENSIVE METABOLIC PANEL
ALT: 11 U/L (ref 0–53)
AST: 13 U/L (ref 0–37)
Albumin: 4.3 g/dL (ref 3.5–5.2)
Alkaline Phosphatase: 53 U/L (ref 39–117)
BUN: 17 mg/dL (ref 6–23)
CO2: 29 mEq/L (ref 19–32)
Calcium: 9.2 mg/dL (ref 8.4–10.5)
Chloride: 100 mEq/L (ref 96–112)
Creatinine, Ser: 1.32 mg/dL (ref 0.40–1.50)
GFR: 51.67 mL/min — ABNORMAL LOW (ref >60.00)
Glucose, Bld: 112 mg/dL — ABNORMAL HIGH (ref 70–99)
Potassium: 4 mEq/L (ref 3.5–5.1)
Sodium: 141 mEq/L (ref 135–145)
Total Bilirubin: 0.5 mg/dL (ref 0.2–1.2)
Total Protein: 6.8 g/dL (ref 6.0–8.3)

## 2023-06-10 LAB — BRAIN NATRIURETIC PEPTIDE: Pro B Natriuretic peptide (BNP): 18 pg/mL (ref 0.0–100.0)

## 2023-06-10 LAB — LIPID PANEL
Cholesterol: 119 mg/dL (ref 0–200)
HDL: 46.9 mg/dL (ref >39.00)
LDL Cholesterol: 45 mg/dL (ref 0–99)
NonHDL: 71.98
Total CHOL/HDL Ratio: 3
Triglycerides: 133 mg/dL (ref 0.0–149.0)
VLDL: 26.6 mg/dL (ref 0.0–40.0)

## 2023-06-10 LAB — HEMOGLOBIN A1C: Hgb A1c MFr Bld: 6.3 % (ref 4.6–6.5)

## 2023-06-10 LAB — MICROALBUMIN / CREATININE URINE RATIO
Creatinine,U: 140.2 mg/dL
Microalb Creat Ratio: 0.9 mg/g (ref 0.0–30.0)
Microalb, Ur: 1.3 mg/dL (ref 0.0–1.9)

## 2023-06-10 LAB — TSH: TSH: 1 u[IU]/mL (ref 0.35–5.50)

## 2023-06-10 NOTE — Progress Notes (Signed)
Established Patient Office Visit  Subjective   Patient ID: Clifford Gibson, male    DOB: 1946-02-22  Age: 77 y.o. MRN: [id number]  Chief Complaint  Patient presents with   Medical Management of Chronic Issues    HPI   Labib has history of hypertension, type 2 diabetes, hyperlipidemia, obesity, mild cognitive impairment.  Seen today for the following issues  Recent acute bronchial illness.  He was seen here June 25 with cough felt to be viral.  Chest x-ray showed "mild bronchitic changes ".  No pneumonia.  Only took 1 dose of Tessalon but had questionable side effects.  Still has occasional productive cough now.  No fever.  No dyspnea.  Complaining of some bilateral leg and ankle edema and foot edema.  Symptoms are worse late in the day.  No orthopnea.  No dyspnea.  No known history of obstructive sleep apnea.  No recent change in medication.  He takes Diovan  for hypertension.  No calcium channel blocker use.  Tries to watch sodium intake.  Previous echocardiogram 2013 normal EF with grade 2 diastolic dysfunction.  Past Medical History:  Diagnosis Date   Acute serous otitis media 02/22/2010   Arthritis    Diabetes mellitus without complication (HCC)    ERECTILE DYSFUNCTION 04/25/2009   GERD 03/24/2009   HYPERLIPIDEMIA 03/28/2010   HYPERTENSION 03/24/2009   OBESITY, MODERATE 04/25/2009   PONV (postoperative nausea and vomiting)    Past Surgical History:  Procedure Laterality Date   COLONOSCOPY N/A 06/21/2015   Procedure: COLONOSCOPY;  Surgeon: Malissa Hippo, MD;  Location: [facility];  Service: Endoscopy;  Laterality: N/A;  930 - moved to 7/20 @ 7:30   NASAL SEPTUM SURGERY  1974   TONSILLECTOMY     3 yrs. old   TOTAL KNEE ARTHROPLASTY  06/11/2012   Procedure: TOTAL KNEE ARTHROPLASTY;  Surgeon: Javier Docker, MD;  Location: [facility];  Service: Orthopedics;  Laterality: Right;    reports that he has never smoked. He has never used smokeless tobacco. He reports that he does  not drink alcohol and does not use drugs. family history includes Dementia in his father; Scleroderma in his mother. Allergies  Allergen Reactions   Lipitor [Atorvastatin Calcium] Other (See Comments)   Penicillins Other (See Comments)    Reaction=bleeding,diarrhea   Statins Other (See Comments)    Myalgia     Review of Systems  Constitutional:  Negative for chills, fever and malaise/fatigue.  Eyes:  Negative for blurred vision.  Respiratory:  Positive for cough. Negative for hemoptysis, shortness of breath and wheezing.   Cardiovascular:  Positive for leg swelling. Negative for chest pain, orthopnea and PND.  Neurological:  Negative for dizziness, weakness and headaches.      Objective:     BP (!) 140/86 (BP Location: Left Arm, Patient Position: Sitting, Cuff Size: Normal)   Pulse 94   Temp 98.3 F (36.8 C) (Oral)   Ht 5\' 8"  (1.727 m)   Wt 211 lb 3.2 oz (95.8 kg)   SpO2 95%   BMI 32.11 kg/m  BP Readings from Last 3 Encounters:  06/10/23 (!) 140/86  05/27/23 124/80  12/06/22 120/72   Wt Readings from Last 3 Encounters:  06/10/23 211 lb 3.2 oz (95.8 kg)  05/27/23 215 lb (97.5 kg)  01/20/23 215 lb (97.5 kg)      Physical Exam Vitals reviewed.  Constitutional:      General: He is not in acute distress.    Appearance:  Normal appearance.  HENT:     Mouth/Throat:     Mouth: Mucous membranes are moist.     Pharynx: Oropharynx is clear.  Cardiovascular:     Rate and Rhythm: Normal rate and regular rhythm.  Pulmonary:     Effort: Pulmonary effort is normal.     Breath sounds: Normal breath sounds. No wheezing or rales.  Musculoskeletal:     Cervical back: Neck supple.     Right lower leg: Edema present.     Left lower leg: Edema present.     Comments: He has 1+ pitting edema feet ankles lower legs bilaterally  Lymphadenopathy:     Cervical: No cervical adenopathy.  Neurological:     Mental Status: He is alert.      No results found for any visits on  06/10/23.  Last CBC Lab Results  Component Value Date   WBC 9.3 04/25/2020   HGB 14.0 04/25/2020   HCT 41.0 04/25/2020   MCV 87.0 04/25/2020   MCH 28.5 06/14/2012   RDW 14.9 04/25/2020   PLT 290.0 04/25/2020   Last metabolic panel Lab Results  Component Value Date   GLUCOSE 88 04/16/2022   NA 139 04/16/2022   K 4.4 04/16/2022   CL 100 04/16/2022   CO2 28 04/16/2022   BUN 28 (H) 04/16/2022   CREATININE 1.41 04/16/2022   GFR 48.13 (L) 04/16/2022   CALCIUM 10.0 04/16/2022   PROT 7.1 04/16/2022   ALBUMIN 4.9 04/16/2022   BILITOT 0.6 04/16/2022   ALKPHOS 49 04/16/2022   AST 13 04/16/2022   ALT 15 04/16/2022   Last lipids Lab Results  Component Value Date   CHOL 129 07/11/2022   HDL 54.60 07/11/2022   LDLCALC 40 07/11/2022   LDLDIRECT 172.0 04/16/2022   TRIG 171.0 (H) 07/11/2022   CHOLHDL 2 07/11/2022   Last hemoglobin A1c Lab Results  Component Value Date   HGBA1C 6.0 (A) 12/06/2022   Last thyroid functions Lab Results  Component Value Date   TSH 1.24 04/16/2022      The ASCVD Risk score (Arnett DK, et al., 2019) failed to calculate for the following reasons:   The valid total cholesterol range is 130 to 320 mg/dL    Assessment & Plan:   #1 recent acute bronchitis.  Suspect viral.  Nonfocal exam.  Gradually improving.  Continue observation for now.  Follow-up immediately for any fever or progressive cough  #2 bilateral ankle and foot edema.  Suspect probably venous stasis and diastolic dysfunction.  Nonfocal lung exam. -Check CMP, TSH, BNP, microalbumin creatinine ratio -Elevate legs frequently -Consider knee-high compression -We did discuss possible short-term use of loop diuretic but they would like to avoid at this point if possible  #3 hypertension.  Slightly up today.  Watch sodium intake.  Monitor closely at home and be in touch if consistently greater than 140 systolic  #4 type 2 diabetes treated with metformin.  History of good control.   Recheck A1c and urine microalbumin screen  #5 hyperlipidemia.  Intolerance with multiple statins previously.  Tolerating Repatha.  Recheck lipids  No follow-ups on file.    Evelena Peat, MD

## 2023-06-16 DIAGNOSIS — M9903 Segmental and somatic dysfunction of lumbar region: Secondary | ICD-10-CM | POA: Diagnosis not present

## 2023-06-16 DIAGNOSIS — M5136 Other intervertebral disc degeneration, lumbar region: Secondary | ICD-10-CM | POA: Diagnosis not present

## 2023-06-18 DIAGNOSIS — M5136 Other intervertebral disc degeneration, lumbar region: Secondary | ICD-10-CM | POA: Diagnosis not present

## 2023-06-18 DIAGNOSIS — M9903 Segmental and somatic dysfunction of lumbar region: Secondary | ICD-10-CM | POA: Diagnosis not present

## 2023-06-19 DIAGNOSIS — M5136 Other intervertebral disc degeneration, lumbar region: Secondary | ICD-10-CM | POA: Diagnosis not present

## 2023-06-19 DIAGNOSIS — M9903 Segmental and somatic dysfunction of lumbar region: Secondary | ICD-10-CM | POA: Diagnosis not present

## 2023-06-23 ENCOUNTER — Other Ambulatory Visit: Payer: Self-pay | Admitting: Family Medicine

## 2023-06-23 DIAGNOSIS — E119 Type 2 diabetes mellitus without complications: Secondary | ICD-10-CM

## 2023-06-23 DIAGNOSIS — M9903 Segmental and somatic dysfunction of lumbar region: Secondary | ICD-10-CM | POA: Diagnosis not present

## 2023-06-23 DIAGNOSIS — M5136 Other intervertebral disc degeneration, lumbar region: Secondary | ICD-10-CM | POA: Diagnosis not present

## 2023-06-25 DIAGNOSIS — M9903 Segmental and somatic dysfunction of lumbar region: Secondary | ICD-10-CM | POA: Diagnosis not present

## 2023-06-25 DIAGNOSIS — M5136 Other intervertebral disc degeneration, lumbar region: Secondary | ICD-10-CM | POA: Diagnosis not present

## 2023-06-26 DIAGNOSIS — M5136 Other intervertebral disc degeneration, lumbar region: Secondary | ICD-10-CM | POA: Diagnosis not present

## 2023-06-26 DIAGNOSIS — M9903 Segmental and somatic dysfunction of lumbar region: Secondary | ICD-10-CM | POA: Diagnosis not present

## 2023-07-01 DIAGNOSIS — M5136 Other intervertebral disc degeneration, lumbar region: Secondary | ICD-10-CM | POA: Diagnosis not present

## 2023-07-01 DIAGNOSIS — M9903 Segmental and somatic dysfunction of lumbar region: Secondary | ICD-10-CM | POA: Diagnosis not present

## 2023-07-07 DIAGNOSIS — M5136 Other intervertebral disc degeneration, lumbar region: Secondary | ICD-10-CM | POA: Diagnosis not present

## 2023-07-07 DIAGNOSIS — M9903 Segmental and somatic dysfunction of lumbar region: Secondary | ICD-10-CM | POA: Diagnosis not present

## 2023-07-14 DIAGNOSIS — M9903 Segmental and somatic dysfunction of lumbar region: Secondary | ICD-10-CM | POA: Diagnosis not present

## 2023-07-14 DIAGNOSIS — M5136 Other intervertebral disc degeneration, lumbar region: Secondary | ICD-10-CM | POA: Diagnosis not present

## 2023-07-15 DIAGNOSIS — M9903 Segmental and somatic dysfunction of lumbar region: Secondary | ICD-10-CM | POA: Diagnosis not present

## 2023-07-15 DIAGNOSIS — M5136 Other intervertebral disc degeneration, lumbar region: Secondary | ICD-10-CM | POA: Diagnosis not present

## 2023-07-17 DIAGNOSIS — M9903 Segmental and somatic dysfunction of lumbar region: Secondary | ICD-10-CM | POA: Diagnosis not present

## 2023-07-17 DIAGNOSIS — M5136 Other intervertebral disc degeneration, lumbar region: Secondary | ICD-10-CM | POA: Diagnosis not present

## 2023-07-21 DIAGNOSIS — M9903 Segmental and somatic dysfunction of lumbar region: Secondary | ICD-10-CM | POA: Diagnosis not present

## 2023-07-21 DIAGNOSIS — M5136 Other intervertebral disc degeneration, lumbar region: Secondary | ICD-10-CM | POA: Diagnosis not present

## 2023-07-22 DIAGNOSIS — M5136 Other intervertebral disc degeneration, lumbar region: Secondary | ICD-10-CM | POA: Diagnosis not present

## 2023-07-22 DIAGNOSIS — M9903 Segmental and somatic dysfunction of lumbar region: Secondary | ICD-10-CM | POA: Diagnosis not present

## 2023-07-24 ENCOUNTER — Other Ambulatory Visit: Payer: Self-pay | Admitting: Family Medicine

## 2023-09-10 ENCOUNTER — Encounter: Payer: Medicare PPO | Admitting: Psychology

## 2023-09-10 ENCOUNTER — Ambulatory Visit: Payer: Self-pay

## 2023-09-10 ENCOUNTER — Institutional Professional Consult (permissible substitution): Payer: Medicare PPO | Admitting: Psychology

## 2023-09-16 ENCOUNTER — Other Ambulatory Visit: Payer: Self-pay | Admitting: Family Medicine

## 2023-09-16 DIAGNOSIS — I10 Essential (primary) hypertension: Secondary | ICD-10-CM

## 2023-09-17 ENCOUNTER — Encounter: Payer: Medicare PPO | Admitting: Psychology

## 2023-09-22 ENCOUNTER — Other Ambulatory Visit: Payer: Self-pay | Admitting: Family Medicine

## 2023-09-25 ENCOUNTER — Ambulatory Visit: Payer: Medicare PPO | Admitting: Physician Assistant

## 2023-10-02 ENCOUNTER — Other Ambulatory Visit: Payer: Self-pay | Admitting: Family Medicine

## 2023-10-02 DIAGNOSIS — E119 Type 2 diabetes mellitus without complications: Secondary | ICD-10-CM

## 2023-10-14 ENCOUNTER — Other Ambulatory Visit: Payer: Self-pay | Admitting: Family Medicine

## 2023-11-11 ENCOUNTER — Encounter: Payer: Self-pay | Admitting: Family Medicine

## 2023-11-11 MED ORDER — MIRABEGRON ER 50 MG PO TB24: 50.0000 mg | ORAL_TABLET | Freq: Every day | ORAL | 3 refills | Status: DC

## 2023-12-11 ENCOUNTER — Other Ambulatory Visit: Payer: Self-pay | Admitting: Family Medicine

## 2023-12-11 DIAGNOSIS — I10 Essential (primary) hypertension: Secondary | ICD-10-CM

## 2023-12-29 ENCOUNTER — Other Ambulatory Visit: Payer: Self-pay | Admitting: Family Medicine

## 2023-12-29 DIAGNOSIS — E119 Type 2 diabetes mellitus without complications: Secondary | ICD-10-CM

## 2024-01-05 ENCOUNTER — Ambulatory Visit: Payer: Self-pay | Admitting: Family Medicine

## 2024-01-05 ENCOUNTER — Ambulatory Visit: Payer: Medicare PPO | Admitting: Family Medicine

## 2024-01-05 ENCOUNTER — Telehealth: Payer: Medicare PPO

## 2024-01-05 VITALS — BP 140/78 | HR 102 | Temp 98.1°F | Wt 215.9 lb

## 2024-01-05 DIAGNOSIS — E785 Hyperlipidemia, unspecified: Secondary | ICD-10-CM

## 2024-01-05 DIAGNOSIS — E119 Type 2 diabetes mellitus without complications: Secondary | ICD-10-CM

## 2024-01-05 DIAGNOSIS — R059 Cough, unspecified: Secondary | ICD-10-CM | POA: Diagnosis not present

## 2024-01-05 DIAGNOSIS — J101 Influenza due to other identified influenza virus with other respiratory manifestations: Secondary | ICD-10-CM

## 2024-01-05 DIAGNOSIS — Z7984 Long term (current) use of oral hypoglycemic drugs: Secondary | ICD-10-CM | POA: Diagnosis not present

## 2024-01-05 DIAGNOSIS — I10 Essential (primary) hypertension: Secondary | ICD-10-CM | POA: Diagnosis not present

## 2024-01-05 LAB — POCT GLYCOSYLATED HEMOGLOBIN (HGB A1C): Hemoglobin A1C: 6.2 % — AB (ref 4.0–5.6)

## 2024-01-05 LAB — POCT INFLUENZA A/B
Influenza A, POC: POSITIVE — AB
Influenza B, POC: NEGATIVE

## 2024-01-05 LAB — POC COVID19 BINAXNOW: SARS Coronavirus 2 Ag: NEGATIVE

## 2024-01-05 MED ORDER — OSELTAMIVIR PHOSPHATE 75 MG PO CAPS: 75.0000 mg | ORAL_CAPSULE | Freq: Two times a day (BID) | ORAL | 0 refills | Status: DC

## 2024-01-05 NOTE — Telephone Encounter (Signed)
Agree with evaluation today, especially given his age and comorbidities  Clifford Covey MD [facility] at [facility]

## 2024-01-05 NOTE — Telephone Encounter (Signed)
  Chief Complaint: Flu + Symptoms: Sore throat, dry cough, muscle aches, fever, headache Frequency: Constant Pertinent Negatives: Patient denies N/V, SOB, diarrhea Disposition: [] ED /[] Urgent Care (no appt availability in office) / [x] Appointment(In office/virtual)/ []  [place] [facility]/ [] Home Care/ [] Refused Recommended Disposition /[] [place] [facility]/ []  Follow-up with PCP Additional Notes: Patient called with complaints of flu symptoms that started Friday. Patient states that his grandson has the flu and he tested positive for Influenza A yesterday. Symptoms include muscle aches, fever, headache, sore throat, and dry cough. Patient denies taking medication for fever and reports fevers of 100.4 yesterday and today "a little over 100F." Patient advised by this RN to be seen within 3 days per protocol, to which patient was agreeable. VV appt scheduled 01/05/24. Patient advised by this RN to call back with worsening symptoms. Patient verbalized understanding   Copied from CRM [id number]. Topic: Clinical - Red Word Triage >> Jan 05, 2024  8:56 AM Adele Barthel wrote: Red Word that prompted transfer to Nurse Triage: Patient tested positive for flu yesterday. Is having symptoms of aches, fever (100.4), runny nose, dry cough, achy in body, mild chills. Reason for Disposition  Fever present > 3 days (72 hours)  Answer Assessment - Initial Assessment Questions 1. WORST SYMPTOM: "What is your worst symptom?" (e.g., cough, runny nose, muscle aches, headache, sore throat, fever)      "Aching, I dont want it go to my chest because my wife has kidney transplant." 2. ONSET: "When did your flu symptoms start?"      "Tested positive yesterday for the Fly [A]. Symptoms started Friday maybe." 3. COUGH: "How bad is the cough?"       "Dry cough, usually from the neck up, and it's not a continuous." 4. RESPIRATORY DISTRESS: "Describe your breathing."      No trouble breathing 5. FEVER: "Do you have a fever?"  If Yes, ask: "What is your temperature, how was it measured, and when did it start?"     100.61F yesterday, today "Just a little over 100." 6. EXPOSURE: "Were you exposed to someone with influenza?"       "My grandson has the flu." 7. FLU VACCINE: "Did you get a flu shot this year?"     "October 2024" 8. HIGH RISK DISEASE: "Do you have any chronic medical problems?" (e.g., heart or lung disease, asthma, weak immune system, or other HIGH RISK conditions)     Denies 10. OTHER SYMPTOMS: "Do you have any other symptoms?"  (e.g., runny nose, muscle aches, headache, sore throat)      Aching, Burning and watery eyes, "sore throat little bit yesterday morning," headache.  Protocols used: Influenza - [facility]

## 2024-01-05 NOTE — Progress Notes (Signed)
Established Patient Office Visit  Subjective   Patient ID: Clifford Gibson, male    DOB: 27-Dec-1945  Age: 78 y.o. MRN: [id number]  Chief Complaint  Patient presents with   Cough   Generalized Body Aches   Nasal Congestion    HPI   Clifford Gibson was scheduled today for routine medical follow-up.  He also has acute illness which started Saturday late.  He states his 17 year old grandson was diagnosed just recently with influenza A.  Clifford Gibson developed sore throat on Saturday along with low-grade temperature of 100.4 and some cough and bodyaches.  No nausea, vomiting, or diarrhea.  Has had some ongoing malaise and bodyaches since then.  No significant dyspnea.  Type 2 diabetes.  Currently treated with metformin.  Last A1c 6.0%.  Not monitoring regularly.  He has hypertension treated with Diovan HCTZ.  Blood pressures generally well-controlled.  He takes Repatha for hyperlipidemia and total cholesterol 119 last July with LDL 45.  Past Medical History:  Diagnosis Date   Acute serous otitis media 02/22/2010   Arthritis    Diabetes mellitus without complication (HCC)    ERECTILE DYSFUNCTION 04/25/2009   GERD 03/24/2009   HYPERLIPIDEMIA 03/28/2010   HYPERTENSION 03/24/2009   OBESITY, MODERATE 04/25/2009   PONV (postoperative nausea and vomiting)    Past Surgical History:  Procedure Laterality Date   COLONOSCOPY N/A 06/21/2015   Procedure: COLONOSCOPY;  Surgeon: Malissa Hippo, MD;  Location: [facility];  Service: Endoscopy;  Laterality: N/A;  930 - moved to 7/20 @ 7:30   NASAL SEPTUM SURGERY  1974   TONSILLECTOMY     3 yrs. old   TOTAL KNEE ARTHROPLASTY  06/11/2012   Procedure: TOTAL KNEE ARTHROPLASTY;  Surgeon: Javier Docker, MD;  Location: [facility];  Service: Orthopedics;  Laterality: Right;    reports that he has never smoked. He has never used smokeless tobacco. He reports that he does not drink alcohol and does not use drugs. family history includes Dementia in his father; Scleroderma  in his mother. Allergies  Allergen Reactions   Lipitor [Atorvastatin Calcium] Other (See Comments)   Penicillins Other (See Comments)    Reaction=bleeding,diarrhea   Statins Other (See Comments)    Myalgia     Review of Systems  Constitutional:  Positive for malaise/fatigue. Negative for fever.  HENT:  Positive for sore throat.   Respiratory:  Positive for cough.   Cardiovascular:  Negative for chest pain.  Gastrointestinal:  Negative for abdominal pain.  Genitourinary:  Negative for dysuria.      Objective:     BP (!) 140/78 (BP Location: Left Arm, Patient Position: Sitting, Cuff Size: Normal)   Pulse (!) 102   Temp 98.1 F (36.7 C) (Oral)   Wt 215 lb 14.4 oz (97.9 kg)   SpO2 98%   BMI 32.83 kg/m  BP Readings from Last 3 Encounters:  01/05/24 (!) 140/78  06/11/23 (!) 140/86  05/27/23 124/80   Wt Readings from Last 3 Encounters:  01/05/24 215 lb 14.4 oz (97.9 kg)  06/10/23 211 lb 3.2 oz (95.8 kg)  05/27/23 215 lb (97.5 kg)      Physical Exam Vitals reviewed.  Constitutional:      General: He is not in acute distress. HENT:     Right Ear: Tympanic membrane normal.     Left Ear: Tympanic membrane normal.  Cardiovascular:     Rate and Rhythm: Normal rate and regular rhythm.  Pulmonary:     Effort: Pulmonary effort  is normal.     Breath sounds: No wheezing or rales.  Musculoskeletal:     Cervical back: Neck supple.  Neurological:     Mental Status: He is alert.      Results for orders placed or performed in visit on 01/05/24  POC COVID-19  Result Value Ref Range   SARS Coronavirus 2 Ag Negative Negative  POC Influenza A/B  Result Value Ref Range   Influenza A, POC Positive (A) Negative   Influenza B, POC Negative Negative  POC HgB A1c  Result Value Ref Range   Hemoglobin A1C 6.2 (A) 4.0 - 5.6 %   HbA1c POC (<> result, manual entry)     HbA1c, POC (prediabetic range)     HbA1c, POC (controlled diabetic range)        The ASCVD Risk score  (Arnett DK, et al., 2019) failed to calculate for the following reasons:   The valid total cholesterol range is 130 to 320 mg/dL    Assessment & Plan:   #1 influenza A.  COVID testing negative.  Patient had onset of symptoms less than 48 hours ago.  Start Tamiflu 75 mg twice daily for 5 days.  Plenty fluids and rest.  Follow-up for any persistent or worsening symptoms  #2 type 2 diabetes controlled with A1c 6.2%.  Continue metformin. Routine follow-up in about 6 months to reassess  #3 hypertension.  Slightly up today.  Generally well-controlled on valsartan HCTZ.  Recommend close monitoring  #4 hyperlipidemia treated with Repatha every 2 weeks.  Recheck fasting lipids at follow-up   Return in about 6 months (around 07/04/2024).    Evelena Peat, MD

## 2024-01-06 ENCOUNTER — Ambulatory Visit: Payer: Medicare PPO | Admitting: Family Medicine

## 2024-01-30 ENCOUNTER — Ambulatory Visit: Payer: Medicare PPO

## 2024-01-30 VITALS — Ht 68.0 in | Wt 215.0 lb

## 2024-01-30 DIAGNOSIS — Z0000 Encounter for general adult medical examination without abnormal findings: Secondary | ICD-10-CM | POA: Diagnosis not present

## 2024-01-30 NOTE — Patient Instructions (Addendum)
 Clifford Gibson , Thank you for taking time to come for your Medicare Wellness Visit. I appreciate your ongoing commitment to your health goals. Please review the following plan we discussed and let me know if I can assist you in the future.   Referrals/Orders/Follow-Ups/Clinician Recommendations:   This is a list of the screening recommended for you and due dates:  Health Maintenance  Topic Date Due   Complete foot exam   02/19/2022   COVID-19 Vaccine (2 - 2024-25 season) 08/03/2023   Eye exam for diabetics  05/05/2024   Yearly kidney function blood test for diabetes  06/09/2024   Yearly kidney health urinalysis for diabetes  06/09/2024   Hemoglobin A1C  07/04/2024   Medicare Annual Wellness Visit  01/29/2025   DTaP/Tdap/Td vaccine (3 - Td or Tdap) 12/01/2028   Pneumonia Vaccine  Completed   Flu Shot  Completed   Hepatitis C Screening  Completed   Zoster (Shingles) Vaccine  Completed   HPV Vaccine  Aged Out   Colon Cancer Screening  Discontinued    Advanced directives: (Copy Requested) Please bring a copy of your health care power of attorney and living will to the office to be added to your chart at your convenience.  Next Medicare Annual Wellness Visit scheduled for next year: Yes

## 2024-01-30 NOTE — Progress Notes (Signed)
 Subjective:   ESPIRIDION SUPINSKI is a 78 y.o. male who presents for Medicare Annual/Subsequent preventive examination.  Visit Complete: Virtual I connected with  Curt Jews on 01/30/24 by a audio enabled telemedicine application and verified that I am speaking with the correct person using two identifiers.  Patient Location: Home  Provider Location: Home Office  I discussed the limitations of evaluation and management by telemedicine. The patient expressed understanding and agreed to proceed.  Vital Signs: Because this visit was a virtual/telehealth visit, some criteria may be missing or patient reported. Any vitals not documented were not able to be obtained and vitals that have been documented are patient reported.      Objective:    Today's Vitals   01/30/24 1428  Weight: 215 lb (97.5 kg)  Height: 5\' 8"  (1.727 m)   Body mass index is 32.69 kg/m.     01/30/2024    2:38 PM 01/20/2023    2:28 PM 12/16/2022    2:15 PM 09/16/2022    3:38 PM 06/14/2022    7:56 AM 05/07/2022    7:40 AM 01/25/2022    2:46 PM  Advanced Directives  Does Patient Have a Medical Advance Directive? Yes Yes Yes Yes Yes Yes Yes  Type of Estate agent of [place];Living will Healthcare Power of [place];Living will  Healthcare Power of [place];Living will   Healthcare Power of [place];Living will  Does patient want to make changes to medical advance directive?       No - Patient declined  Copy of Healthcare Power of Attorney in Chart? No - copy requested No - copy requested     No - copy requested    Current Medications (verified) Outpatient Encounter Medications as of 01/30/2024  Medication Sig   Apple Cider Vinegar 600 MG CAPS Take 1 capsule by mouth daily.   ACCU-CHEK FASTCLIX LANCETS MISC Test once daily Dx E11.9   albuterol (VENTOLIN HFA) 108 (90 Base) MCG/ACT inhaler Inhale 2 puffs into the lungs every 6 (six) hours as needed for wheezing or shortness of breath.    Apoaequorin (PREVAGEN PO) Take 1 capsule by mouth daily.   aspirin EC 81 MG tablet Take 81 mg by mouth daily. (Patient not taking: Reported on 01/30/2024)   Cholecalciferol (VITAMIN D) 50 MCG (2000 UT) CAPS Take 1 capsule by mouth daily.   docusate sodium (COLACE) 250 MG capsule Take 250 mg by mouth in the morning and at bedtime.   donepezil (ARICEPT) 10 MG tablet TAKE ONE TABLET AT BEDTIME   Garlic 1000 MG CAPS Take 1 capsule by mouth daily.   glucose blood (ACCU-CHEK AVIVA) test strip Test once daily. E11.9   metFORMIN (GLUCOPHAGE) 500 MG tablet TAKE 2 TABLETS TWO TIMES DAILY WITH A MEAL   mirabegron ER (MYRBETRIQ) 50 MG TB24 tablet Take 1 tablet (50 mg total) by mouth daily.   oseltamivir (TAMIFLU) 75 MG capsule Take 1 capsule (75 mg total) by mouth 2 (two) times daily. (Patient not taking: Reported on 01/30/2024)   pantoprazole (PROTONIX) 40 MG tablet TAKE ONE TABLET BY MOUTH DAILY   Psyllium 100 % PACK Take 1 packet by mouth daily.   REPATHA SURECLICK 140 MG/ML SOAJ INJECT 140MG  ( ) EVERY 14 DAYS   valsartan-hydrochlorothiazide (DIOVAN-HCT) 160-12.5 MG tablet TAKE ONE TABLET AT BEDTIME   Zinc 25 MG TABS Take 1 tablet by mouth daily.   No facility-administered encounter medications on file as of 01/30/2024.    Allergies (verified) Lipitor [atorvastatin calcium], Penicillins,  and Statins   History: Past Medical History:  Diagnosis Date   Acute serous otitis media 02/22/2010   Arthritis    Diabetes mellitus without complication (HCC)    ERECTILE DYSFUNCTION 04/25/2009   GERD 03/24/2009   HYPERLIPIDEMIA 03/28/2010   HYPERTENSION 03/24/2009   OBESITY, MODERATE 04/25/2009   PONV (postoperative nausea and vomiting)    Past Surgical History:  Procedure Laterality Date   COLONOSCOPY N/A 06/21/2015   Procedure: COLONOSCOPY;  Surgeon: Malissa Hippo, MD;  [facility];  Service: Endoscopy;  Laterality: N/A;  930 - moved to 7/20 @ 7:30   NASAL SEPTUM SURGERY  1974   TONSILLECTOMY      3 yrs. old   TOTAL KNEE ARTHROPLASTY  06/11/2012   Procedure: TOTAL KNEE ARTHROPLASTY;  Surgeon: Javier Docker, MD;  Location: [facility];  Service: Orthopedics;  Laterality: Right;   Family History  Problem Relation Age of Onset   Scleroderma Mother    Dementia Father    Social History   Socioeconomic History   Marital status: Married    Spouse name: Ethel   Number of children: 1   Years of education: 18   Highest education level: Professional school degree (e.g., MD, DDS, DVM, JD)  Occupational History   Occupation: retired  Tobacco Use   Smoking status: Never   Smokeless tobacco: Never  Vaping Use   Vaping status: Never Used  Substance and Sexual Activity   Alcohol use: No   Drug use: No   Sexual activity: Not on file  Other Topics Concern   Not on file  Social History Narrative   Married to [place]   HH - 2   1 daughter lives next door   Grandchildren   Caffeine 1 a day   Right hand   Social Drivers of Health   Financial Resource Strain: Low Risk  (01/30/2024)   Overall Financial Resource Strain (CARDIA)    Difficulty of Paying Living Expenses: Not hard at all  Food Insecurity: No Food Insecurity (01/30/2024)   Hunger Vital Sign    Worried About Running Out of Food in the Last Year: Never true    Ran Out of Food in the Last Year: Never true  Transportation Needs: No Transportation Needs (01/30/2024)   PRAPARE - Administrator, Civil Service (Medical): No    Lack of Transportation (Non-Medical): No  Physical Activity: Insufficiently Active (01/30/2024)   Exercise Vital Sign    Days of Exercise per Week: 4 days    Minutes of Exercise per Session: 20 min  Stress: No Stress Concern Present (01/30/2024)   [employer] of Occupational Health - Occupational Stress Questionnaire    Feeling of Stress : Not at all  Social Connections: Socially Integrated (01/30/2024)   Social Connection and Isolation Panel [NHANES]    Frequency of Communication with  Friends and Family: More than three times a week    Frequency of Social Gatherings with Friends and Family: More than three times a week    Attends Religious Services: More than 4 times per year    Active Member of [employer] or Organizations: Yes    Attends Engineer, structural: More than 4 times per year    Marital Status: Married    Tobacco Counseling Counseling given: Not Answered   Clinical Intake:  Pre-visit preparation completed: Yes  Pain : No/denies pain     BMI - recorded: 32.69 Nutritional Status: BMI > 30  Obese Nutritional Risks: None  Diabetes: Yes CBG done?: No Did pt. bring in CBG monitor from home?: No  How often do you need to have someone help you when you read instructions, pamphlets, or other written materials from your doctor or pharmacy?: 1 - Never  Interpreter Needed?: No  Information entered by :: Theresa Mulligan LPN   Activities of Daily Living    01/30/2024    2:37 PM  In your present state of health, do you have any difficulty performing the following activities:  Hearing? 0  Vision? 0  Difficulty concentrating or making decisions? 0  Walking or climbing stairs? 0  Dressing or bathing? 0  Doing errands, shopping? 0  Preparing Food and eating ? N  Using the Toilet? N  In the past six months, have you accidently leaked urine? N  Do you have problems with loss of bowel control? N  Managing your Medications? N  Managing your Finances? N  Housekeeping or managing your Housekeeping? N    Patient Care Team: Kristian Covey, MD as PCP - General Reinaldo Raddle, Encarnacion Slates, [facility] (Inactive) as Pharmacist (Pharmacist)  Indicate any recent Medical Services you may have received from other than [facility] providers in the past year (date may be approximate).     Assessment:   This is a routine wellness examination for Clifford Gibson.  Hearing/Vision screen Hearing Screening - Comments:: Denies hearing difficulties   Vision Screening - Comments:: Wears rx  glasses - up to date with routine eye exams with  [facility]   Goals Addressed               This Visit's Progress     Remain Active (pt-stated)         Depression Screen    01/30/2024    2:42 PM 05/27/2023    9:53 AM 01/20/2023    2:25 PM 12/06/2022   12:08 PM 01/25/2022    2:41 PM 02/15/2021    9:09 AM 11/30/2019    1:48 PM  PHQ 2/9 Scores  PHQ - 2 Score 0 0 0 0 0 0 0  PHQ- 9 Score  2         Fall Risk    01/30/2024    2:37 PM 01/20/2023    2:26 PM 09/16/2022    3:37 PM 06/14/2022    7:55 AM 04/16/2022    9:57 AM  Fall Risk   Falls in the past year? 0 0 1 0 0  Number falls in past yr: 0 0 0 0   Injury with Fall? 0 0 1 0   Risk for fall due to : No Fall Risks No Fall Risks     Follow up Falls prevention discussed;Falls evaluation completed Falls prevention discussed       MEDICARE RISK AT HOME: Medicare Risk at Home Any stairs in or around the home?: No If so, are there any without handrails?: No Home free of loose throw rugs in walkways, pet beds, electrical cords, etc?: Yes Adequate lighting in your home to reduce risk of falls?: Yes Life alert?: No Use of a cane, walker or w/c?: No Grab bars in the bathroom?: Yes Shower chair or bench in shower?: Yes Elevated toilet seat or a handicapped toilet?: No  TIMED UP AND GO:  Was the test performed?  No    Cognitive Function:    03/10/2018   10:44 AM 09/24/2016    2:07 PM  MMSE - Mini Mental State Exam  Not completed: -- --  06/14/2022    3:00 PM  Montreal Cognitive Assessment   Visuospatial/ Executive (0/5) 3  Naming (0/3) 3  Attention: Read list of digits (0/2) 2  Attention: Read list of letters (0/1) 1  Attention: Serial 7 subtraction starting at 100 (0/3) 2  Language: Repeat phrase (0/2) 2  Language : Fluency (0/1) 0  Abstraction (0/2) 1  Delayed Recall (0/5) 2  Orientation (0/6) 6  Total 22  Adjusted Score (based on education) 22      01/30/2024    2:38 PM 01/20/2023    2:28 PM  01/25/2022    2:46 PM 11/30/2019    1:49 PM  6CIT Screen  What Year? 0 points 0 points 0 points 0 points  What month? 0 points 0 points 0 points 0 points  What time? 0 points 0 points 0 points 0 points  Count back from 20 0 points 0 points 0 points 0 points  Months in reverse 0 points 0 points 0 points 0 points  Repeat phrase 0 points 0 points 0 points 0 points  Total Score 0 points 0 points 0 points 0 points    Immunizations Immunization History  Administered Date(s) Administered   Fluad Quad(high Dose 65+) 09/18/2020, 09/20/2022   Influenza Whole 10/03/2011   Influenza, High Dose Seasonal PF 09/24/2016   Influenza,inj,Quad PF,6+ Mos 09/11/2018, 08/26/2019   Influenza-Unspecified 10/14/2015, 09/12/2017, 09/14/2018, 09/12/2023   MODERNA COVID-19 SARS-COV-2 PEDS BIVALENT BOOSTER 67yr-33yr 09/27/2020   Pneumococcal Conjugate-13 02/03/2014   Pneumococcal Polysaccharide-23 03/28/2010, 09/14/2018   Td 01/07/2008   Tdap 12/01/2018   Zoster Recombinant(Shingrix) 09/14/2018, 11/17/2018    TDAP status: Up to date  Flu Vaccine status: Up to date  Pneumococcal vaccine status: Up to date  Covid-19 vaccine status: Declined, Education has been provided regarding the importance of this vaccine but patient still declined. Advised may receive this vaccine at local pharmacy or Health Dept.or vaccine clinic. Aware to provide a copy of the vaccination record if obtained from local pharmacy or Health Dept. Verbalized acceptance and understanding.  Qualifies for Shingles Vaccine? Yes   Zostavax completed Yes   Shingrix Completed?: Yes  Screening Tests Health Maintenance  Topic Date Due   FOOT EXAM  02/19/2022   COVID-19 Vaccine (2 - 2024-25 season) 08/03/2023   OPHTHALMOLOGY EXAM  05/05/2024   Diabetic kidney evaluation - eGFR measurement  06/09/2024   Diabetic kidney evaluation - Urine ACR  06/09/2024   HEMOGLOBIN A1C  07/04/2024   Medicare Annual Wellness (AWV)  01/29/2025   DTaP/Tdap/Td  (3 - Td or Tdap) 12/01/2028   Pneumonia Vaccine 44+ Years old  Completed   INFLUENZA VACCINE  Completed   Hepatitis C Screening  Completed   Zoster Vaccines- Shingrix  Completed   HPV VACCINES  Aged Out   Colonoscopy  Discontinued    Health Maintenance  Health Maintenance Due  Topic Date Due   FOOT EXAM  02/19/2022   COVID-19 Vaccine (2 - 2024-25 season) 08/03/2023        Additional Screening:    Vision Screening: Recommended annual ophthalmology exams for early detection of glaucoma and other disorders of the eye. Is the patient up to date with their annual eye exam?  Yes  Who is the provider or what is the name of the office in which the patient attends annual eye exams? [facility] If pt is not established with a provider, would they like to be referred to a provider to establish care? No .   Dental Screening: Recommended  annual dental exams for proper oral hygiene  Diabetic Foot Exam: Diabetic Foot Exam: Overdue, Pt has been advised about the importance in completing this exam. Pt is scheduled for diabetic foot exam on Deferred.  Community Resource Referral / Chronic Care Management:  CRR required this visit?  No   CCM required this visit?  No     Plan:     I have personally reviewed and noted the following in the patient's chart:   Medical and social history Use of alcohol, tobacco or illicit drugs  Current medications and supplements including opioid prescriptions. Patient is not currently taking opioid prescriptions. Functional ability and status Nutritional status Physical activity Advanced directives List of other physicians Hospitalizations, surgeries, and ER visits in previous 12 months Vitals Screenings to include cognitive, depression, and falls Referrals and appointments  In addition, I have reviewed and discussed with patient certain preventive protocols, quality metrics, and best practice recommendations. A written personalized care plan  for preventive services as well as general preventive health recommendations were provided to patient.     Tillie Rung, LPN   01/21/2541   After Visit Summary: (MyChart) Due to this being a telephonic visit, the after visit summary with patients personalized plan was offered to patient via MyChart   Nurse Notes: None

## 2024-01-31 ENCOUNTER — Other Ambulatory Visit: Payer: Self-pay | Admitting: Family Medicine

## 2024-02-10 ENCOUNTER — Other Ambulatory Visit: Payer: Self-pay | Admitting: Family Medicine

## 2024-02-10 DIAGNOSIS — E119 Type 2 diabetes mellitus without complications: Secondary | ICD-10-CM

## 2024-02-11 ENCOUNTER — Encounter: Payer: Self-pay | Admitting: Family Medicine

## 2024-02-16 IMAGING — DX DG HIP (WITH OR WITHOUT PELVIS) 2-3V*L*
3 series · 3 of 3 positions shown · non-contrast
Comparison: None Available.

CLINICAL DATA: Left hip pain for several months

EXAM:
DG HIP (WITH OR WITHOUT PELVIS) 2-3V LEFT

[pelvis ap]
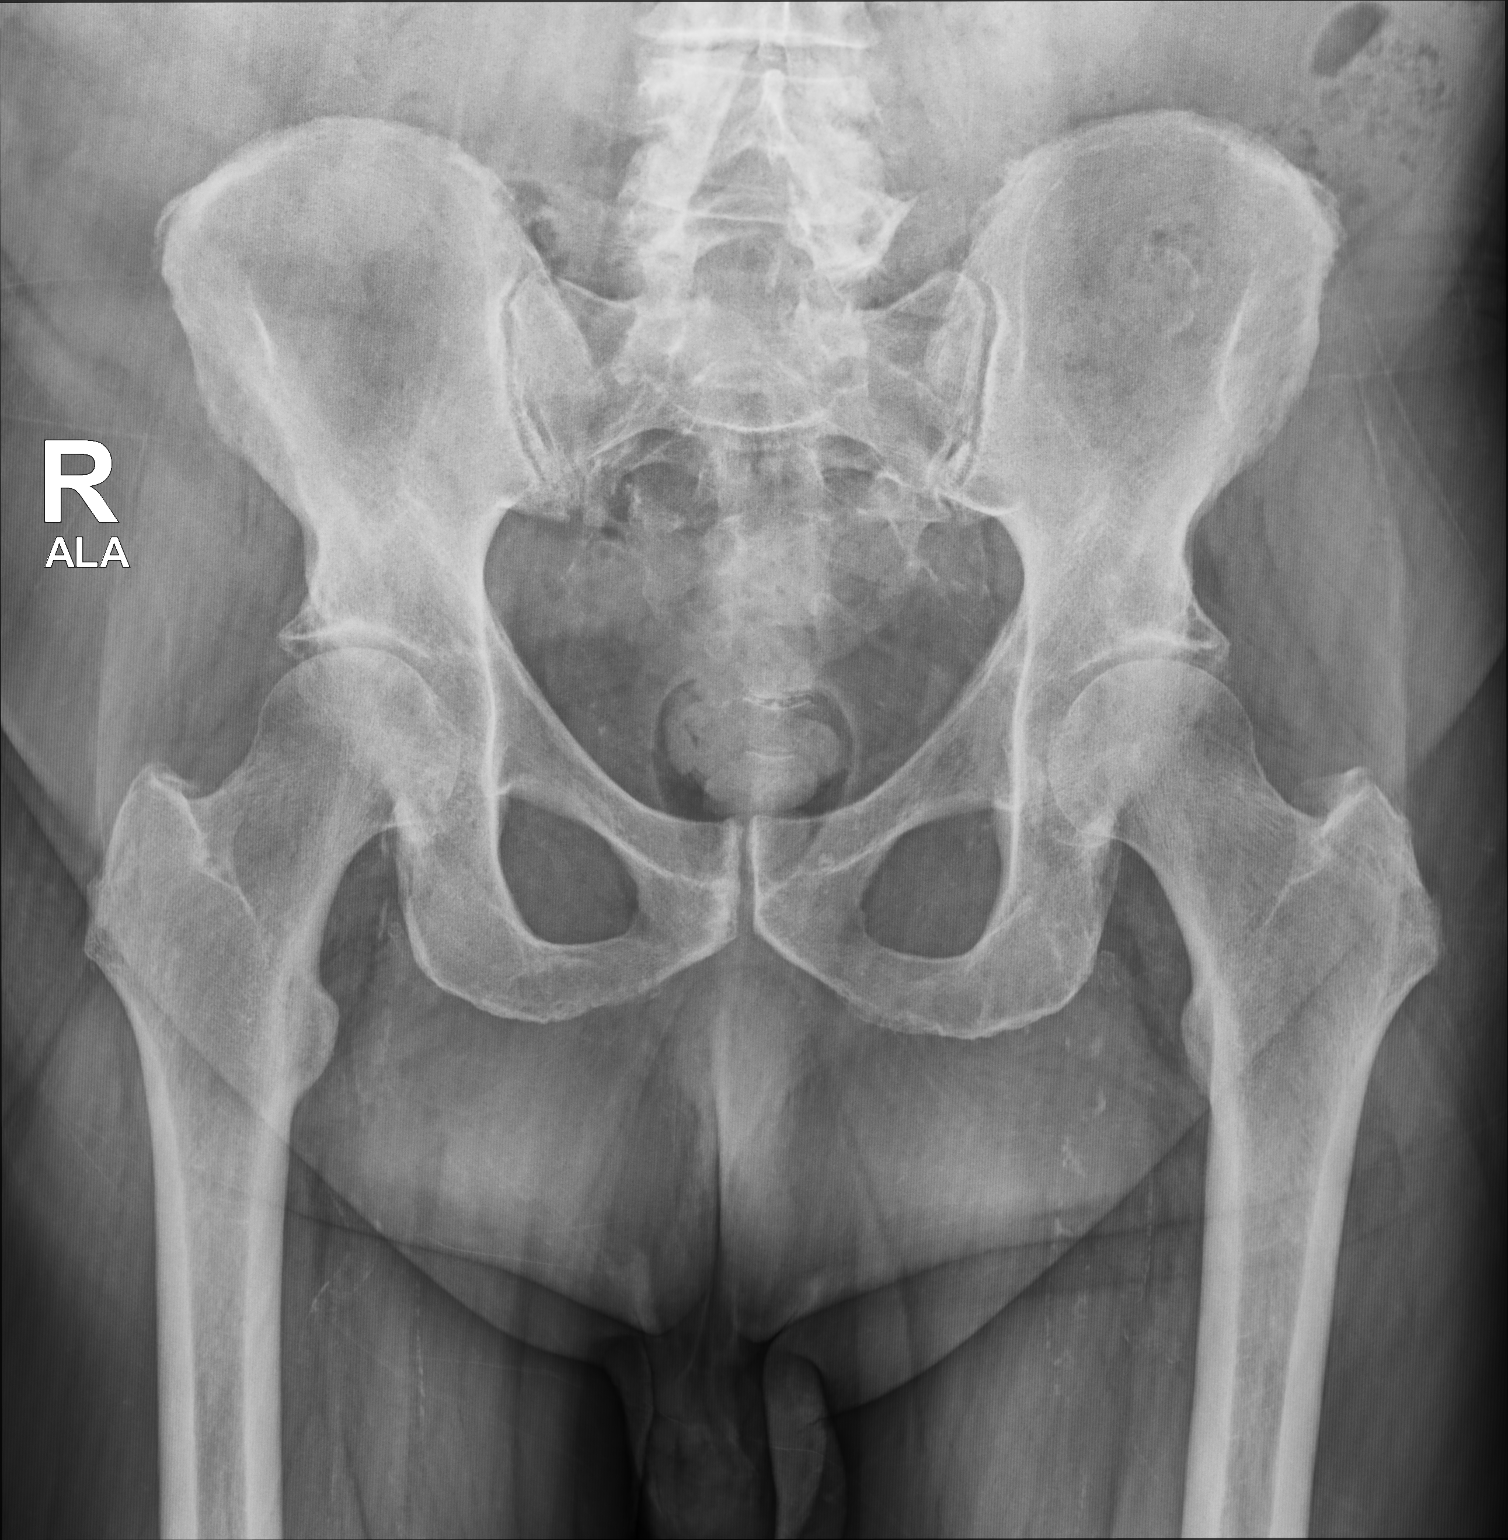

[hip joint ap]
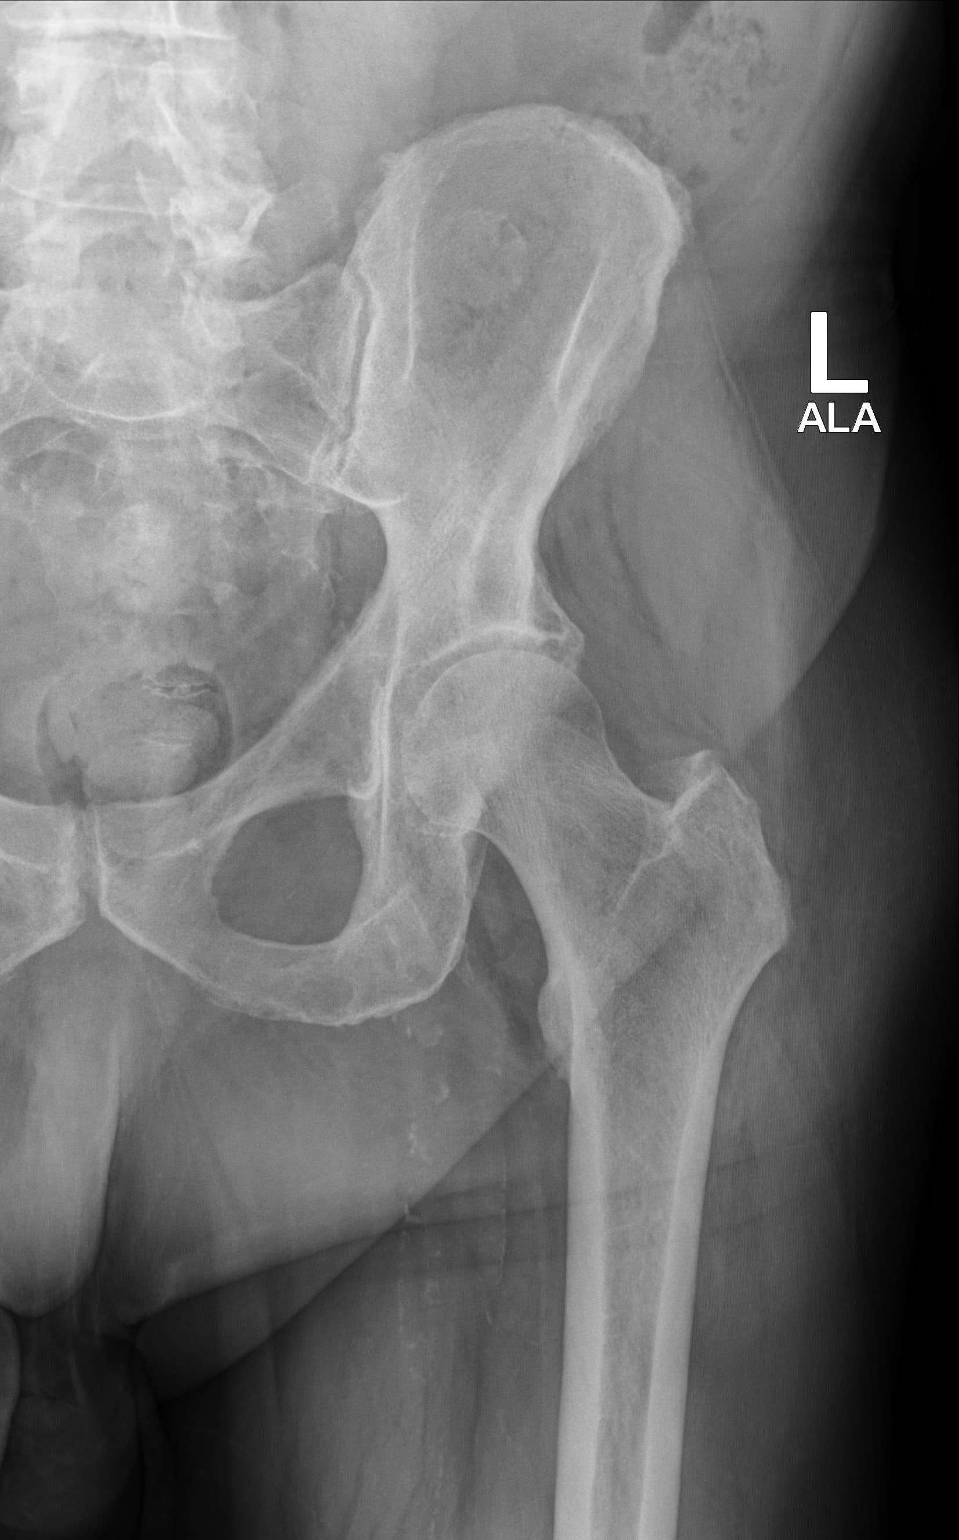

[hip (frog leg) lat]
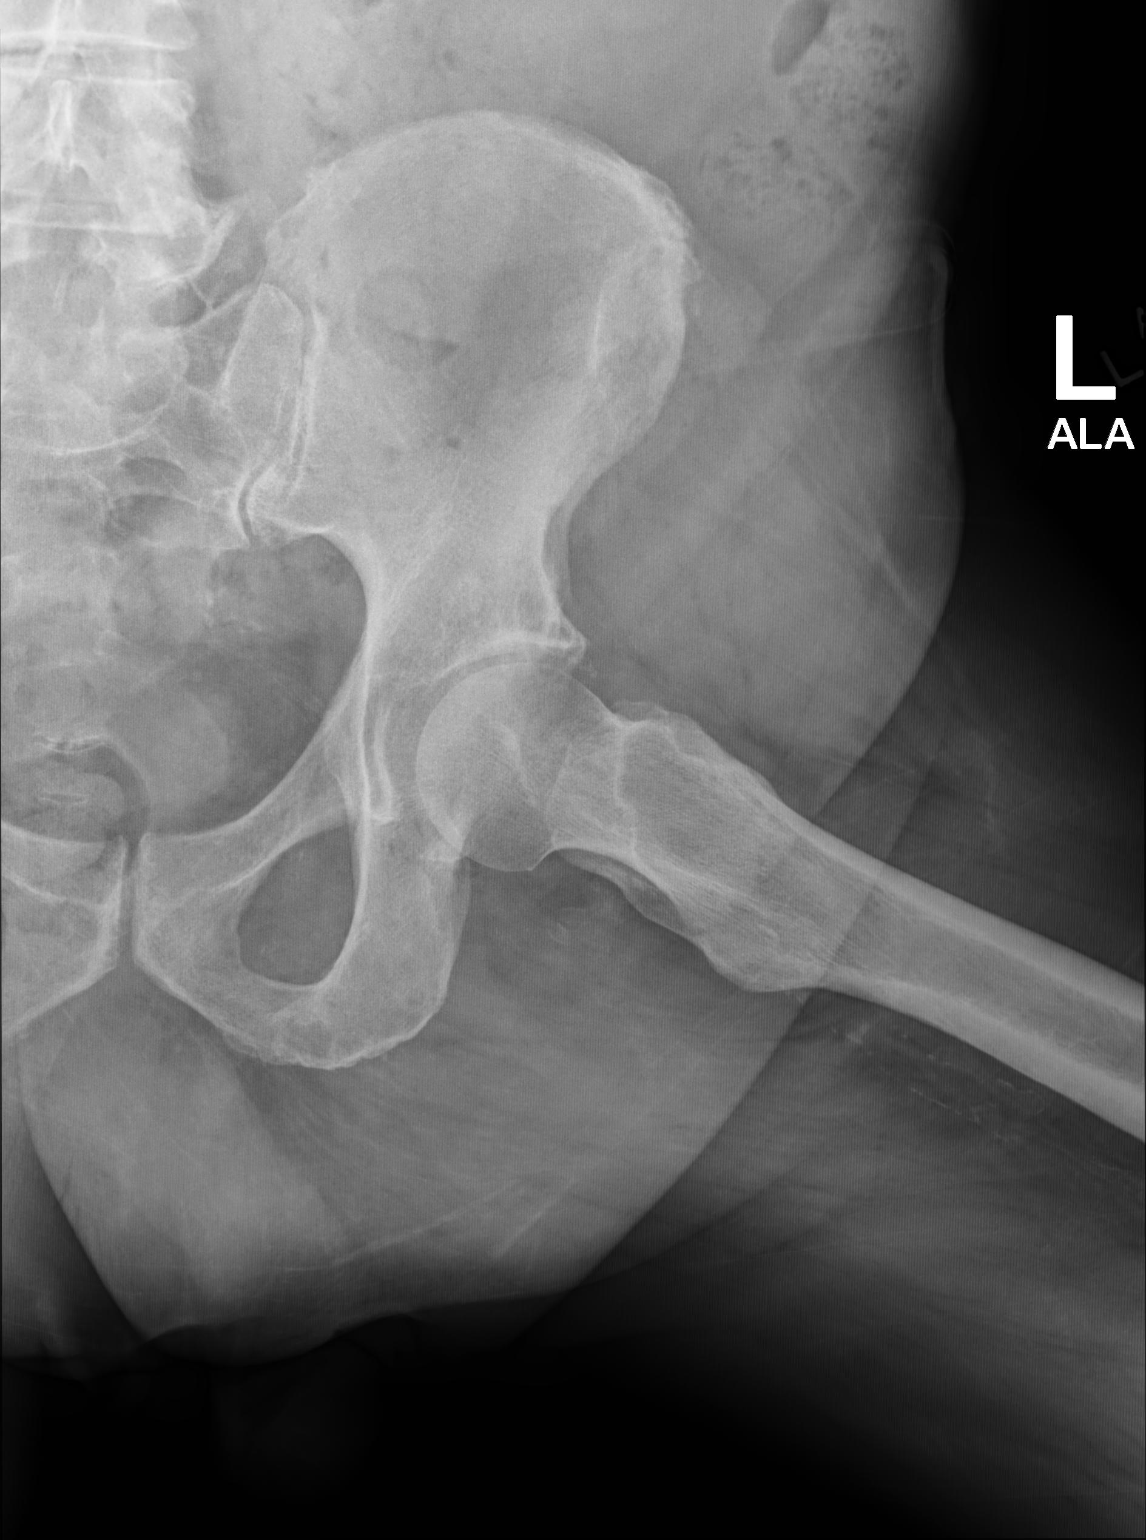

[3 of 3 positions shown; findings below may reference images not displayed]

FINDINGS: Frontal view of the pelvis as well as frontal and frogleg lateral
views of the left hip are obtained. No acute fracture, subluxation,
or dislocation. Symmetrical bilateral hip osteoarthritis. Moderate
lower lumbar degenerative changes. Sacroiliac joints are normal.
Diffuse atherosclerosis.
IMPRESSION: 1. Symmetrical bilateral hip osteoarthritis.  No acute fracture.
2. Moderate lower lumbar degenerative changes.

## 2024-02-25 ENCOUNTER — Ambulatory Visit: Admitting: Family Medicine

## 2024-02-27 ENCOUNTER — Encounter: Payer: Self-pay | Admitting: Family Medicine

## 2024-02-27 ENCOUNTER — Ambulatory Visit: Admitting: Family Medicine

## 2024-02-27 VITALS — BP 126/80 | HR 87 | Temp 98.3°F | Wt 213.3 lb

## 2024-02-27 DIAGNOSIS — M2550 Pain in unspecified joint: Secondary | ICD-10-CM | POA: Diagnosis not present

## 2024-02-27 DIAGNOSIS — I10 Essential (primary) hypertension: Secondary | ICD-10-CM | POA: Diagnosis not present

## 2024-02-27 DIAGNOSIS — R3915 Urgency of urination: Secondary | ICD-10-CM

## 2024-02-27 MED ORDER — GEMTESA 75 MG PO TABS
75.0000 mg | ORAL_TABLET | Freq: Every day | ORAL | 3 refills | Status: AC
Start: 2024-02-27 — End: ?

## 2024-02-27 NOTE — Progress Notes (Signed)
 Established Patient Office Visit  Subjective   Patient ID: Clifford Gibson, male    DOB: 1946/08/22  Age: 78 y.o. MRN: [id number]  Chief Complaint  Patient presents with   Knee Pain    Patient complains of left knee pain   Ankle Pain    Patient complains of Bilateral ankle pain     HPI   Yuvan has history of hypertension, GERD, type 2 diabetes, hyperlipidemia.,  and mild cognitive impairment.  Seen today with increased arthralgias involving multiple joints.  He does have known history of osteoarthritis with previous right total knee replacement.  Has recently had some increased left knee pains along with bilateral ankle pain and some bilateral hip pain.  He relates both his sister and brother have had rheumatoid arthritis. He does note some stiffness of the left knee.  Has never noticed any warmth or erythema.  Has noticeable more edema around his ankles recently.  No statin use.  Currently on Repatha for hyperlipidemia  Does have type 2 diabetes which has been well-controlled with A1c back in February of 6.2  He has some urine urgency.  Currently on Myrbetriq 50 mg once daily but does not seem to be offering much benefit.  Has urgency both day and nighttime.  He feels like there may be some functional component to incontinence as he has stress at times about whether he can make it to the bathroom in time.  Avoids regular's of caffeine.  No alcohol use.  No recent burning with urination.  He has hypertension treated with Diovan HCTZ.  Initial blood pressure up today but improved significantly after rest  Past Medical History:  Diagnosis Date   Acute serous otitis media 02/22/2010   Arthritis    Diabetes mellitus without complication (HCC)    ERECTILE DYSFUNCTION 04/25/2009   GERD 03/24/2009   HYPERLIPIDEMIA 03/28/2010   HYPERTENSION 03/24/2009   OBESITY, MODERATE 04/25/2009   PONV (postoperative nausea and vomiting)    Past Surgical History:  Procedure Laterality Date    COLONOSCOPY N/A 06/21/2015   Procedure: COLONOSCOPY;  Surgeon: Malissa Hippo, MD;  [facility];  Service: Endoscopy;  Laterality: N/A;  930 - moved to 7/20 @ 7:30   NASAL SEPTUM SURGERY  1974   TONSILLECTOMY     3 yrs. old   TOTAL KNEE ARTHROPLASTY  06/11/2012   Procedure: TOTAL KNEE ARTHROPLASTY;  Surgeon: Javier Docker, MD;  Location: [facility];  Service: Orthopedics;  Laterality: Right;    reports that he has never smoked. He has never used smokeless tobacco. He reports that he does not drink alcohol and does not use drugs. family history includes Dementia in his father; Scleroderma in his mother. Allergies  Allergen Reactions   Lipitor [Atorvastatin Calcium] Other (See Comments)   Penicillins Other (See Comments)    Reaction=bleeding,diarrhea   Statins Other (See Comments)    Myalgia     Review of Systems  Eyes:  Negative for blurred vision.  Respiratory:  Negative for shortness of breath.   Cardiovascular:  Negative for chest pain.  Genitourinary:  Positive for urgency. Negative for dysuria.  Musculoskeletal:  Positive for joint pain. Negative for myalgias.  Neurological:  Negative for dizziness, weakness and headaches.      Objective:     BP 126/80 (BP Location: Left Arm, Cuff Size: Normal)   Pulse 87   Temp 98.3 F (36.8 C) (Oral)   Wt 213 lb 4.8 oz (96.8 kg)   SpO2 95%  BMI 32.43 kg/m  BP Readings from Last 3 Encounters:  02/27/24 126/80  01/05/24 (!) 140/78  06/11/23 (!) 140/86   Wt Readings from Last 3 Encounters:  02/27/24 213 lb 4.8 oz (96.8 kg)  01/30/24 215 lb (97.5 kg)  01/05/24 215 lb 14.4 oz (97.9 kg)      Physical Exam Vitals reviewed.  Constitutional:      General: He is not in acute distress.    Appearance: He is not ill-appearing.  Cardiovascular:     Rate and Rhythm: Normal rate and regular rhythm.  Pulmonary:     Effort: Pulmonary effort is normal.     Breath sounds: Normal breath sounds. No wheezing or rales.   Musculoskeletal:     Comments: Does have some mild edema involving both ankles but no warmth.  Left knee reveals good fairly fluid range of motion.  Minimal crepitus.  No localized joint tenderness.  Neurological:     Mental Status: He is alert.      No results found for any visits on 02/27/24.    The ASCVD Risk score (Arnett DK, et al., 2019) failed to calculate for the following reasons:   The valid total cholesterol range is 130 to 320 mg/dL    Assessment & Plan:   #1 polyarthralgias.  Known history of osteoarthritis with prior right total knee replacement.  He is concerned because of family history of 2 siblings with rheumatoid.  Does not show objective signs of inflammation such as warmth or erythema but does have some bilateral ankle edema low with several other joints as above that have been more bothersome recently.  Check labs including ANA, sed rate, C-reactive protein, CCP antibody, rheumatoid factor Suspect he has osteoarthritis involving left knee.  If above labs unremarkable consider setting up outpatient physical therapy to focus on good strengthening regimen for his lower extremities.  Also suggested Tylenol 500 mg 1-2 every 6-8 hours as needed for pain  #2 urinary urgency.  Has not gotten much response with Myrbetriq.  Continue to avoid regular caffeine.  Consider trial of Gemtesa 75 mg once daily if we get this covered  #3 hypertension.  Initial reading was up today but improved significantly after rest.  Continue valsartan /HCTZ   No follow-ups on file.    Evelena Peat, MD

## 2024-02-27 NOTE — Patient Instructions (Signed)
 Consider trial of Tylenol 500 mg 1-2 every 6 hours as needed for arthritis pain.

## 2024-02-28 ENCOUNTER — Other Ambulatory Visit: Payer: Self-pay | Admitting: Family Medicine

## 2024-02-29 LAB — SEDIMENTATION RATE: Sed Rate: 2 mm/h (ref 0–20)

## 2024-02-29 LAB — CYCLIC CITRUL PEPTIDE ANTIBODY, IGG: Cyclic Citrullin Peptide Ab: <16 U

## 2024-02-29 LAB — C-REACTIVE PROTEIN: CRP: <3 mg/L (ref <8.0)

## 2024-02-29 LAB — ANA: Anti Nuclear Antibody (ANA): NEGATIVE

## 2024-02-29 LAB — RHEUMATOID FACTOR: Rheumatoid fact SerPl-aCnc: <10 [IU]/mL (ref <14)

## 2024-03-01 ENCOUNTER — Encounter: Payer: Self-pay | Admitting: *Deleted

## 2024-03-02 ENCOUNTER — Encounter: Payer: Self-pay | Admitting: Family Medicine

## 2024-03-02 DIAGNOSIS — Z9181 History of falling: Secondary | ICD-10-CM

## 2024-03-04 ENCOUNTER — Other Ambulatory Visit: Payer: Self-pay | Admitting: Family Medicine

## 2024-03-04 DIAGNOSIS — I10 Essential (primary) hypertension: Secondary | ICD-10-CM

## 2024-03-11 ENCOUNTER — Ambulatory Visit: Attending: Family Medicine

## 2024-03-11 DIAGNOSIS — R2681 Unsteadiness on feet: Secondary | ICD-10-CM | POA: Diagnosis not present

## 2024-03-11 DIAGNOSIS — Z9181 History of falling: Secondary | ICD-10-CM | POA: Diagnosis not present

## 2024-03-11 DIAGNOSIS — M6281 Muscle weakness (generalized): Secondary | ICD-10-CM | POA: Diagnosis not present

## 2024-03-11 NOTE — Therapy (Addendum)
 OUTPATIENT PHYSICAL THERAPY LOWER EXTREMITY EVALUATION   Patient Name: CORTEZ FLIPPEN MRN: [id number] DOB:06-29-1946, 78 y.o., male Today's Date: 03/11/2024  END OF SESSION:  PT End of Session - 03/11/24 1148     Visit Number 1    Number of Visits 12    Date for PT Re-Evaluation 05/28/24    PT Start Time 0930    PT Stop Time 1025    PT Time Calculation (min) 55 min    Activity Tolerance Patient tolerated treatment well    Behavior During Therapy [facility] for tasks assessed/performed             Past Medical History:  Diagnosis Date   Acute serous otitis media 02/22/2010   Arthritis    Diabetes mellitus without complication (HCC)    ERECTILE DYSFUNCTION 04/25/2009   GERD 03/24/2009   HYPERLIPIDEMIA 03/28/2010   HYPERTENSION 03/24/2009   OBESITY, MODERATE 04/25/2009   PONV (postoperative nausea and vomiting)    Past Surgical History:  Procedure Laterality Date   COLONOSCOPY N/A 06/21/2015   Procedure: COLONOSCOPY;  Surgeon: Malissa Hippo, MD;  [facility];  Service: Endoscopy;  Laterality: N/A;  930 - moved to 7/20 @ 7:30   NASAL SEPTUM SURGERY  1974   TONSILLECTOMY     3 yrs. old   TOTAL KNEE ARTHROPLASTY  06/11/2012   Procedure: TOTAL KNEE ARTHROPLASTY;  Surgeon: Javier Docker, MD;  Location: [facility];  Service: Orthopedics;  Laterality: Right;   Patient Active Problem List   Diagnosis Date Noted   Mild cognitive impairment 06/14/2022   Rhinorrhea 06/14/2022   Type 2 diabetes mellitus, controlled (HCC) 07/04/2015   Hyperglycemia 02/02/2015   Allergic rhinitis 05/14/2013   Hyperlipemia 03/28/2010   ACUTE SEROUS OTITIS MEDIA 02/22/2010   OBESITY, MODERATE 04/25/2009   ERECTILE DYSFUNCTION 04/25/2009   Essential hypertension 03/24/2009   GERD 03/24/2009    PCP: Kristian Covey, MD  REFERRING PROVIDER: Kristian Covey, MD  REFERRING DIAG: [phone] (ICD-10-CM) - At risk for falls  THERAPY DIAG:  Unsteadiness on feet  Muscle weakness  (generalized)  Rationale for Evaluation and Treatment: Rehabilitation  ONSET DATE: 2 months ago   SUBJECTIVE:   SUBJECTIVE STATEMENT: Patient reported experiencing balance difficulties, right ankle pain, and left knee pain that began 2 months ago while ambulating around his home. He described his pain as 0/10 at rest and 3/5 when walking. The patient also noted a history of vertigo, which contributes to his balance impairments. Both pain and balance issues negatively affect his ability to perform ADL's and ambulate safely. Pain is most aggravated during walking, while balance is most affected when turning his head quickly to the left.  PERTINENT HISTORY: Mild cognitive impairment, type 2 diabetes mellitus, moderate obesity, hyperglycemia, hypertension, vertigo per patient report  PAIN:  Are you having pain? Yes: NPRS scale: 0/10 at rest, 3-5/10 when walking Pain location: dorsal aspect of right midfoot and navicular tuberosity, left pes anserine region of left knee  Pain description: sharp at both right ankle and left knee  Aggravating factors: walking  Relieving factors: Rest   PRECAUTIONS: Fall  RED FLAGS: None   WEIGHT BEARING RESTRICTIONS: No  FALLS:  Has patient fallen in last 6 months? No but indicated that he has a decrease in level of activity because of a fear of falling.   LIVING ENVIRONMENT: Lives with: lives with their spouse Lives in: House/apartment Stairs: Yes: Internal: 4 front porch steps; can reach both and External: 8  basement steps; can reach both Has following equipment at home: Single point cane and Walker - 4 wheeled  OCCUPATION: Retired   PLOF: Independent  PATIENT GOALS: decrease pain, walking, be able to perform ADL's with less difficulty.  NEXT MD VISIT: None scheduled  OBJECTIVE:  Note: Objective measures were completed at Evaluation unless otherwise noted.  COGNITION: Overall cognitive status:  WFL      SENSATION: WFL  POSTURE: rounded  shoulders  PALPATION: TTP of pes anserine region of left knee.  LOWER EXTREMITY ROM:  The patient was assessed in seated position due to precautionary measures related to a history of vertigo.   Active ROM Right eval Left eval  Hip flexion    Hip extension    Hip abduction    Hip adduction    Hip internal rotation    Hip external rotation    Knee flexion    Knee extension 11 degrees from neutral 26 degrees from neutral  Ankle dorsiflexion PROM: WFL with pain at end range PROM: [facility] with pain  Ankle plantarflexion PROM: WFL with pain at end range PROM: [facility] with pain at end range  Ankle inversion    Ankle eversion     (Blank rows = not tested)  LOWER EXTREMITY MMT:  MMT Right eval Left eval  Hip flexion 3- 3-  Hip extension    Hip abduction 3+ 3+  Hip adduction    Hip internal rotation    Hip external rotation    Knee flexion 4 4  Knee extension 3+ 3+   Ankle dorsiflexion 3+ 3+  Ankle plantarflexion    Ankle inversion    Ankle eversion     (Blank rows = not tested)  FUNCTIONAL TESTS:  5 times sit to stand: 29.2 seconds Timed up and go (TUG): 18.33 seconds  GAIT: Distance walked: 82 Assistive device utilized: None Level of assistance: Complete Independence Comments: Patient displayed a cautious gait. Slow, deliberate steps, short stride length, and feet kept close to the ground.                                                                                                                                 TREATMENT DATE: 03/11/2024                                      EXERCISE LOG  Exercise Repetitions and Resistance Comments  Seated Hip Abduction   5 reps    Seated Marches   5 reps    Toe Raises   5 reps    Heel Raises   5 reps         Blank cell = exercise not performed today   PATIENT EDUCATION:  Education details: Patient educated on HEP, objective findings, POC, prognosis and goals for therapy.  Person educated: Patient Education method:  Psychiatrist  comprehension: verbalized understanding and returned demonstration  HOME EXERCISE PROGRAM: [id number]  ASSESSMENT:  CLINICAL IMPRESSION: Patient is a 78 y.o. male who was seen today for physical therapy evaluation and treatment for poor balance. Objective findings consistent with balance deficits, left knee pain, right ankle pain include cautious gait, weakness in hip flexion, adduction, knee extension, and dorsiflexion; TTP over the left pes anserine region; and pain with passive dorsiflexion and plantarflexion of the right ankle. Patient also demonstrated prolonged TUG and 5xSTS times with unsteady, cautious movements and upper extremity compensation, indicating impaired balance, reduced lower extremity strength, and increased fall risk. He was provided education on his HEP and demonstrated and verbalized compliance. Patient would benefit from skilled physical therapy to address impairments in order to improve balance and performance of ADL's.    OBJECTIVE IMPAIRMENTS: decreased activity tolerance, decreased balance, decreased cognition, decreased endurance, decreased mobility, difficulty walking, decreased strength, obesity, and pain.   ACTIVITY LIMITATIONS: carrying, lifting, bending, sitting, standing, squatting, stairs, transfers, bed mobility, and dressing  PARTICIPATION LIMITATIONS: cleaning, laundry, community activity, and yard work  PERSONAL FACTORS: Age and 3+ comorbidities: Mild cognitive impairment, type 2 diabetes mellitus, moderate obesity, hyperglycemia, hypertension, vertigo per patient report  are also affecting patient's functional outcome.   REHAB POTENTIAL: Good  CLINICAL DECISION MAKING: Evolving/moderate complexity  EVALUATION COMPLEXITY: Moderate   GOALS: Goals reviewed with patient? No  SHORT TERM GOALS: Target date: 05/10/2024 The patient will be independent with their HEP. Baseline: Goal status: INITIAL  2.  Patient's  5xSTS score will decrease from 29.2 sec to 20 sec to support safer and more efficient performance  ADL's.  Baseline:  Goal status: INITIAL  3.  Patients knee flexion and extension MMT will increase to at least 4 in order to help with walking.  Baseline:  Goal status: INITIAL  4. Patient's TUG score will decrease from 18.33 sec to 14.93 sec to reduce fall risk.  Baseline:   Goal status: INITIAL   LONG TERM GOALS: Target date: 05/28/2024  Patient's pain at its worst will decrease from 3.5 to 1.5 which will enable greater participation in community ambulation.   Baseline:  Goal status: INITIAL  2.    Patient's TUG score will decrease from 14.93 sec to 11.53 sec to reduce fall risk. Baseline:  Goal status: INITIAL  3.  Patient's 5xSTS score will decrease from 20 sec to 12 ensure greater confidence in performance of ADL's.  Baseline:  Goal status: INITIAL  4.  Patient will report being able to walk for at least 5 minutes on uneven surfaces to ensure greater confidence in balance.  Baseline:  Goal status: INITIAL   PLAN:  PT FREQUENCY: 2x/week  PT DURATION: other: 6 weeks. Patient leaves on 03/16/24 for a month  PLANNED INTERVENTIONS: 97164- PT Re-evaluation, 97110-Therapeutic exercises, 97530- Therapeutic activity, 97112- Neuromuscular re-education, 97535- Self Care, 76283- Manual therapy, [phone]- Gait training, [phone]- Electrical stimulation (unattended), 97016- Vasopneumatic device, Patient/Family education, Balance training, Stair training, Taping, Joint mobilization, Vestibular training, Cryotherapy, and Moist heat  PLAN FOR NEXT SESSION: Nustep, AAROM, balance training, LE muscle strengthening exercises, modalities as needed.    Zarria Towell, Student-PT 03/11/2024, 3:13 PM

## 2024-03-11 NOTE — Addendum Note (Signed)
 Addended by: Granville Lewis on: 03/11/2024 04:46 PM   Modules accepted: Orders

## 2024-03-12 ENCOUNTER — Ambulatory Visit

## 2024-03-12 DIAGNOSIS — Z9181 History of falling: Secondary | ICD-10-CM | POA: Diagnosis not present

## 2024-03-12 DIAGNOSIS — R2681 Unsteadiness on feet: Secondary | ICD-10-CM | POA: Diagnosis not present

## 2024-03-12 DIAGNOSIS — M6281 Muscle weakness (generalized): Secondary | ICD-10-CM | POA: Diagnosis not present

## 2024-03-12 NOTE — Therapy (Signed)
 OUTPATIENT PHYSICAL THERAPY LOWER EXTREMITY EVALUATION   Patient Name: Clifford Gibson MRN: [id number] DOB:[birth year], 78 y.o., male Today's Date: 03/12/2024  END OF SESSION:  PT End of Session - 03/12/24 1018     Visit Number 2    Number of Visits 12    Date for PT Re-Evaluation 05/28/24    Authorization Time Period 6//30/25    Authorization - Number of Visits 10     PT Start Time 1015    PT Stop Time 1057    PT Time Calculation (min) 42 min    Activity Tolerance Patient tolerated treatment well    Behavior During Therapy [facility] for tasks assessed/performed             Past Medical History:  Diagnosis Date   Acute serous otitis media 02/22/2010   Arthritis    Diabetes mellitus without complication (HCC)    ERECTILE DYSFUNCTION 04/25/2009   GERD 03/24/2009   HYPERLIPIDEMIA 03/28/2010   HYPERTENSION 03/24/2009   OBESITY, MODERATE 04/25/2009   PONV (postoperative nausea and vomiting)    Past Surgical History:  Procedure Laterality Date   COLONOSCOPY N/A 06/21/2015   Procedure: COLONOSCOPY;  Surgeon: Malissa Hippo, MD;  [facility];  Service: Endoscopy;  Laterality: N/A;  930 - moved to 7/20 @ 7:30   NASAL SEPTUM SURGERY  1974   TONSILLECTOMY     3 yrs. old   TOTAL KNEE ARTHROPLASTY  06/11/2012   Procedure: TOTAL KNEE ARTHROPLASTY;  Surgeon: Javier Docker, MD;  Location: [facility];  Service: Orthopedics;  Laterality: Right;   Patient Active Problem List   Diagnosis Date Noted   Mild cognitive impairment 06/14/2022   Rhinorrhea 06/14/2022   Type 2 diabetes mellitus, controlled (HCC) 07/04/2015   Hyperglycemia 02/02/2015   Allergic rhinitis 05/14/2013   Hyperlipemia 03/28/2010   ACUTE SEROUS OTITIS MEDIA 02/22/2010   OBESITY, MODERATE 04/25/2009   ERECTILE DYSFUNCTION 04/25/2009   Essential hypertension 03/24/2009   GERD 03/24/2009    PCP: Kristian Covey, MD  REFERRING PROVIDER: Kristian Covey, MD  REFERRING DIAG: [phone] (ICD-10-CM) - At risk  for falls  THERAPY DIAG:  Unsteadiness on feet  Muscle weakness (generalized)  Rationale for Evaluation and Treatment: Rehabilitation  ONSET DATE: 2 months ago   SUBJECTIVE:   SUBJECTIVE STATEMENT: Pt reports 5/10 left hip pain today.   PERTINENT HISTORY: Mild cognitive impairment, type 2 diabetes mellitus, moderate obesity, hyperglycemia, hypertension, vertigo per patient report  PAIN:  Are you having pain? Yes: NPRS scale: 5/10 Pain location: left hip Pain description: sharp at both right ankle and left knee  Aggravating factors: walking  Relieving factors: Rest   PRECAUTIONS: Fall  RED FLAGS: None   WEIGHT BEARING RESTRICTIONS: No  FALLS:  Has patient fallen in last 6 months? No but indicated that he has a decrease in level of activity because of a fear of falling.   LIVING ENVIRONMENT: Lives with: lives with their spouse Lives in: House/apartment Stairs: Yes: Internal: 4 front porch steps; can reach both and External: 8 basement steps; can reach both Has following equipment at home: Single point cane and Walker - 4 wheeled  OCCUPATION: Retired   PLOF: Independent  PATIENT GOALS: decrease pain, walking, be able to perform ADL's with less difficulty.  NEXT MD VISIT: None scheduled  OBJECTIVE:  Note: Objective measures were completed at Evaluation unless otherwise noted.  COGNITION: Overall cognitive status:  WFL      SENSATION: WFL  POSTURE: rounded shoulders  PALPATION: TTP of pes anserine region of left knee.  LOWER EXTREMITY ROM:  The patient was assessed in seated position due to precautionary measures related to a history of vertigo.   Active ROM Right eval Left eval  Hip flexion    Hip extension    Hip abduction    Hip adduction    Hip internal rotation    Hip external rotation    Knee flexion    Knee extension 11 degrees from neutral 26 degrees from neutral  Ankle dorsiflexion PROM: WFL with pain at end range PROM: [facility] with pain   Ankle plantarflexion PROM: WFL with pain at end range PROM: [facility] with pain at end range  Ankle inversion    Ankle eversion     (Blank rows = not tested)  LOWER EXTREMITY MMT:  MMT Right eval Left eval  Hip flexion 3- 3-  Hip extension    Hip abduction 3+ 3+  Hip adduction    Hip internal rotation    Hip external rotation    Knee flexion 4 4  Knee extension 3+ 3+   Ankle dorsiflexion 3+ 3+  Ankle plantarflexion    Ankle inversion    Ankle eversion     (Blank rows = not tested)  FUNCTIONAL TESTS:  5 times sit to stand: 29.2 seconds Timed up and go (TUG): 18.33 seconds  GAIT: Distance walked: 82 Assistive device utilized: None Level of assistance: Complete Independence Comments: Patient displayed a cautious gait. Slow, deliberate steps, short stride length, and feet kept close to the ground.                                                                                                                                 TREATMENT DATE:    03/12/24                         EXERCISE LOG  Exercise Repetitions and Resistance Comments  Nustep Lvl 3 x 15 mins   Rockerboard 4 mins   Heel/Toe Raises 25 reps    LAQs 2# x 20 reps bil   Seated Marches 2# x 20 reps bil   Seated Hip Abduction Red x 3 mins   Seated Hip Adduction 3 mins   Seated Ham Curls Red x 20 reps bil   STS 10 reps BUE support    Blank cell = exercise not performed today   03/11/2024                                    EXERCISE LOG  Exercise Repetitions and Resistance Comments  Seated Hip Abduction   5 reps    Seated Marches   5 reps    Toe Raises   5 reps    Heel Raises   5 reps  Blank cell = exercise not performed today   PATIENT EDUCATION:  Education details: Patient educated on HEP, objective findings, POC, prognosis and goals for therapy.  Person educated: Patient Education method: Medical illustrator Education comprehension: verbalized understanding and returned  demonstration  HOME EXERCISE PROGRAM: [id number]  ASSESSMENT:  CLINICAL IMPRESSION: Pt arrives for today's treatment session reporting 5/10 left hip pain.  Pt states that he is leaving to go to the beach for a month on Tuesday.  Pt introduced to several standing and seated BLE exercises to increase strength, function, stability, and safety while reducing pain.  Pt requiring min cues for proper technique with newly added exercises.  Pt denied any change in pain at completion of today's treatment session and reports "feeling good."  OBJECTIVE IMPAIRMENTS: decreased activity tolerance, decreased balance, decreased cognition, decreased endurance, decreased mobility, difficulty walking, decreased strength, obesity, and pain.   ACTIVITY LIMITATIONS: carrying, lifting, bending, sitting, standing, squatting, stairs, transfers, bed mobility, and dressing  PARTICIPATION LIMITATIONS: cleaning, laundry, community activity, and yard work  PERSONAL FACTORS: Age and 3+ comorbidities: Mild cognitive impairment, type 2 diabetes mellitus, moderate obesity, hyperglycemia, hypertension, vertigo per patient report  are also affecting patient's functional outcome.   REHAB POTENTIAL: Good  CLINICAL DECISION MAKING: Evolving/moderate complexity  EVALUATION COMPLEXITY: Moderate   GOALS: Goals reviewed with patient? No  SHORT TERM GOALS: Target date: 05/10/2024 The patient will be independent with their HEP. Baseline: Goal status: INITIAL  2.  Patient's 5xSTS score will decrease from 29.2 sec to 20 sec to support safer and more efficient performance  ADL's.  Baseline:  Goal status: INITIAL  3.  Patients knee flexion and extension MMT will increase to at least 4 in order to help with walking.  Baseline:  Goal status: INITIAL  4. Patient's TUG score will decrease from 18.33 sec to 14.93 sec to reduce fall risk.  Baseline:   Goal status: INITIAL   LONG TERM GOALS: Target date: 05/28/2024  Patient's  pain at its worst will decrease from 3.5 to 1.5 which will enable greater participation in community ambulation.   Baseline:  Goal status: INITIAL  2.    Patient's TUG score will decrease from 14.93 sec to 11.53 sec to reduce fall risk. Baseline:  Goal status: INITIAL  3.  Patient's 5xSTS score will decrease from 20 sec to 12 ensure greater confidence in performance of ADL's.  Baseline:  Goal status: INITIAL  4.  Patient will report being able to walk for at least 5 minutes on uneven surfaces to ensure greater confidence in balance.  Baseline:  Goal status: INITIAL  PLAN:  PT FREQUENCY: 2x/week  PT DURATION: other: 6 weeks. Patient leaves on 03/16/24 for a month  PLANNED INTERVENTIONS: 97164- PT Re-evaluation, 97110-Therapeutic exercises, 97530- Therapeutic activity, 97112- Neuromuscular re-education, 97535- Self Care, 16109- Manual therapy, [phone]- Gait training, [phone]- Electrical stimulation (unattended), 97016- Vasopneumatic device, Patient/Family education, Balance training, Stair training, Taping, Joint mobilization, Vestibular training, Cryotherapy, and Moist heat  PLAN FOR NEXT SESSION: Nustep, AAROM, balance training, LE muscle strengthening exercises, modalities as needed.    Newman Pies, PTA 03/12/2024, 11:04 AM

## 2024-04-08 ENCOUNTER — Other Ambulatory Visit: Payer: Self-pay | Admitting: Family Medicine

## 2024-04-16 ENCOUNTER — Ambulatory Visit: Attending: Family Medicine

## 2024-04-16 DIAGNOSIS — M6281 Muscle weakness (generalized): Secondary | ICD-10-CM | POA: Diagnosis not present

## 2024-04-16 DIAGNOSIS — R2681 Unsteadiness on feet: Secondary | ICD-10-CM | POA: Diagnosis not present

## 2024-04-16 NOTE — Therapy (Signed)
 OUTPATIENT PHYSICAL THERAPY LOWER EXTREMITY TREATMENT   Patient Name: Clifford Gibson MRN: [id number] DOB:Jul 24, 1946, 78 y.o., male Today's Date: 04/16/2024  END OF SESSION:  PT End of Session - 04/16/24 0932     Visit Number 3    Number of Visits 12    Date for PT Re-Evaluation 05/28/24    Authorization Time Period 6//30/25    Authorization - Number of Visits 10    PT Start Time 0930    PT Stop Time 1013    PT Time Calculation (min) 43 min    Activity Tolerance Patient tolerated treatment well    Behavior During Therapy [facility] for tasks assessed/performed             Past Medical History:  Diagnosis Date   Acute serous otitis media 02/22/2010   Arthritis    Diabetes mellitus without complication (HCC)    ERECTILE DYSFUNCTION 04/25/2009   GERD 03/24/2009   HYPERLIPIDEMIA 03/28/2010   HYPERTENSION 03/24/2009   OBESITY, MODERATE 04/25/2009   PONV (postoperative nausea and vomiting)    Past Surgical History:  Procedure Laterality Date   COLONOSCOPY N/A 06/21/2015   Procedure: COLONOSCOPY;  Surgeon: Ruby Corporal, MD;  [facility];  Service: Endoscopy;  Laterality: N/A;  930 - moved to 7/20 @ 7:30   NASAL SEPTUM SURGERY  1974   TONSILLECTOMY     3 yrs. old   TOTAL KNEE ARTHROPLASTY  06/11/2012   Procedure: TOTAL KNEE ARTHROPLASTY;  Surgeon: Loel Ring, MD;  Location: [facility];  Service: Orthopedics;  Laterality: Right;   Patient Active Problem List   Diagnosis Date Noted   Mild cognitive impairment 06/14/2022   Rhinorrhea 06/14/2022   Type 2 diabetes mellitus, controlled (HCC) 07/04/2015   Hyperglycemia 02/02/2015   Allergic rhinitis 05/14/2013   Hyperlipemia 03/28/2010   ACUTE SEROUS OTITIS MEDIA 02/22/2010   OBESITY, MODERATE 04/25/2009   ERECTILE DYSFUNCTION 04/25/2009   Essential hypertension 03/24/2009   GERD 03/24/2009    PCP: Marquetta Sit, MD  REFERRING PROVIDER: Marquetta Sit, MD  REFERRING DIAG: [phone] (ICD-10-CM) - At risk  for falls  THERAPY DIAG:  Unsteadiness on feet  Muscle weakness (generalized)  Rationale for Evaluation and Treatment: Rehabilitation  ONSET DATE: 2 months ago   SUBJECTIVE:   SUBJECTIVE STATEMENT: Pt denies any pain today.  No incidents to reports since last treatment session.   PERTINENT HISTORY: Mild cognitive impairment, type 2 diabetes mellitus, moderate obesity, hyperglycemia, hypertension, vertigo per patient report  PAIN:  Are you having pain? No  PRECAUTIONS: Fall  RED FLAGS: None   WEIGHT BEARING RESTRICTIONS: No  FALLS:  Has patient fallen in last 6 months? No but indicated that he has a decrease in level of activity because of a fear of falling.   LIVING ENVIRONMENT: Lives with: lives with their spouse Lives in: House/apartment Stairs: Yes: Internal: 4 front porch steps; can reach both and External: 8 basement steps; can reach both Has following equipment at home: Single point cane and Walker - 4 wheeled  OCCUPATION: Retired   PLOF: Independent  PATIENT GOALS: decrease pain, walking, be able to perform ADL's with less difficulty.  NEXT MD VISIT: None scheduled  OBJECTIVE:  Note: Objective measures were completed at Evaluation unless otherwise noted.  COGNITION: Overall cognitive status: WFL     SENSATION: WFL  POSTURE: rounded shoulders  PALPATION: TTP of pes anserine region of left knee.  LOWER EXTREMITY ROM:  The patient was assessed in seated position  due to precautionary measures related to a history of vertigo.   Active ROM Right eval Left eval  Hip flexion    Hip extension    Hip abduction    Hip adduction    Hip internal rotation    Hip external rotation    Knee flexion    Knee extension 11 degrees from neutral 26 degrees from neutral  Ankle dorsiflexion PROM: WFL with pain at end range PROM: [facility] with pain  Ankle plantarflexion PROM: WFL with pain at end range PROM: [facility] with pain at end range  Ankle inversion    Ankle  eversion     (Blank rows = not tested)  LOWER EXTREMITY MMT:  MMT Right eval Left eval  Hip flexion 3- 3-  Hip extension    Hip abduction 3+ 3+  Hip adduction    Hip internal rotation    Hip external rotation    Knee flexion 4 4  Knee extension 3+ 3+   Ankle dorsiflexion 3+ 3+  Ankle plantarflexion    Ankle inversion    Ankle eversion     (Blank rows = not tested)  FUNCTIONAL TESTS:  5 times sit to stand: 29.2 seconds Timed up and go (TUG): 18.33 seconds  GAIT: Distance walked: 82 Assistive device utilized: None Level of assistance: Complete Independence Comments: Patient displayed a cautious gait. Slow, deliberate steps, short stride length, and feet kept close to the ground.                                                                                                                                 TREATMENT DATE:    04/16/24                         EXERCISE LOG  Exercise Repetitions and Resistance Comments  Nustep Lvl 3 x 15 mins   Rockerboard 4 mins   Heel/Toe Raises 25 reps    LAQs 2# x 25 reps bil   Seated Marches 2# x 25 reps bil   Seated Hip Abduction Red x 3.5 mins   Seated Hip Adduction 3.5 mins   Seated Ham Curls Red x 25 reps bil   STS 10 reps BUE support    Blank cell = exercise not performed today   03/11/2024                                    EXERCISE LOG  Exercise Repetitions and Resistance Comments  Seated Hip Abduction   5 reps    Seated Marches   5 reps    Toe Raises   5 reps    Heel Raises   5 reps         Blank cell = exercise not performed today   PATIENT EDUCATION:  Education details: Patient educated on HEP, objective findings,  POC, prognosis and goals for therapy.  Person educated: Patient Education method: Medical illustrator Education comprehension: verbalized understanding and returned demonstration  HOME EXERCISE PROGRAM: [id number]  ASSESSMENT:  CLINICAL IMPRESSION: Pt arrives for today's treatment  session denying any pain.  This is pt's first appointment [place].  Pt able to tolerate increased reps or time with all exercises performed today.  Pt requiring min cues for proper technique and posture.  Pt reported increased fatigue at completion of today's treatment session.   OBJECTIVE IMPAIRMENTS: decreased activity tolerance, decreased balance, decreased cognition, decreased endurance, decreased mobility, difficulty walking, decreased strength, obesity, and pain.   ACTIVITY LIMITATIONS: carrying, lifting, bending, sitting, standing, squatting, stairs, transfers, bed mobility, and dressing  PARTICIPATION LIMITATIONS: cleaning, laundry, community activity, and yard work  PERSONAL FACTORS: Age and 3+ comorbidities: Mild cognitive impairment, type 2 diabetes mellitus, moderate obesity, hyperglycemia, hypertension, vertigo per patient report are also affecting patient's functional outcome.   REHAB POTENTIAL: Good  CLINICAL DECISION MAKING: Evolving/moderate complexity  EVALUATION COMPLEXITY: Moderate   GOALS: Goals reviewed with patient? No  SHORT TERM GOALS: Target date: 05/10/2024 The patient will be independent with their HEP. Baseline: Goal status: INITIAL  2.  Patient's 5xSTS score will decrease from 29.2 sec to 20 sec to support safer and more efficient performance  ADL's.  Baseline:  Goal status: INITIAL  3.  Patients knee flexion and extension MMT will increase to at least 4 in order to help with walking.  Baseline:  Goal status: INITIAL  4. Patient's TUG score will decrease from 18.33 sec to 14.93 sec to reduce fall risk.  Baseline:   Goal status: INITIAL   LONG TERM GOALS: Target date: 05/28/2024  Patient's pain at its worst will decrease from 3.5 to 1.5 which will enable greater participation in community ambulation.   Baseline:  Goal status: INITIAL  2.    Patient's TUG score will decrease from 14.93 sec to  11.53 sec to reduce fall risk. Baseline:  Goal status: INITIAL  3.  Patient's 5xSTS score will decrease from 20 sec to 12 ensure greater confidence in performance of ADL's.  Baseline:  Goal status: INITIAL  4.  Patient will report being able to walk for at least 5 minutes on uneven surfaces to ensure greater confidence in balance.  Baseline:  Goal status: INITIAL  PLAN:  PT FREQUENCY: 2x/week  PT DURATION: other: 6 weeks. Patient leaves on 03/16/24 for a month  PLANNED INTERVENTIONS: 97164- PT Re-evaluation, 97110-Therapeutic exercises, 97530- Therapeutic activity, 97112- Neuromuscular re-education, 97535- Self Care, 86578- Manual therapy, [phone]- Gait training, [phone]- Electrical stimulation (unattended), 97016- Vasopneumatic device, Patient/Family education, Balance training, Stair training, Taping, Joint mobilization, Vestibular training, Cryotherapy, and Moist heat  PLAN FOR NEXT SESSION: Nustep, AAROM, balance training, LE muscle strengthening exercises, modalities as needed.    Deryl Flora, PTA 04/16/2024, 10:25 AM

## 2024-04-19 ENCOUNTER — Encounter

## 2024-04-22 ENCOUNTER — Ambulatory Visit

## 2024-04-22 DIAGNOSIS — R2681 Unsteadiness on feet: Secondary | ICD-10-CM

## 2024-04-22 DIAGNOSIS — M6281 Muscle weakness (generalized): Secondary | ICD-10-CM

## 2024-04-22 NOTE — Therapy (Signed)
 OUTPATIENT PHYSICAL THERAPY LOWER EXTREMITY TREATMENT   Patient Name: Clifford Gibson MRN: [id number] DOB:[birth year], 78 y.o., male Today's Date: 04/22/2024  END OF SESSION:  PT End of Session - 04/22/24 1021     Visit Number 4    Number of Visits 12    Date for PT Re-Evaluation 05/28/24    Authorization Time Period 6//30/25    Authorization - Number of Visits 10    PT Start Time 1018    PT Stop Time 1100    PT Time Calculation (min) 42 min    Activity Tolerance Patient tolerated treatment well    Behavior During Therapy [facility] for tasks assessed/performed             Past Medical History:  Diagnosis Date   Acute serous otitis media 02/22/2010   Arthritis    Diabetes mellitus without complication (HCC)    ERECTILE DYSFUNCTION 04/25/2009   GERD 03/24/2009   HYPERLIPIDEMIA 03/28/2010   HYPERTENSION 03/24/2009   OBESITY, MODERATE 04/25/2009   PONV (postoperative nausea and vomiting)    Past Surgical History:  Procedure Laterality Date   COLONOSCOPY N/A 06/21/2015   Procedure: COLONOSCOPY;  Surgeon: Ruby Corporal, MD;  [facility];  Service: Endoscopy;  Laterality: N/A;  930 - moved to 7/20 @ 7:30   NASAL SEPTUM SURGERY  1974   TONSILLECTOMY     3 yrs. old   TOTAL KNEE ARTHROPLASTY  06/11/2012   Procedure: TOTAL KNEE ARTHROPLASTY;  Surgeon: Loel Ring, MD;  Location: [facility];  Service: Orthopedics;  Laterality: Right;   Patient Active Problem List   Diagnosis Date Noted   Mild cognitive impairment 06/14/2022   Rhinorrhea 06/14/2022   Type 2 diabetes mellitus, controlled (HCC) 07/04/2015   Hyperglycemia 02/02/2015   Allergic rhinitis 05/14/2013   Hyperlipemia 03/28/2010   ACUTE SEROUS OTITIS MEDIA 02/22/2010   OBESITY, MODERATE 04/25/2009   ERECTILE DYSFUNCTION 04/25/2009   Essential hypertension 03/24/2009   GERD 03/24/2009    PCP: Marquetta Sit, MD  REFERRING PROVIDER: Marquetta Sit, MD  REFERRING DIAG: [phone] (ICD-10-CM) - At risk  for falls  THERAPY DIAG:  Unsteadiness on feet  Muscle weakness (generalized)  Rationale for Evaluation and Treatment: Rehabilitation  ONSET DATE: 2 months ago   SUBJECTIVE:   SUBJECTIVE STATEMENT: Pt denies any pain today.  Pt reports feeling well today.  PERTINENT HISTORY: Mild cognitive impairment, type 2 diabetes mellitus, moderate obesity, hyperglycemia, hypertension, vertigo per patient report  PAIN:  Are you having pain? No  PRECAUTIONS: Fall  RED FLAGS: None   WEIGHT BEARING RESTRICTIONS: No  FALLS:  Has patient fallen in last 6 months? No but indicated that he has a decrease in level of activity because of a fear of falling.   LIVING ENVIRONMENT: Lives with: lives with their spouse Lives in: House/apartment Stairs: Yes: Internal: 4 front porch steps; can reach both and External: 8 basement steps; can reach both Has following equipment at home: Single point cane and Walker - 4 wheeled  OCCUPATION: Retired   PLOF: Independent  PATIENT GOALS: decrease pain, walking, be able to perform ADL's with less difficulty.  NEXT MD VISIT: None scheduled  OBJECTIVE:  Note: Objective measures were completed at Evaluation unless otherwise noted.  COGNITION: Overall cognitive status: WFL     SENSATION: WFL  POSTURE: rounded shoulders  PALPATION: TTP of pes anserine region of left knee.  LOWER EXTREMITY ROM:  The patient was assessed in seated position due to precautionary measures  related to a history of vertigo.   Active ROM Right eval Left eval  Hip flexion    Hip extension    Hip abduction    Hip adduction    Hip internal rotation    Hip external rotation    Knee flexion    Knee extension 11 degrees from neutral 26 degrees from neutral  Ankle dorsiflexion PROM: WFL with pain at end range PROM: [facility] with pain  Ankle plantarflexion PROM: WFL with pain at end range PROM: [facility] with pain at end range  Ankle inversion    Ankle eversion     (Blank rows =  not tested)  LOWER EXTREMITY MMT:  MMT Right eval Left eval  Hip flexion 3- 3-  Hip extension    Hip abduction 3+ 3+  Hip adduction    Hip internal rotation    Hip external rotation    Knee flexion 4 4  Knee extension 3+ 3+   Ankle dorsiflexion 3+ 3+  Ankle plantarflexion    Ankle inversion    Ankle eversion     (Blank rows = not tested)  FUNCTIONAL TESTS:  5 times sit to stand: 29.2 seconds Timed up and go (TUG): 18.33 seconds  GAIT: Distance walked: 82 Assistive device utilized: None Level of assistance: Complete Independence Comments: Patient displayed a cautious gait. Slow, deliberate steps, short stride length, and feet kept close to the ground.                                                                                                                                 TREATMENT DATE:    04/22/24                         EXERCISE LOG  Exercise Repetitions and Resistance Comments  Nustep Lvl 4 x 20 mins   Rockerboard 4 mins   Standing Marches Airex x 3 mins   Side-stepping Airex x 3 mins   Heel/Toe Raises 25 reps    LAQs 2# 2 x 15 reps bil   Seated Marches 2# 2 x 15 reps bil   Seated Hip Abduction Green x 3 mins   Seated Hip Adduction    Seated Ham Curls Red x 25 reps bil   STS     Blank cell = exercise not performed today   03/11/2024                                    EXERCISE LOG  Exercise Repetitions and Resistance Comments  Seated Hip Abduction   5 reps    Seated Marches   5 reps    Toe Raises   5 reps    Heel Raises   5 reps         Blank cell = exercise not performed today   PATIENT EDUCATION:  Education details: Patient educated on HEP, objective findings, POC, prognosis and goals for therapy.  Person educated: Patient Education method: Medical illustrator Education comprehension: verbalized understanding and returned demonstration  HOME EXERCISE PROGRAM: [id number]  ASSESSMENT:  CLINICAL IMPRESSION: Pt arrives for today's  treatment session denying any pain.  Pt introduced to side-stepping and tandem gait on Airex balance beam today with pt requiring BUE support for safety and stability.  Pt able to tolerate increased reps with seated exercises today as well.  Pt denies any pain at completion of today's treatment session, but does report increased fatigue.    OBJECTIVE IMPAIRMENTS: decreased activity tolerance, decreased balance, decreased cognition, decreased endurance, decreased mobility, difficulty walking, decreased strength, obesity, and pain.   ACTIVITY LIMITATIONS: carrying, lifting, bending, sitting, standing, squatting, stairs, transfers, bed mobility, and dressing  PARTICIPATION LIMITATIONS: cleaning, laundry, community activity, and yard work  PERSONAL FACTORS: Age and 3+ comorbidities: Mild cognitive impairment, type 2 diabetes mellitus, moderate obesity, hyperglycemia, hypertension, vertigo per patient report are also affecting patient's functional outcome.   REHAB POTENTIAL: Good  CLINICAL DECISION MAKING: Evolving/moderate complexity  EVALUATION COMPLEXITY: Moderate   GOALS: Goals reviewed with patient? No  SHORT TERM GOALS: Target date: 05/10/2024 The patient will be independent with their HEP. Baseline: Goal status: INITIAL  2.  Patient's 5xSTS score will decrease from 29.2 sec to 20 sec to support safer and more efficient performance  ADL's.  Baseline:  Goal status: INITIAL  3.  Patients knee flexion and extension MMT will increase to at least 4 in order to help with walking.  Baseline:  Goal status: INITIAL  4. Patient's TUG score will decrease from 18.33 sec to 14.93 sec to reduce fall risk.  Baseline:   Goal status: INITIAL   LONG TERM GOALS: Target date: 05/28/2024  Patient's pain at its worst will decrease from 3.5 to 1.5 which will enable greater participation in community ambulation.   Baseline:  Goal status: INITIAL  2.    Patient's TUG score will decrease from  14.93 sec to 11.53 sec to reduce fall risk. Baseline:  Goal status: INITIAL  3.  Patient's 5xSTS score will decrease from 20 sec to 12 ensure greater confidence in performance of ADL's.  Baseline:  Goal status: INITIAL  4.  Patient will report being able to walk for at least 5 minutes on uneven surfaces to ensure greater confidence in balance.  Baseline:  Goal status: INITIAL  PLAN:  PT FREQUENCY: 2x/week  PT DURATION: other: 6 weeks. Patient leaves on 03/16/24 for a month  PLANNED INTERVENTIONS: 97164- PT Re-evaluation, 97110-Therapeutic exercises, 97530- Therapeutic activity, 97112- Neuromuscular re-education, 97535- Self Care, [id number]- Manual therapy, [phone]- Gait training, [phone]- Electrical stimulation (unattended), 97016- Vasopneumatic device, Patient/Family education, Balance training, Stair training, Taping, Joint mobilization, Vestibular training, Cryotherapy, and Moist heat  PLAN FOR NEXT SESSION: Nustep, AAROM, balance training, LE muscle strengthening exercises, modalities as needed.    Deryl Flora, PTA 04/22/2024, 11:32 AM

## 2024-04-27 ENCOUNTER — Encounter

## 2024-04-28 ENCOUNTER — Encounter

## 2024-04-29 ENCOUNTER — Ambulatory Visit

## 2024-04-29 DIAGNOSIS — R2681 Unsteadiness on feet: Secondary | ICD-10-CM

## 2024-04-29 DIAGNOSIS — M6281 Muscle weakness (generalized): Secondary | ICD-10-CM | POA: Diagnosis not present

## 2024-04-29 NOTE — Therapy (Signed)
 OUTPATIENT PHYSICAL THERAPY LOWER EXTREMITY TREATMENT   Patient Name: Clifford Gibson MRN: [id number] DOB:[birth year], 78 y.o., male Today's Date: 04/29/2024  END OF SESSION:  PT End of Session - 04/29/24 1436     Visit Number 5    Number of Visits 12    Date for PT Re-Evaluation 05/28/24    Authorization Time Period 6//30/25    Authorization - Number of Visits 10    PT Start Time 1433    PT Stop Time 1515    PT Time Calculation (min) 42 min    Activity Tolerance Patient tolerated treatment well    Behavior During Therapy [facility] for tasks assessed/performed             Past Medical History:  Diagnosis Date   Acute serous otitis media 02/22/2010   Arthritis    Diabetes mellitus without complication (HCC)    ERECTILE DYSFUNCTION 04/25/2009   GERD 03/24/2009   HYPERLIPIDEMIA 03/28/2010   HYPERTENSION 03/24/2009   OBESITY, MODERATE 04/25/2009   PONV (postoperative nausea and vomiting)    Past Surgical History:  Procedure Laterality Date   COLONOSCOPY N/A 06/21/2015   Procedure: COLONOSCOPY;  Surgeon: Ruby Corporal, MD;  [facility];  Service: Endoscopy;  Laterality: N/A;  930 - moved to 7/20 @ 7:30   NASAL SEPTUM SURGERY  1974   TONSILLECTOMY     3 yrs. old   TOTAL KNEE ARTHROPLASTY  06/11/2012   Procedure: TOTAL KNEE ARTHROPLASTY;  Surgeon: Loel Ring, MD;  Location: [facility];  Service: Orthopedics;  Laterality: Right;   Patient Active Problem List   Diagnosis Date Noted   Mild cognitive impairment 06/14/2022   Rhinorrhea 06/14/2022   Type 2 diabetes mellitus, controlled (HCC) 07/04/2015   Hyperglycemia 02/02/2015   Allergic rhinitis 05/14/2013   Hyperlipemia 03/28/2010   ACUTE SEROUS OTITIS MEDIA 02/22/2010   OBESITY, MODERATE 04/25/2009   ERECTILE DYSFUNCTION 04/25/2009   Essential hypertension 03/24/2009   GERD 03/24/2009    PCP: Marquetta Sit, MD  REFERRING PROVIDER: Marquetta Sit, MD  REFERRING DIAG: [phone] (ICD-10-CM) - At risk  for falls  THERAPY DIAG:  Unsteadiness on feet  Muscle weakness (generalized)  Rationale for Evaluation and Treatment: Rehabilitation  ONSET DATE: 2 months ago   SUBJECTIVE:   SUBJECTIVE STATEMENT: Pt denies any pain today.  Pt reports feeling well today.  PERTINENT HISTORY: Mild cognitive impairment, type 2 diabetes mellitus, moderate obesity, hyperglycemia, hypertension, vertigo per patient report  PAIN:  Are you having pain? No  PRECAUTIONS: Fall  RED FLAGS: None   WEIGHT BEARING RESTRICTIONS: No  FALLS:  Has patient fallen in last 6 months? No but indicated that he has a decrease in level of activity because of a fear of falling.   LIVING ENVIRONMENT: Lives with: lives with their spouse Lives in: House/apartment Stairs: Yes: Internal: 4 front porch steps; can reach both and External: 8 basement steps; can reach both Has following equipment at home: Single point cane and Walker - 4 wheeled  OCCUPATION: Retired   PLOF: Independent  PATIENT GOALS: decrease pain, walking, be able to perform ADL's with less difficulty.  NEXT MD VISIT: None scheduled  OBJECTIVE:  Note: Objective measures were completed at Evaluation unless otherwise noted.  COGNITION: Overall cognitive status: WFL     SENSATION: WFL  POSTURE: rounded shoulders  PALPATION: TTP of pes anserine region of left knee.  LOWER EXTREMITY ROM:  The patient was assessed in seated position due to precautionary measures  related to a history of vertigo.   Active ROM Right eval Left eval  Hip flexion    Hip extension    Hip abduction    Hip adduction    Hip internal rotation    Hip external rotation    Knee flexion    Knee extension 11 degrees from neutral 26 degrees from neutral  Ankle dorsiflexion PROM: WFL with pain at end range PROM: [facility] with pain  Ankle plantarflexion PROM: WFL with pain at end range PROM: [facility] with pain at end range  Ankle inversion    Ankle eversion     (Blank rows =  not tested)  LOWER EXTREMITY MMT:  MMT Right eval Left eval  Hip flexion 3- 3-  Hip extension    Hip abduction 3+ 3+  Hip adduction    Hip internal rotation    Hip external rotation    Knee flexion 4 4  Knee extension 3+ 3+   Ankle dorsiflexion 3+ 3+  Ankle plantarflexion    Ankle inversion    Ankle eversion     (Blank rows = not tested)  FUNCTIONAL TESTS:  5 times sit to stand: 29.2 seconds Timed up and go (TUG): 18.33 seconds  GAIT: Distance walked: 82 Assistive device utilized: None Level of assistance: Complete Independence Comments: Patient displayed a cautious gait. Slow, deliberate steps, short stride length, and feet kept close to the ground.                                                                                                                                 TREATMENT DATE:    04/29/24                         EXERCISE LOG  Exercise Repetitions and Resistance Comments  Nustep Lvl 4 x 20 mins   Rockerboard    Standing Marches    Side-stepping    Heel/Toe Raises 25 reps    LAQs 3# x 25 reps bil   Seated Marches 3# x 25 reps bil   Seated Hip Abduction Green x 3 mins   Seated Hip Adduction    Seated Ham Curls Green x 30 reps bil   STS     Blank cell = exercise not performed today   03/11/2024                                    EXERCISE LOG  Exercise Repetitions and Resistance Comments  Seated Hip Abduction   5 reps    Seated Marches   5 reps    Toe Raises   5 reps    Heel Raises   5 reps         Blank cell = exercise not performed today   PATIENT EDUCATION:  Education details: Patient educated on HEP, objective findings,  POC, prognosis and goals for therapy.  Person educated: Patient Education method: Medical illustrator Education comprehension: verbalized understanding and returned demonstration  HOME EXERCISE PROGRAM: [id number]  ASSESSMENT:  CLINICAL IMPRESSION: Pt arrives for today's treatment session denying any pain.   Pt able to perform 5 STS test in 17.7 seconds and TUG in 13.62 seconds, meeting both of his STGs and making good progress towards his long term goals.  Pt able to demonstrate 4+/5 bil knee strength today, meeting his short term strength goal as well.  Pt able to tolerate increased resistance or reps/time with all previously performed exercises today.  Pt has met all of his STGs at this time.  Pt denied any pain at completion of today's treatment session.    OBJECTIVE IMPAIRMENTS: decreased activity tolerance, decreased balance, decreased cognition, decreased endurance, decreased mobility, difficulty walking, decreased strength, obesity, and pain.   ACTIVITY LIMITATIONS: carrying, lifting, bending, sitting, standing, squatting, stairs, transfers, bed mobility, and dressing  PARTICIPATION LIMITATIONS: cleaning, laundry, community activity, and yard work  PERSONAL FACTORS: Age and 3+ comorbidities: Mild cognitive impairment, type 2 diabetes mellitus, moderate obesity, hyperglycemia, hypertension, vertigo per patient report are also affecting patient's functional outcome.   REHAB POTENTIAL: Good  CLINICAL DECISION MAKING: Evolving/moderate complexity  EVALUATION COMPLEXITY: Moderate   GOALS: Goals reviewed with patient? No  SHORT TERM GOALS: Target date: 05/10/2024 The patient will be independent with their HEP. Baseline: Goal status: MET  2.  Patient's 5xSTS score will decrease from 29.2 sec to 20 sec to support safer and more efficient performance  ADL's.  Baseline: 5/29: 17.7 seconds Goal status: MET  3.  Patients knee flexion and extension MMT will increase to at least 4 in order to help with walking.  Baseline: 5/29: 4+/5 bil Goal status: MET  4. Patient's TUG score will decrease from 18.33 sec to 14.93 sec to reduce fall risk.  Baseline: 5/29: 13.62 seconds  Goal status: MET   LONG TERM GOALS: Target date: 05/28/2024  Patient's pain at its worst will decrease from 3.5 to 1.5  which will enable greater participation in community ambulation.   Baseline:  Goal status: IN PROGRESS  2.    Patient's TUG score will decrease from 14.93 sec to 11.53 sec to reduce fall risk. Baseline:  Goal status: IN PROGRESS  3.  Patient's 5xSTS score will decrease from 20 sec to 12 ensure greater confidence in performance of ADL's.  Baseline:  Goal status: IN PROGRESS  4.  Patient will report being able to walk for at least 5 minutes on uneven surfaces to ensure greater confidence in balance.  Baseline:  Goal status: IN PROGRESS  PLAN:  PT FREQUENCY: 2x/week  PT DURATION: other: 6 weeks. Patient leaves on 03/16/24 for a month  PLANNED INTERVENTIONS: 97164- PT Re-evaluation, 97110-Therapeutic exercises, 97530- Therapeutic activity, 97112- Neuromuscular re-education, 97535- Self Care, 29562- Manual therapy, [phone]- Gait training, [phone]- Electrical stimulation (unattended), 97016- Vasopneumatic device, Patient/Family education, Balance training, Stair training, Taping, Joint mobilization, Vestibular training, Cryotherapy, and Moist heat  PLAN FOR NEXT SESSION: Nustep, AAROM, balance training, LE muscle strengthening exercises, modalities as needed.    Deryl Flora, PTA 04/29/2024, 3:58 PM

## 2024-05-03 ENCOUNTER — Ambulatory Visit: Attending: Family Medicine

## 2024-05-03 DIAGNOSIS — R2681 Unsteadiness on feet: Secondary | ICD-10-CM | POA: Diagnosis not present

## 2024-05-03 DIAGNOSIS — M6281 Muscle weakness (generalized): Secondary | ICD-10-CM | POA: Diagnosis not present

## 2024-05-03 NOTE — Therapy (Signed)
 OUTPATIENT PHYSICAL THERAPY LOWER EXTREMITY TREATMENT   Patient Name: Clifford JOUNG MRN: [id number] DOB:Dec 13, 1945, 78 y.o., male Today's Date: 05/03/2024  END OF SESSION:  PT End of Session - 05/03/24 1349     Visit Number 6    Number of Visits 12    Date for PT Re-Evaluation 05/28/24    Authorization Time Period 6//30/25    Authorization - Number of Visits 10    PT Start Time 1345    PT Stop Time 1426    PT Time Calculation (min) 41 min    Activity Tolerance Patient tolerated treatment well    Behavior During Therapy [facility] for tasks assessed/performed             Past Medical History:  Diagnosis Date   Acute serous otitis media 02/22/2010   Arthritis    Diabetes mellitus without complication (HCC)    ERECTILE DYSFUNCTION 04/25/2009   GERD 03/24/2009   HYPERLIPIDEMIA 03/28/2010   HYPERTENSION 03/24/2009   OBESITY, MODERATE 04/25/2009   PONV (postoperative nausea and vomiting)    Past Surgical History:  Procedure Laterality Date   COLONOSCOPY N/A 06/21/2015   Procedure: COLONOSCOPY;  Surgeon: Ruby Corporal, MD;  [facility];  Service: Endoscopy;  Laterality: N/A;  930 - moved to 7/20 @ 7:30   NASAL SEPTUM SURGERY  1974   TONSILLECTOMY     3 yrs. old   TOTAL KNEE ARTHROPLASTY  06/11/2012   Procedure: TOTAL KNEE ARTHROPLASTY;  Surgeon: Loel Ring, MD;  Location: [facility];  Service: Orthopedics;  Laterality: Right;   Patient Active Problem List   Diagnosis Date Noted   Mild cognitive impairment 06/14/2022   Rhinorrhea 06/14/2022   Type 2 diabetes mellitus, controlled (HCC) 07/04/2015   Hyperglycemia 02/02/2015   Allergic rhinitis 05/14/2013   Hyperlipemia 03/28/2010   ACUTE SEROUS OTITIS MEDIA 02/22/2010   OBESITY, MODERATE 04/25/2009   ERECTILE DYSFUNCTION 04/25/2009   Essential hypertension 03/24/2009   GERD 03/24/2009    PCP: Marquetta Sit, MD  REFERRING PROVIDER: Marquetta Sit, MD  REFERRING DIAG: [phone] (ICD-10-CM) - At risk  for falls  THERAPY DIAG:  Unsteadiness on feet  Muscle weakness (generalized)  Rationale for Evaluation and Treatment: Rehabilitation  ONSET DATE: 2 months ago   SUBJECTIVE:   SUBJECTIVE STATEMENT: Pt denies any pain today.  Pt reports feeling well today.  PERTINENT HISTORY: Mild cognitive impairment, type 2 diabetes mellitus, moderate obesity, hyperglycemia, hypertension, vertigo per patient report  PAIN:  Are you having pain? No  PRECAUTIONS: Fall  RED FLAGS: None   WEIGHT BEARING RESTRICTIONS: No  FALLS:  Has patient fallen in last 6 months? No but indicated that he has a decrease in level of activity because of a fear of falling.   LIVING ENVIRONMENT: Lives with: lives with their spouse Lives in: House/apartment Stairs: Yes: Internal: 4 front porch steps; can reach both and External: 8 basement steps; can reach both Has following equipment at home: Single point cane and Walker - 4 wheeled  OCCUPATION: Retired   PLOF: Independent  PATIENT GOALS: decrease pain, walking, be able to perform ADL's with less difficulty.  NEXT MD VISIT: None scheduled  OBJECTIVE:  Note: Objective measures were completed at Evaluation unless otherwise noted.  COGNITION: Overall cognitive status: WFL     SENSATION: WFL  POSTURE: rounded shoulders  PALPATION: TTP of pes anserine region of left knee.  LOWER EXTREMITY ROM:  The patient was assessed in seated position due to precautionary measures  related to a history of vertigo.   Active ROM Right eval Left eval  Hip flexion    Hip extension    Hip abduction    Hip adduction    Hip internal rotation    Hip external rotation    Knee flexion    Knee extension 11 degrees from neutral 26 degrees from neutral  Ankle dorsiflexion PROM: WFL with pain at end range PROM: [facility] with pain  Ankle plantarflexion PROM: WFL with pain at end range PROM: [facility] with pain at end range  Ankle inversion    Ankle eversion     (Blank rows =  not tested)  LOWER EXTREMITY MMT:  MMT Right eval Left eval  Hip flexion 3- 3-  Hip extension    Hip abduction 3+ 3+  Hip adduction    Hip internal rotation    Hip external rotation    Knee flexion 4 4  Knee extension 3+ 3+   Ankle dorsiflexion 3+ 3+  Ankle plantarflexion    Ankle inversion    Ankle eversion     (Blank rows = not tested)  FUNCTIONAL TESTS:  5 times sit to stand: 29.2 seconds Timed up and go (TUG): 18.33 seconds  GAIT: Distance walked: 82 Assistive device utilized: None Level of assistance: Complete Independence Comments: Patient displayed a cautious gait. Slow, deliberate steps, short stride length, and feet kept close to the ground.                                                                                                                                 TREATMENT DATE:    05/03/24                                  EXERCISE LOG  Exercise Repetitions and Resistance Comments  Nustep  Lvl 4 x 20 mins    Standing Marches    Standing Hip Abduction    Side-Stepping    Heel/Toe Raises 30 reps each    LAQs 3# 2 sets of 15 reps    Seated Marches 3# 2 sets of 15 reps   Seated Hip Abduction Green x 30 reps   Seated Ham Curls Green x 30 reps bil   STS 15 reps    Blank cell = exercise not performed today    04/29/24                         EXERCISE LOG  Exercise Repetitions and Resistance Comments  Nustep Lvl 4 x 20 mins   Rockerboard    Standing Marches    Side-stepping    Heel/Toe Raises 25 reps    LAQs 3# x 25 reps bil   Seated Marches 3# x 25 reps bil   Seated Hip Abduction Green x 3 mins   Seated Hip Adduction  Seated Ham Curls Green x 30 reps bil   STS     Blank cell = exercise not performed today   03/11/2024                                    EXERCISE LOG  Exercise Repetitions and Resistance Comments  Seated Hip Abduction   5 reps    Seated Marches   5 reps    Toe Raises   5 reps    Heel Raises   5 reps         Blank cell =  exercise not performed today   PATIENT EDUCATION:  Education details: Patient educated on HEP, objective findings, POC, prognosis and goals for therapy.  Person educated: Patient Education method: Medical illustrator Education comprehension: verbalized understanding and returned demonstration  HOME EXERCISE PROGRAM: [id number]  ASSESSMENT:  CLINICAL IMPRESSION: Pt arrives for today's treatment session denying any pain, but reports he did some power washing at home prior to coming today's treatment session so he is already fatigued.  Discussed with the pt the importance of not over-doing it on days that he has therapy appointments.  Today's session concentrating on seated exercises due to pt's increased fatigue.  Pt able to tolerate increased reps with various seated exercises today.  Pt denies any pain at completion of today's treatment session, but does endorse fatigue.   OBJECTIVE IMPAIRMENTS: decreased activity tolerance, decreased balance, decreased cognition, decreased endurance, decreased mobility, difficulty walking, decreased strength, obesity, and pain.   ACTIVITY LIMITATIONS: carrying, lifting, bending, sitting, standing, squatting, stairs, transfers, bed mobility, and dressing  PARTICIPATION LIMITATIONS: cleaning, laundry, community activity, and yard work  PERSONAL FACTORS: Age and 3+ comorbidities: Mild cognitive impairment, type 2 diabetes mellitus, moderate obesity, hyperglycemia, hypertension, vertigo per patient report are also affecting patient's functional outcome.   REHAB POTENTIAL: Good  CLINICAL DECISION MAKING: Evolving/moderate complexity  EVALUATION COMPLEXITY: Moderate   GOALS: Goals reviewed with patient? No  SHORT TERM GOALS: Target date: 05/10/2024 The patient will be independent with their HEP. Baseline: Goal status: MET  2.  Patient's 5xSTS score will decrease from 29.2 sec to 20 sec to support safer and more efficient performance  ADL's.   Baseline: 5/29: 17.7 seconds Goal status: MET  3.  Patients knee flexion and extension MMT will increase to at least 4 in order to help with walking.  Baseline: 5/29: 4+/5 bil Goal status: MET  4. Patient's TUG score will decrease from 18.33 sec to 14.93 sec to reduce fall risk.  Baseline: 5/29: 13.62 seconds  Goal status: MET   LONG TERM GOALS: Target date: 05/28/2024  Patient's pain at its worst will decrease from 3.5 to 1.5 which will enable greater participation in community ambulation.   Baseline:  Goal status: IN PROGRESS  2.    Patient's TUG score will decrease from 14.93 sec to 11.53 sec to reduce fall risk. Baseline:  Goal status: IN PROGRESS  3.  Patient's 5xSTS score will decrease from 20 sec to 12 ensure greater confidence in performance of ADL's.  Baseline:  Goal status: IN PROGRESS  4.  Patient will report being able to walk for at least 5 minutes on uneven surfaces to ensure greater confidence in balance.  Baseline:  Goal status: IN PROGRESS  PLAN:  PT FREQUENCY: 2x/week  PT DURATION: other: 6 weeks. Patient leaves on 03/16/24 for a month  PLANNED INTERVENTIONS: 97164- PT Re-evaluation,  97110-Therapeutic exercises, 97530- Therapeutic activity, [id number]- Neuromuscular re-education, [phone]- Self Care, 60454- Manual therapy, [phone]- Gait training, [phone]- Electrical stimulation (unattended), 97016- Vasopneumatic device, Patient/Family education, Balance training, Stair training, Taping, Joint mobilization, Vestibular training, Cryotherapy, and Moist heat  PLAN FOR NEXT SESSION: Nustep, AAROM, balance training, LE muscle strengthening exercises, modalities as needed.    Deryl Flora, PTA 05/03/2024, 2:28 PM

## 2024-05-05 ENCOUNTER — Ambulatory Visit

## 2024-05-05 DIAGNOSIS — R2681 Unsteadiness on feet: Secondary | ICD-10-CM

## 2024-05-05 DIAGNOSIS — M6281 Muscle weakness (generalized): Secondary | ICD-10-CM

## 2024-05-05 NOTE — Therapy (Signed)
 OUTPATIENT PHYSICAL THERAPY LOWER EXTREMITY TREATMENT   Patient Name: WENZEL BACKLUND MRN: [id number] DOB:10-Nov-1946, 78 y.o., male Today's Date: 05/05/2024  END OF SESSION:  PT End of Session - 05/05/24 1340     Visit Number 7    Number of Visits 12    Date for PT Re-Evaluation 05/28/24    Authorization Time Period 6//30/25    Authorization - Number of Visits 10     PT Start Time 1339    PT Stop Time 1420    PT Time Calculation (min) 41 min    Activity Tolerance Patient tolerated treatment well    Behavior During Therapy [facility] for tasks assessed/performed             Past Medical History:  Diagnosis Date   Acute serous otitis media 02/22/2010   Arthritis    Diabetes mellitus without complication (HCC)    ERECTILE DYSFUNCTION 04/25/2009   GERD 03/24/2009   HYPERLIPIDEMIA 03/28/2010   HYPERTENSION 03/24/2009   OBESITY, MODERATE 04/25/2009   PONV (postoperative nausea and vomiting)    Past Surgical History:  Procedure Laterality Date   COLONOSCOPY N/A 06/21/2015   Procedure: COLONOSCOPY;  Surgeon: Ruby Corporal, MD;  [facility];  Service: Endoscopy;  Laterality: N/A;  930 - moved to 7/20 @ 7:30   NASAL SEPTUM SURGERY  1974   TONSILLECTOMY     3 yrs. old   TOTAL KNEE ARTHROPLASTY  06/11/2012   Procedure: TOTAL KNEE ARTHROPLASTY;  Surgeon: Loel Ring, MD;  Location: [facility];  Service: Orthopedics;  Laterality: Right;   Patient Active Problem List   Diagnosis Date Noted   Mild cognitive impairment 06/14/2022   Rhinorrhea 06/14/2022   Type 2 diabetes mellitus, controlled (HCC) 07/04/2015   Hyperglycemia 02/02/2015   Allergic rhinitis 05/14/2013   Hyperlipemia 03/28/2010   ACUTE SEROUS OTITIS MEDIA 02/22/2010   OBESITY, MODERATE 04/25/2009   ERECTILE DYSFUNCTION 04/25/2009   Essential hypertension 03/24/2009   GERD 03/24/2009    PCP: Marquetta Sit, MD  REFERRING PROVIDER: Marquetta Sit, MD  REFERRING DIAG: [phone] (ICD-10-CM) - At risk  for falls  THERAPY DIAG:  Unsteadiness on feet  Muscle weakness (generalized)  Rationale for Evaluation and Treatment: Rehabilitation  ONSET DATE: 2 months ago   SUBJECTIVE:   SUBJECTIVE STATEMENT: Pt denies any pain today.  Pt reports feeling well today.  PERTINENT HISTORY: Mild cognitive impairment, type 2 diabetes mellitus, moderate obesity, hyperglycemia, hypertension, vertigo per patient report  PAIN:  Are you having pain? No  PRECAUTIONS: Fall  RED FLAGS: None   WEIGHT BEARING RESTRICTIONS: No  FALLS:  Has patient fallen in last 6 months? No but indicated that he has a decrease in level of activity because of a fear of falling.   LIVING ENVIRONMENT: Lives with: lives with their spouse Lives in: House/apartment Stairs: Yes: Internal: 4 front porch steps; can reach both and External: 8 basement steps; can reach both Has following equipment at home: Single point cane and Walker - 4 wheeled  OCCUPATION: Retired   PLOF: Independent  PATIENT GOALS: decrease pain, walking, be able to perform ADL's with less difficulty.  NEXT MD VISIT: None scheduled  OBJECTIVE:  Note: Objective measures were completed at Evaluation unless otherwise noted.  COGNITION: Overall cognitive status: WFL     SENSATION: WFL  POSTURE: rounded shoulders  PALPATION: TTP of pes anserine region of left knee.  LOWER EXTREMITY ROM:  The patient was assessed in seated position due to precautionary  measures related to a history of vertigo.   Active ROM Right eval Left eval  Hip flexion    Hip extension    Hip abduction    Hip adduction    Hip internal rotation    Hip external rotation    Knee flexion    Knee extension 11 degrees from neutral 26 degrees from neutral  Ankle dorsiflexion PROM: WFL with pain at end range PROM: [facility] with pain  Ankle plantarflexion PROM: WFL with pain at end range PROM: [facility] with pain at end range  Ankle inversion    Ankle eversion     (Blank rows =  not tested)  LOWER EXTREMITY MMT:  MMT Right eval Left eval  Hip flexion 3- 3-  Hip extension    Hip abduction 3+ 3+  Hip adduction    Hip internal rotation    Hip external rotation    Knee flexion 4 4  Knee extension 3+ 3+   Ankle dorsiflexion 3+ 3+  Ankle plantarflexion    Ankle inversion    Ankle eversion     (Blank rows = not tested)  FUNCTIONAL TESTS:  5 times sit to stand: 29.2 seconds Timed up and go (TUG): 18.33 seconds  GAIT: Distance walked: 82 Assistive device utilized: None Level of assistance: Complete Independence Comments: Patient displayed a cautious gait. Slow, deliberate steps, short stride length, and feet kept close to the ground.                                                                                                                                 TREATMENT DATE:    05/03/24                                  EXERCISE LOG  Exercise Repetitions and Resistance Comments  Nustep  Lvl 4 x 20 mins    Standing Marches    Tandem Gait Airex x 4 mins   Side-Stepping Airex x 5 mins   Heel/Toe Raises    LAQs 4# x 20 reps bil   Seated Marches 4# x 20 reps bil   Seated Hip Abduction Blue x 30 reps   Seated Ham Curls Blue x 25 reps bil   STS 15 reps    Blank cell = exercise not performed today    04/29/24                         EXERCISE LOG  Exercise Repetitions and Resistance Comments  Nustep Lvl 4 x 20 mins   Rockerboard    Standing Marches    Side-stepping    Heel/Toe Raises 25 reps    LAQs 3# x 25 reps bil   Seated Marches 3# x 25 reps bil   Seated Hip Abduction Green x 3 mins   Seated Hip Adduction  Seated Ham Curls Green x 30 reps bil   STS     Blank cell = exercise not performed today   03/11/2024                                    EXERCISE LOG  Exercise Repetitions and Resistance Comments  Seated Hip Abduction   5 reps    Seated Marches   5 reps    Toe Raises   5 reps    Heel Raises   5 reps         Blank cell = exercise  not performed today   PATIENT EDUCATION:  Education details: Patient educated on HEP, objective findings, POC, prognosis and goals for therapy.  Person educated: Patient Education method: Medical illustrator Education comprehension: verbalized understanding and returned demonstration  HOME EXERCISE PROGRAM: [id number]  ASSESSMENT:  CLINICAL IMPRESSION: Pt arrives for today's treatment session denying any pain, but does report fatigue.  Pt able to tolerate increased reps, time, or resistance with all exercises performed today.  Pt denies any pain at completion of today's treatment session.   OBJECTIVE IMPAIRMENTS: decreased activity tolerance, decreased balance, decreased cognition, decreased endurance, decreased mobility, difficulty walking, decreased strength, obesity, and pain.   ACTIVITY LIMITATIONS: carrying, lifting, bending, sitting, standing, squatting, stairs, transfers, bed mobility, and dressing  PARTICIPATION LIMITATIONS: cleaning, laundry, community activity, and yard work  PERSONAL FACTORS: Age and 3+ comorbidities: Mild cognitive impairment, type 2 diabetes mellitus, moderate obesity, hyperglycemia, hypertension, vertigo per patient report are also affecting patient's functional outcome.   REHAB POTENTIAL: Good  CLINICAL DECISION MAKING: Evolving/moderate complexity  EVALUATION COMPLEXITY: Moderate   GOALS: Goals reviewed with patient? No  SHORT TERM GOALS: Target date: 05/10/2024 The patient will be independent with their HEP. Baseline: Goal status: MET  2.  Patient's 5xSTS score will decrease from 29.2 sec to 20 sec to support safer and more efficient performance  ADL's.  Baseline: 5/29: 17.7 seconds Goal status: MET  3.  Patients knee flexion and extension MMT will increase to at least 4 in order to help with walking.  Baseline: 5/29: 4+/5 bil Goal status: MET  4. Patient's TUG score will decrease from 18.33 sec to 14.93 sec to reduce fall  risk.  Baseline: 5/29: 13.62 seconds  Goal status: MET   LONG TERM GOALS: Target date: 05/28/2024  Patient's pain at its worst will decrease from 3.5 to 1.5 which will enable greater participation in community ambulation.   Baseline:  Goal status: IN PROGRESS  2.    Patient's TUG score will decrease from 14.93 sec to 11.53 sec to reduce fall risk. Baseline:  Goal status: IN PROGRESS  3.  Patient's 5xSTS score will decrease from 20 sec to 12 ensure greater confidence in performance of ADL's.  Baseline:  Goal status: IN PROGRESS  4.  Patient will report being able to walk for at least 5 minutes on uneven surfaces to ensure greater confidence in balance.  Baseline:  Goal status: IN PROGRESS  PLAN:  PT FREQUENCY: 2x/week  PT DURATION: other: 6 weeks. Patient leaves on 03/16/24 for a month  PLANNED INTERVENTIONS: 97164- PT Re-evaluation, 97110-Therapeutic exercises, 97530- Therapeutic activity, 97112- Neuromuscular re-education, 97535- Self Care, 40981- Manual therapy, [phone]- Gait training, [phone]- Electrical stimulation (unattended), 97016- Vasopneumatic device, Patient/Family education, Balance training, Stair training, Taping, Joint mobilization, Vestibular training, Cryotherapy, and Moist heat  PLAN FOR NEXT SESSION: Nustep, AAROM, balance  training, LE muscle strengthening exercises, modalities as needed.    Deryl Flora, PTA 05/05/2024, 2:32 PM

## 2024-05-14 ENCOUNTER — Other Ambulatory Visit: Payer: Self-pay | Admitting: Family Medicine

## 2024-06-15 ENCOUNTER — Other Ambulatory Visit: Payer: Self-pay | Admitting: Family Medicine

## 2024-06-29 ENCOUNTER — Telehealth: Payer: Self-pay | Admitting: Family Medicine

## 2024-06-29 NOTE — Telephone Encounter (Signed)
 Copied from CRM [id number]. Topic: Appointments - Scheduling Inquiry for Clinic >> Jun 29, 2024  8:21 AM Clifford Gibson wrote: Reason for CRM: Patient has  covid has been going on for over a week, wheezing, dizziness, not eating, fever, and coughing. Wife called in on his behalf and did not want to speak to NT. If unable to reach at other numbers, please try alternative Phone: [phone].

## 2024-06-29 NOTE — Telephone Encounter (Signed)
 I spoke with the patient's wife and appt has been scheduled for tomorrow with PCP due to no available opening. Patient's wife advised to proceed with patient to urgent care for any dyspnea or worsening symptoms wife voiced understanding.

## 2024-06-30 ENCOUNTER — Encounter: Payer: Self-pay | Admitting: Family Medicine

## 2024-06-30 ENCOUNTER — Ambulatory Visit: Admitting: Family Medicine

## 2024-06-30 VITALS — BP 138/62 | HR 95 | Temp 97.6°F | Wt 210.7 lb

## 2024-06-30 DIAGNOSIS — R062 Wheezing: Secondary | ICD-10-CM

## 2024-06-30 DIAGNOSIS — U071 COVID-19: Secondary | ICD-10-CM

## 2024-06-30 DIAGNOSIS — E119 Type 2 diabetes mellitus without complications: Secondary | ICD-10-CM

## 2024-06-30 MED ORDER — ALBUTEROL SULFATE HFA 108 (90 BASE) MCG/ACT IN AERS
2.0000 | INHALATION_SPRAY | Freq: Four times a day (QID) | RESPIRATORY_TRACT | 0 refills | Status: AC | PRN
Start: 2024-06-30 — End: ?

## 2024-06-30 MED ORDER — PREDNISONE 20 MG PO TABS
ORAL_TABLET | ORAL | 0 refills | Status: DC
Start: 2024-06-30 — End: 2024-10-15

## 2024-06-30 MED ORDER — DOXYCYCLINE HYCLATE 100 MG PO CAPS: 100.0000 mg | ORAL_CAPSULE | Freq: Two times a day (BID) | ORAL | 0 refills | Status: DC

## 2024-06-30 NOTE — Progress Notes (Signed)
 Established Patient Office Visit  Subjective   Patient ID: Clifford Gibson, male    DOB: 1946-05-19  Age: 79 y.o. MRN: [id number]  Chief Complaint  Patient presents with   Cough   Wheezing   Shortness of Breath    HPI   Clifford Gibson is seen today with cough, wheezing, some shortness of breath for about a week and a half now.  He actually did home COVID test last Wednesday which came back positive.  His wife just had recent kidney transplant about a month ago at [facility] and is doing well.  They have tried to stay relatively isolated.  He denies any fever at this point.  O2 sats have occasionally been upper 80s after activity but generally low 90s at rest.  His usual O2 sats are upper 90 range.  He has history of hypertension, GERD, type 2 diabetes, hyperlipidemia, mild cognitive impairment.  Blood pressure treated with Diovan  HCTZ.  A1c's have been very well-controlled.  Last A1c back in February 6.2%.  Remains on metformin .  Past Medical History:  Diagnosis Date   Acute serous otitis media 02/22/2010   Arthritis    Diabetes mellitus without complication (HCC)    ERECTILE DYSFUNCTION 04/25/2009   GERD 03/24/2009   HYPERLIPIDEMIA 03/28/2010   HYPERTENSION 03/24/2009   OBESITY, MODERATE 04/25/2009   PONV (postoperative nausea and vomiting)    Past Surgical History:  Procedure Laterality Date   COLONOSCOPY N/A 06/21/2015   Procedure: COLONOSCOPY;  Surgeon: Clifford RAYMOND Rivet, MD;  [facility];  Service: Endoscopy;  Laterality: N/A;  930 - moved to 7/20 @ 7:30   NASAL SEPTUM SURGERY  1974   TONSILLECTOMY     3 yrs. old   TOTAL KNEE ARTHROPLASTY  06/11/2012   Procedure: TOTAL KNEE ARTHROPLASTY;  Surgeon: Clifford JAYSON Billing, MD;  Location: [facility];  Service: Orthopedics;  Laterality: Right;    reports that he has never smoked. He has never used smokeless tobacco. He reports that he does not drink alcohol and does not use drugs. family history includes Dementia in his father;  Scleroderma in his mother. Allergies  Allergen Reactions   Lipitor [Atorvastatin  Calcium ] Other (See Comments)   Penicillins Other (See Comments)    Reaction=bleeding,diarrhea   Statins Other (See Comments)    Myalgia     Review of Systems  Constitutional:  Negative for chills, fever, malaise/fatigue and weight loss.  Eyes:  Negative for blurred vision.  Respiratory:  Positive for cough, shortness of breath and wheezing. Negative for hemoptysis.   Cardiovascular:  Negative for chest pain.  Neurological:  Negative for dizziness, weakness and headaches.      Objective:     BP 138/62 (BP Location: Left Arm, Patient Position: Sitting, Cuff Size: Normal)   Pulse 95   Temp 97.6 F (36.4 C) (Oral)   Wt 210 lb 11.2 oz (95.6 kg)   SpO2 92%   BMI 32.04 kg/m  BP Readings from Last 3 Encounters:  06/30/24 138/62  02/27/24 126/80  01/05/24 (!) 140/78   Wt Readings from Last 3 Encounters:  06/30/24 210 lb 11.2 oz (95.6 kg)  02/27/24 213 lb 4.8 oz (96.8 kg)  01/30/24 215 lb (97.5 kg)      Physical Exam Vitals reviewed.  Constitutional:      General: He is not in acute distress.    Appearance: He is not ill-appearing.  HENT:     Mouth/Throat:     Mouth: Mucous membranes are moist.  Pharynx: Oropharynx is clear.  Cardiovascular:     Rate and Rhythm: Normal rate and regular rhythm.  Pulmonary:     Comments: O2 sat at rest room air is 92%.  No retractions.  No active wheezes noted at this time.  Has some faint crackles in both bases. Musculoskeletal:     Right lower leg: No edema.     Left lower leg: No edema.  Neurological:     Mental Status: He is alert.      No results found for any visits on 06/30/24.  Last CBC Lab Results  Component Value Date   WBC 9.3 04/25/2020   HGB 14.0 04/25/2020   HCT 41.0 04/25/2020   MCV 87.0 04/25/2020   MCH 28.5 06/14/2012   RDW 14.9 04/25/2020   PLT 290.0 04/25/2020   Last metabolic panel Lab Results  Component Value  Date   GLUCOSE 112 (H) 06/10/2023   NA 141 06/10/2023   K 4.0 06/10/2023   CL 100 06/10/2023   CO2 29 06/10/2023   BUN 17 06/10/2023   CREATININE 1.32 06/10/2023   GFR 51.67 (L) 06/10/2023   CALCIUM  9.2 06/10/2023   PROT 6.8 06/10/2023   ALBUMIN 4.3 06/10/2023   BILITOT 0.5 06/10/2023   ALKPHOS 53 06/10/2023   AST 13 06/10/2023   ALT 11 06/10/2023   Last lipids Lab Results  Component Value Date   CHOL 119 06/10/2023   HDL 46.90 06/10/2023   LDLCALC 45 06/10/2023   LDLDIRECT 172.0 04/16/2022   TRIG 133.0 06/10/2023   CHOLHDL 3 06/10/2023   Last hemoglobin A1c Lab Results  Component Value Date   HGBA1C 6.2 (A) 01/05/2024      The ASCVD Risk score (Arnett DK, et al., 2019) failed to calculate for the following reasons:   The valid total cholesterol range is 130 to 320 mg/dL    Assessment & Plan:   #1 acute respiratory illness with wheezing.  COVID positive a week ago.  No active wheezing noted at this time on exam but O2 sats 92% at rest.  May be having some intermittent wheezing.  We refilled his albuterol  inhaler to use as needed.  Prednisone  20 mg 2 tablets daily for 5 days.  We elected to go and cover with doxycycline  100 mg twice daily for 7 days given his age and multiple comorbidities.  He had some crackles in both bases but no fever. Follow-up promptly for any increased shortness of breath or fever.  Continue to monitor home O2 sats and be in touch if dropping below 90%  #2 COVID-positive 1 week ago.  He was already about 3 days into symptoms when he tested.  Past window for antiviral therapy at this time  #3 type 2 diabetes controlled with last A1c 6.2%.  He is aware prednisone  may exacerbate blood sugars and recommend close monitoring.  Recheck A1c at follow-up   No follow-ups on file.    Clifford Scarlet, MD

## 2024-06-30 NOTE — Patient Instructions (Signed)
 Monitor oxygen and be in touch if consistently < 90  Follow up for any fever or increased shortness of breath.

## 2024-07-12 ENCOUNTER — Other Ambulatory Visit: Payer: Self-pay | Admitting: Family Medicine

## 2024-07-22 ENCOUNTER — Telehealth: Payer: Self-pay | Admitting: *Deleted

## 2024-07-22 NOTE — Telephone Encounter (Signed)
 I spoke with the patient's wife and patient has been scheduled to see PCP

## 2024-07-22 NOTE — Telephone Encounter (Signed)
 Copied from CRM #[id number]. Topic: Appointments - Scheduling Inquiry for Clinic >> Jul 22, 2024 12:01 PM Turkey A wrote: Reason for CRM: Patient's wife would like a longer appointment to discuss spouse may have Parkinson's Disease. Please call wife.

## 2024-07-27 ENCOUNTER — Ambulatory Visit: Admitting: Family Medicine

## 2024-07-27 ENCOUNTER — Encounter: Payer: Self-pay | Admitting: Family Medicine

## 2024-07-27 VITALS — BP 144/80 | HR 83 | Temp 97.8°F | Wt 211.8 lb

## 2024-07-27 DIAGNOSIS — R2689 Other abnormalities of gait and mobility: Secondary | ICD-10-CM | POA: Diagnosis not present

## 2024-07-27 DIAGNOSIS — G3184 Mild cognitive impairment, so stated: Secondary | ICD-10-CM

## 2024-07-27 DIAGNOSIS — R251 Tremor, unspecified: Secondary | ICD-10-CM | POA: Diagnosis not present

## 2024-07-27 NOTE — Patient Instructions (Signed)
 I will be setting up Neurology referral.

## 2024-07-27 NOTE — Progress Notes (Signed)
 Established Patient Office Visit  Subjective   Patient ID: Clifford Gibson, male    DOB: [birth year]  Age: 78 y.o. MRN: [id number]  Chief Complaint  Patient presents with   Tremors    HPI   Kevonta is seen today accompanied by wife and daughter.  Last visit he was recuperating from COVID and feels like he has gotten over that completely at this time.  Cough has resolved.  He has chronic problems including history of hypertension, GERD, controlled type 2 diabetes, hyperlipidemia, mild cognitive impairment.  We had actually referred him to neurology couple years ago and he had MRI scan which showed some age-related atrophy but no acute findings.  B12 and other labs screening unremarkable.  We started Aricept  10 mg daily.  He saw neurology and apparently had referral to neuropsychologist but had conflicts with scheduling and never got in to see them.  Family has noticed that he seems slower in his movements and has somewhat intermittent cognitive changes.  Some days seems slower than others.  They feel like recently had more shuffling of his feet.  He does have some very mild hand tremor.  They have also noted decreased facial expression.  They specifically had concerns about possible Parkinson's disease.  No history of hallucinations or delusions.  No urine incontinence.  Family queries whether he may have some depression.  Patient denies any sadness or crying spells.  No suicidal thoughts.  Does have some decreased motivation and loss of interest in activities.  Past Medical History:  Diagnosis Date   Acute serous otitis media 02/22/2010   Arthritis    Diabetes mellitus without complication (HCC)    ERECTILE DYSFUNCTION 04/25/2009   GERD 03/24/2009   HYPERLIPIDEMIA 03/28/2010   HYPERTENSION 03/24/2009   OBESITY, MODERATE 04/25/2009   PONV (postoperative nausea and vomiting)    Past Surgical History:  Procedure Laterality Date   COLONOSCOPY N/A 06/21/2015   Procedure: COLONOSCOPY;   Surgeon: Claudis RAYMOND Rivet, MD;  [facility];  Service: Endoscopy;  Laterality: N/A;  930 - moved to 7/20 @ 7:30   NASAL SEPTUM SURGERY  1974   TONSILLECTOMY     3 yrs. old   TOTAL KNEE ARTHROPLASTY  06/11/2012   Procedure: TOTAL KNEE ARTHROPLASTY;  Surgeon: Reyes JAYSON Billing, MD;  Location: [facility];  Service: Orthopedics;  Laterality: Right;    reports that he has never smoked. He has never used smokeless tobacco. He reports that he does not drink alcohol and does not use drugs. family history includes Dementia in his father; Scleroderma in his mother. Allergies  Allergen Reactions   Lipitor [Atorvastatin  Calcium ] Other (See Comments)   Penicillins Other (See Comments)    Reaction=bleeding,diarrhea   Statins Other (See Comments)    Myalgia     Review of Systems  Constitutional:  Negative for chills and fever.  Eyes:  Negative for blurred vision.  Respiratory:  Negative for shortness of breath.   Cardiovascular:  Negative for chest pain.  Gastrointestinal:  Negative for abdominal pain.  Neurological:  Negative for dizziness, weakness and headaches.  Psychiatric/Behavioral:  Negative for hallucinations and suicidal ideas. The patient is not nervous/anxious and does not have insomnia.       Objective:     BP (!) 144/80   Pulse 83   Temp 97.8 F (36.6 C) (Oral)   Wt 211 lb 12.8 oz (96.1 kg)   SpO2 96%   BMI 32.20 kg/m  BP Readings from Last 3 Encounters:  07/27/24 (!) 144/80  06/30/24 138/62  02/27/24 126/80   Wt Readings from Last 3 Encounters:  07/27/24 211 lb 12.8 oz (96.1 kg)  06/30/24 210 lb 11.2 oz (95.6 kg)  02/27/24 213 lb 4.8 oz (96.8 kg)      Physical Exam Vitals reviewed.  Constitutional:      Appearance: He is well-developed.  HENT:     Right Ear: External ear normal.     Left Ear: External ear normal.  Eyes:     Pupils: Pupils are equal, round, and reactive to light.  Neck:     Thyroid : No thyromegaly.  Cardiovascular:     Rate and  Rhythm: Normal rate and regular rhythm.  Pulmonary:     Effort: Pulmonary effort is normal. No respiratory distress.     Breath sounds: Normal breath sounds. No wheezing or rales.  Musculoskeletal:     Cervical back: Neck supple.  Neurological:     General: No focal deficit present.     Mental Status: He is alert and oriented to person, place, and time.     Cranial Nerves: No cranial nerve deficit.     Motor: No weakness.     Comments: Does have very fine tremor both hands which is not exacerbated with intentional movement  No increased muscle rigidity  No focal weakness upper or lower extremities  Psychiatric:     Comments: PHQ-9 equals 8      No results found for any visits on 07/27/24.    The ASCVD Risk score (Arnett DK, et al., 2019) failed to calculate for the following reasons:   The valid total cholesterol range is 130 to 320 mg/dL    Assessment & Plan:   78 year old male with chronic medical problems as above who is seen with concerns by family over slower movements in general, possible slow cognitive decline, shuffling of feet, hand tremor, decreased facial expressions.  Previously saw neurology and plans were to see neuropsychologist but apparently never went.  He is currently on Aricept .  PHQ-9 equals 8.  Not clear that his loss of interest is stemming from depression  -Would recommend getting back into see neurology again for further assessment and family agrees with this plan - Would probably benefit from neuropsychology assessment and will defer to neurology   Wolm Scarlet, MD

## 2024-08-17 ENCOUNTER — Encounter: Payer: Self-pay | Admitting: Family Medicine

## 2024-08-26 DIAGNOSIS — H52203 Unspecified astigmatism, bilateral: Secondary | ICD-10-CM | POA: Diagnosis not present

## 2024-08-26 DIAGNOSIS — E119 Type 2 diabetes mellitus without complications: Secondary | ICD-10-CM | POA: Diagnosis not present

## 2024-08-26 DIAGNOSIS — H5203 Hypermetropia, bilateral: Secondary | ICD-10-CM | POA: Diagnosis not present

## 2024-08-26 DIAGNOSIS — H02831 Dermatochalasis of right upper eyelid: Secondary | ICD-10-CM | POA: Diagnosis not present

## 2024-08-26 DIAGNOSIS — H02834 Dermatochalasis of left upper eyelid: Secondary | ICD-10-CM | POA: Diagnosis not present

## 2024-08-26 LAB — HM DIABETES EYE EXAM

## 2024-09-08 ENCOUNTER — Encounter: Payer: Self-pay | Admitting: Physician Assistant

## 2024-10-12 ENCOUNTER — Other Ambulatory Visit: Payer: Self-pay | Admitting: Family Medicine

## 2024-10-14 ENCOUNTER — Ambulatory Visit: Payer: Self-pay

## 2024-10-14 NOTE — Telephone Encounter (Signed)
 FYI Only or Action Required?: FYI only for provider: appointment scheduled on 10/15/24.  Patient was last seen in primary care on 07/27/2024 by Micheal Wolm ORN, MD.  Called Nurse Triage reporting Foot Swelling.  Symptoms began chronic but worsening x 2+ days.  Interventions attempted: Other: elevating legs.  Symptoms are: gradually worsening.  Triage Disposition: See Today or Tomorrow in Office (overriding See PCP When Office is Open (Within 3 Days))  Patient/caregiver understands and will follow disposition?:   Reason for Disposition  [1] MILD swelling of both ankles (i.e., pedal edema) AND [2] new-onset or getting worse  Answer Assessment - Initial Assessment Questions Bilateral ankle and foot edema. Denies SOB. Denies recent weight gain. Wife also mentions that he is not compliant with checking his BS as often as he should be. At the end of the call, she mentioned he has also had some bouts of diarrhea and is questioning if it could be r/t the metformin .   Advised to call back with any worsening of symptoms and to seek medical attention if he develops any SOB.  1. ONSET: When did the swelling start? (e.g., minutes, hours, days)     Awhile, worsening x 2+ days ago  2. LOCATION: What part of the leg is swollen?  Are both legs swollen or just one leg?     Bilateral  3. SEVERITY: How bad is the swelling? (e.g., localized; mild, moderate, severe)     Severe  4. REDNESS: Is there redness or signs of infection?     Denies  5. PAIN:     Yes  6. FEVER: Do you have a fever? If Yes, ask: What is it, how was it measured, and when did it start?      Denies  8. MEDICAL HISTORY: Do you have a history of blood clots (e.g., DVT), cancer, heart failure, kidney disease, or liver failure?     HTN, DM  Protocols used: Leg Swelling and Edema-A-AH Copied from CRM #[id number]. Topic: Clinical - Red Word Triage >> Oct 14, 2024 12:20 PM Alfonso ORN wrote: Red Word that prompted  transfer to Nurse Triage: pt wife stated pt feet and ankles swollen.  Past Medical History:  Diagnosis Date   Acute serous otitis media 02/22/2010   Arthritis    Diabetes mellitus without complication (HCC)    ERECTILE DYSFUNCTION 04/25/2009   GERD 03/24/2009   HYPERLIPIDEMIA 03/28/2010   HYPERTENSION 03/24/2009   OBESITY, MODERATE 04/25/2009   PONV (postoperative nausea and vomiting)

## 2024-10-15 ENCOUNTER — Encounter: Payer: Self-pay | Admitting: Family Medicine

## 2024-10-15 ENCOUNTER — Ambulatory Visit: Admitting: Family Medicine

## 2024-10-15 VITALS — BP 146/78 | HR 95 | Temp 98.4°F | Wt 212.7 lb

## 2024-10-15 DIAGNOSIS — E119 Type 2 diabetes mellitus without complications: Secondary | ICD-10-CM | POA: Diagnosis not present

## 2024-10-15 DIAGNOSIS — Z7984 Long term (current) use of oral hypoglycemic drugs: Secondary | ICD-10-CM

## 2024-10-15 DIAGNOSIS — I10 Essential (primary) hypertension: Secondary | ICD-10-CM | POA: Diagnosis not present

## 2024-10-15 DIAGNOSIS — E785 Hyperlipidemia, unspecified: Secondary | ICD-10-CM | POA: Diagnosis not present

## 2024-10-15 DIAGNOSIS — R600 Localized edema: Secondary | ICD-10-CM | POA: Diagnosis not present

## 2024-10-15 LAB — COMPREHENSIVE METABOLIC PANEL WITH GFR
ALT: 9 U/L (ref 0–53)
AST: 11 U/L (ref 0–37)
Albumin: 4.3 g/dL (ref 3.5–5.2)
Alkaline Phosphatase: 52 U/L (ref 39–117)
BUN: 23 mg/dL (ref 6–23)
CO2: 30 meq/L (ref 19–32)
Calcium: 9.2 mg/dL (ref 8.4–10.5)
Chloride: 103 meq/L (ref 96–112)
Creatinine, Ser: 1.34 mg/dL (ref 0.40–1.50)
GFR: 50.27 mL/min — ABNORMAL LOW (ref >60.00)
Glucose, Bld: 117 mg/dL — ABNORMAL HIGH (ref 70–99)
Potassium: 4 meq/L (ref 3.5–5.1)
Sodium: 142 meq/L (ref 135–145)
Total Bilirubin: 0.5 mg/dL (ref 0.2–1.2)
Total Protein: 6.7 g/dL (ref 6.0–8.3)

## 2024-10-15 LAB — MICROALBUMIN / CREATININE URINE RATIO
Creatinine,U: 120 mg/dL
Microalb Creat Ratio: 33.7 mg/g — ABNORMAL HIGH (ref 0.0–30.0)
Microalb, Ur: 4 mg/dL — ABNORMAL HIGH (ref 0.0–1.9)

## 2024-10-15 LAB — LIPID PANEL
Cholesterol: 134 mg/dL (ref 0–200)
HDL: 51.3 mg/dL (ref >39.00)
LDL Cholesterol: 48 mg/dL (ref 0–99)
NonHDL: 82.59
Total CHOL/HDL Ratio: 3
Triglycerides: 174 mg/dL — ABNORMAL HIGH (ref 0.0–149.0)
VLDL: 34.8 mg/dL (ref 0.0–40.0)

## 2024-10-15 LAB — BRAIN NATRIURETIC PEPTIDE: Pro B Natriuretic peptide (BNP): 39 pg/mL (ref 0.0–100.0)

## 2024-10-15 MED ORDER — FUROSEMIDE 20 MG PO TABS: ORAL_TABLET | ORAL | 3 refills | Status: AC

## 2024-10-15 MED ORDER — METFORMIN HCL ER 750 MG PO TB24
750.0000 mg | ORAL_TABLET | Freq: Every day | ORAL | 3 refills | Status: AC
Start: 2024-10-15 — End: ?

## 2024-10-15 NOTE — Progress Notes (Signed)
 Established Patient Office Visit  Subjective   Patient ID: Clifford Gibson, male    DOB: June 15, 1946  Age: 78 y.o. MRN: [id number]  Chief Complaint  Patient presents with   Edema    HPI   Clifford Gibson is seen with some swelling feet ankles and lower legs bilaterally for the past several days.  Denies any orthopnea or dyspnea.  Very sedentary.  No known history of congestive heart failure.  Edema apparently does not go down completely at night with rest.  Medications reviewed.  He remains on Diovan  HCTZ.  No new medications.  His weight is up only a couple pounds from last visit here.  He is having some issues also with intermittent diarrhea.  They think this may be related to metformin .  Takes extended release twice daily.  No recent bloody stools.  Appetite and weight stable.  No abdominal pain.  He has type 2 diabetes which has been controlled with metformin .  Last A1c was 6.2%  Past Medical History:  Diagnosis Date   Acute serous otitis media 02/22/2010   Arthritis    Diabetes mellitus without complication (HCC)    ERECTILE DYSFUNCTION 04/25/2009   GERD 03/24/2009   HYPERLIPIDEMIA 03/28/2010   HYPERTENSION 03/24/2009   OBESITY, MODERATE 04/25/2009   PONV (postoperative nausea and vomiting)    Past Surgical History:  Procedure Laterality Date   COLONOSCOPY N/A 06/21/2015   Procedure: COLONOSCOPY;  Surgeon: Claudis RAYMOND Rivet, MD;  Location: [facility];  Service: Endoscopy;  Laterality: N/A;  930 - moved to 7/20 @ 7:30   NASAL SEPTUM SURGERY  1974   TONSILLECTOMY     3 yrs. old   TOTAL KNEE ARTHROPLASTY  06/11/2012   Procedure: TOTAL KNEE ARTHROPLASTY;  Surgeon: Reyes JAYSON Billing, MD;  Location: [facility];  Service: Orthopedics;  Laterality: Right;    reports that he has never smoked. He has never used smokeless tobacco. He reports that he does not drink alcohol and does not use drugs. family history includes Dementia in his father; Scleroderma in his mother. Allergies  Allergen  Reactions   Lipitor [Atorvastatin  Calcium ] Other (See Comments)   Penicillins Other (See Comments)    Reaction=bleeding,diarrhea   Statins Other (See Comments)    Myalgia     Review of Systems  Constitutional:  Negative for chills, fever, malaise/fatigue and weight loss.  Eyes:  Negative for blurred vision.  Respiratory:  Negative for shortness of breath.   Cardiovascular:  Positive for leg swelling. Negative for chest pain, orthopnea and PND.  Gastrointestinal:  Positive for diarrhea. Negative for abdominal pain, blood in stool and melena.  Neurological:  Negative for dizziness, weakness and headaches.      Objective:     BP (!) 146/78   Pulse 95   Temp 98.4 F (36.9 C) (Oral)   Wt 212 lb 11.2 oz (96.5 kg)   SpO2 96%   BMI 32.34 kg/m  BP Readings from Last 3 Encounters:  10/15/24 (!) 146/78  07/27/24 (!) 144/80  06/30/24 138/62   Wt Readings from Last 3 Encounters:  10/15/24 212 lb 11.2 oz (96.5 kg)  07/27/24 211 lb 12.8 oz (96.1 kg)  06/30/24 210 lb 11.2 oz (95.6 kg)      Physical Exam Vitals reviewed.  Constitutional:      General: He is not in acute distress.    Appearance: He is not ill-appearing.  Cardiovascular:     Rate and Rhythm: Normal rate and regular rhythm.  Pulmonary:  Effort: Pulmonary effort is normal.     Breath sounds: Normal breath sounds.  Musculoskeletal:     Right lower leg: Edema present.     Left lower leg: Edema present.     Comments: Trace pitting edema ankles and lower legs bilaterally.  Neurological:     Mental Status: He is alert.      No results found for any visits on 10/15/24.  Last CBC Lab Results  Component Value Date   WBC 9.3 04/25/2020   HGB 14.0 04/25/2020   HCT 41.0 04/25/2020   MCV 87.0 04/25/2020   MCH 28.5 06/14/2012   RDW 14.9 04/25/2020   PLT 290.0 04/25/2020   Last metabolic panel Lab Results  Component Value Date   GLUCOSE 112 (H) 06/10/2023   NA 141 06/10/2023   K 4.0 06/10/2023   CL  100 06/10/2023   CO2 29 06/10/2023   BUN 17 06/10/2023   CREATININE 1.32 06/10/2023   GFR 51.67 (L) 06/10/2023   CALCIUM  9.2 06/10/2023   PROT 6.8 06/10/2023   ALBUMIN 4.3 06/10/2023   BILITOT 0.5 06/10/2023   ALKPHOS 53 06/10/2023   AST 13 06/10/2023   ALT 11 06/10/2023   Last lipids Lab Results  Component Value Date   CHOL 119 06/10/2023   HDL 46.90 06/10/2023   LDLCALC 45 06/10/2023   LDLDIRECT 172.0 04/16/2022   TRIG 133.0 06/10/2023   CHOLHDL 3 06/10/2023   Last hemoglobin A1c Lab Results  Component Value Date   HGBA1C 6.2 (A) 01/05/2024   Last thyroid  functions Lab Results  Component Value Date   TSH 1.00 06/10/2023      The ASCVD Risk score (Arnett DK, et al., 2019) failed to calculate for the following reasons:   The valid total cholesterol range is 130 to 320 mg/dL    Assessment & Plan:   #1 bilateral leg edema.  Worse past few days.  Only trace pitting edema at this time.  No history of known heart failure.  Denies any associated dyspnea at this time.  No respiratory distress.  Suspect some component of venous stasis.  We recommend the following  -Elevate legs frequently - Check additional labs with comprehensive metabolic panel, BNP, urine microalbumin screen - Consider short-term trial of Lasix 20 mg once daily as needed for edema - Consider echocardiogram if above labs unremarkable and symptoms persist  #2 type 2 diabetes.  History of good control.  Patient on metformin  currently.  Recheck A1c today.  #3 intermittent loose stools to occasional diarrhea.  Possibly related to metformin .  Will try switching from immediate release metformin  to metformin  extended release 750 mg once daily..  Be in touch if loose stools persist  #4 hypertension.  Up slightly today.  Recommend close home monitoring and be in touch if consistently greater than 140 systolic.  Return in about 1 month (around 11/14/2024).    Wolm Scarlet, MD

## 2024-10-18 ENCOUNTER — Ambulatory Visit: Payer: Self-pay | Admitting: Family Medicine

## 2024-10-18 LAB — HEMOGLOBIN A1C: Hgb A1c MFr Bld: 6.4 % (ref 4.6–6.5)

## 2024-11-15 ENCOUNTER — Ambulatory Visit: Admitting: Family Medicine

## 2024-11-15 ENCOUNTER — Encounter: Payer: Self-pay | Admitting: Family Medicine

## 2024-11-15 VITALS — BP 132/68 | HR 79 | Temp 98.1°F | Wt 207.4 lb

## 2024-11-15 DIAGNOSIS — I10 Essential (primary) hypertension: Secondary | ICD-10-CM

## 2024-11-15 DIAGNOSIS — R600 Localized edema: Secondary | ICD-10-CM

## 2024-11-15 DIAGNOSIS — E119 Type 2 diabetes mellitus without complications: Secondary | ICD-10-CM

## 2024-11-15 NOTE — Progress Notes (Signed)
 Established Patient Office Visit  Subjective   Patient ID: Clifford Gibson, male    DOB: 12/02/1946  Age: 78 y.o. MRN: [id number]  Chief Complaint  Patient presents with   Medical Management of Chronic Issues    HPI    Mr. Clifford Gibson has history of hypertension, GERD, type 2 diabetes, hyperlipidemia, mild cognitive impairment.  Seen today in follow-up after recent presentation with some increased bilateral leg edema.  We started furosemide  20 mg daily as needed which they have given only sporadically.  His weight is down almost 6 pounds from last visit and edema is much improved.  He has no orthopnea.  Recent BNP was normal.  Recent A1c 6.4%.  He was having frequent loose stools with immediate relief metformin .  We changed him over to extended release and his loose stools have fully resolved.  He has hypertension treated with Diovan  HCTZ.  He had slightly elevated blood pressure last visit.  They have not been monitoring this regularly at home.  Past Medical History:  Diagnosis Date   Acute serous otitis media 02/22/2010   Arthritis    Diabetes mellitus without complication (HCC)    ERECTILE DYSFUNCTION 04/25/2009   GERD 03/24/2009   HYPERLIPIDEMIA 03/28/2010   HYPERTENSION 03/24/2009   OBESITY, MODERATE 04/25/2009   PONV (postoperative nausea and vomiting)    Past Surgical History:  Procedure Laterality Date   COLONOSCOPY N/A 06/21/2015   Procedure: COLONOSCOPY;  Surgeon: Claudis RAYMOND Rivet, MD;  Location: [facility];  Service: Endoscopy;  Laterality: N/A;  930 - moved to 7/20 @ 7:30   NASAL SEPTUM SURGERY  1974   TONSILLECTOMY     3 yrs. old   TOTAL KNEE ARTHROPLASTY  06/11/2012   Procedure: TOTAL KNEE ARTHROPLASTY;  Surgeon: Reyes JAYSON Billing, MD;  Location: [facility];  Service: Orthopedics;  Laterality: Right;    reports that he has never smoked. He has never used smokeless tobacco. He reports that he does not drink alcohol and does not use drugs. family history includes Dementia  in his father; Scleroderma in his mother. Allergies[1]  Review of Systems  Constitutional:  Negative for malaise/fatigue.  Eyes:  Negative for blurred vision.  Respiratory:  Negative for shortness of breath.   Cardiovascular:  Negative for chest pain.  Gastrointestinal:  Negative for abdominal pain.  Neurological:  Negative for dizziness, weakness and headaches.      Objective:     BP 132/68 (BP Location: Left Arm, Cuff Size: Normal)   Pulse 79   Temp 98.1 F (36.7 C) (Oral)   Wt 207 lb 6.4 oz (94.1 kg)   SpO2 94%   BMI 31.54 kg/m  BP Readings from Last 3 Encounters:  11/15/24 132/68  10/15/24 (!) 146/78  07/27/24 (!) 144/80   Wt Readings from Last 3 Encounters:  11/15/24 207 lb 6.4 oz (94.1 kg)  10/15/24 212 lb 11.2 oz (96.5 kg)  07/27/24 211 lb 12.8 oz (96.1 kg)      Physical Exam Vitals reviewed.  Constitutional:      General: He is not in acute distress.    Appearance: He is well-developed. He is not ill-appearing.  Eyes:     Pupils: Pupils are equal, round, and reactive to light.  Neck:     Thyroid : No thyromegaly.  Cardiovascular:     Rate and Rhythm: Normal rate and regular rhythm.  Pulmonary:     Effort: Pulmonary effort is normal. No respiratory distress.     Breath sounds: Normal  breath sounds. No wheezing or rales.  Musculoskeletal:     Cervical back: Neck supple.     Right lower leg: No edema.     Left lower leg: No edema.  Neurological:     Mental Status: He is alert and oriented to person, place, and time.      No results found for any visits on 11/15/24.  Last CBC Lab Results  Component Value Date   WBC 9.3 04/25/2020   HGB 14.0 04/25/2020   HCT 41.0 04/25/2020   MCV 87.0 04/25/2020   MCH 28.5 06/14/2012   RDW 14.9 04/25/2020   PLT 290.0 04/25/2020   Last metabolic panel Lab Results  Component Value Date   GLUCOSE 117 (H) 10/15/2024   NA 142 10/15/2024   K 4.0 10/15/2024   CL 103 10/15/2024   CO2 30 10/15/2024   BUN 23  10/15/2024   CREATININE 1.34 10/15/2024   GFR 50.27 (L) 10/15/2024   CALCIUM  9.2 10/15/2024   PROT 6.7 10/15/2024   ALBUMIN 4.3 10/15/2024   BILITOT 0.5 10/15/2024   ALKPHOS 52 10/15/2024   AST 11 10/15/2024   ALT 9 10/15/2024   Last lipids Lab Results  Component Value Date   CHOL 134 10/15/2024   HDL 51.30 10/15/2024   LDLCALC 48 10/15/2024   LDLDIRECT 172.0 04/16/2022   TRIG 174.0 (H) 10/15/2024   CHOLHDL 3 10/15/2024   Last hemoglobin A1c Lab Results  Component Value Date   HGBA1C 6.4 10/15/2024      The 10-year ASCVD risk score (Arnett DK, et al., 2019) is: 55.6%    Assessment & Plan:   #1 bilateral leg edema-improved.  Recent BNP normal.  Suspect secondary to diastolic dysfunction.  Weight is down about 6 pounds from last visit and edema has essentially resolved.  They will continue low-sodium diet.  Continue as needed use of furosemide  but only if he is having increased peripheral edema.  Also suggested daily weights and be in touch for weight gain of 3 pounds in 1 day or 5 pounds in 1 week.  #2 type 2 diabetes controlled with recent A1c 6.4%.  Diarrhea and loose stools resolved with change to metformin  extended release  #3 hypertension.  Initial reading was high but came down substantially after rest.  Continue monitoring and be in touch if consistent systolic readings greater than 140.  Try to keep daily sodium intake less than 2500 mg  Wolm Scarlet, MD     [1]  Allergies Allergen Reactions   Lipitor [Atorvastatin  Calcium ] Other (See Comments)   Penicillins Other (See Comments)    Reaction=bleeding,diarrhea   Statins Other (See Comments)    Myalgia

## 2024-11-15 NOTE — Patient Instructions (Signed)
 Consider daily weights (same time of day- eg early morning and with same clothing) and be in touch for weight gain > 3 pounds in one day or 5 pounds in one week.   Watch daily sodium (goal < 2,500).

## 2024-11-24 NOTE — Progress Notes (Signed)
 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 year old RH male with a history of hypertension, hyperlipidemia, DM2 and initial diagnoses mild cognitive impairment likely due to Alzheimer's disease not seen since October 2023 seen today for evaluation of memory loss and new complaint of mild tremors.  Cognitive decline is noted, with MoCA today at 14/30.  He is on donepezil  10 mg daily, and will entertain adding memantine 10 mg twice daily to the regimen after imaging.  He needs more assistance with ADLs needs to drive with some difficulty.  Appears to be more depressed than prior but more withdrawn.  Regarding tremors, they are not very pronounced this morning, however, there is some concern of parkinsonian features, including micrographia, microphonia bradykinesia.  Discussed potential future imaging with [facility] to rule out the possibility of early Parkinson's disease which indeed could also affect his cognitive status and it should be on the differential.  Repeat MRI brain to further evaluate for structural abnormalities and vascular load  Check B12 TSH  Referral to PT for strength and balance Referral to vestibular therapy for vertigo Follow up in 3 months     Discussed the use of AI scribe software for clinical note transcription with the patient, who gave verbal consent to proceed.  History of Present Illness  Clifford Gibson is a 78 year old male who presents with memory loss  He and his family report worsening of his memory especially with new conversations, new information, names, with repetitive questioning and storytelling, and decreased social engagement. He is currently taking donepezil  10 mg daily for memory which they suspect is no longer therapeutic on its own. His wife notes variability in his memory, with good and bad days.He has experienced significant family stress, with the  suicides of his sister and her daughter, which may be contributing to situational depression. His wife reports that he has become more withdrawn since these events.His wife manages his medications, organizing them in a pill box weekly.   He has a history of shuffling gait and has completed a course of physical therapy for strength and balance. He continues to shuffle his feet and walks very slowly, almost in slow motion. He has had recent falls, including one two weeks ago due to vertigo, which resulted in a chipped tooth. He has previously undergone vestibular therapy for benign positional vertigo, which occurs when he bends over.  He is interested in resuming it  He experiences urinary incontinence, wearing incontinence pads and changing them several times a day. His wife attributes this to difficulty getting to the bathroom in time due to his slow movement. He is on Lasix .  He has  loss of smell, which has been ongoing for many years. No hallucinations, paranoia, or significant changes in sleep patterns. He sleeps well without nightmares or dreams.  There is no family history of Parkinson's disease, but his tremors are intermittent, mild, at rest and on intention, with micrographia, difficulty buttoning shirts. His voice has not changed significantly over the past two years, has remained soft. He does not use alcohol or tobacco.   He has intermittent nasal leakage as before, he may be attributing it to medication as prior workup was negative for intracranial abnormalities.       Discussed the use of AI scribe software for clinical note transcription with the patient, who gave verbal consent to proceed.  Allergies[1]  Current Outpatient Medications  Medication Instructions   ACCU-CHEK FASTCLIX LANCETS MISC Test once daily Dx E11.9   albuterol  (VENTOLIN  HFA) 108 (90 Base) MCG/ACT inhaler 2 puffs, Inhalation, Every 6 hours PRN   aspirin EC 81 mg, Daily   Cholecalciferol (VITAMIN D) 50  MCG (2000 UT) CAPS 1 capsule, Daily   donepezil  (ARICEPT ) 10 mg, Oral, Daily at bedtime   Evolocumab  (REPATHA  SURECLICK) 140 MG/ML SOAJ INJECT 140MG  ( ) EVERY 14 DAYS   furosemide  (LASIX ) 20 MG tablet Take one tablet daily as needed for leg edema.   Garlic 1000 MG CAPS 1 capsule, Daily   Gemtesa  75 mg, Oral, Daily   glucose blood (ACCU-CHEK AVIVA) test strip Test once daily. E11.9   metFORMIN  (GLUCOPHAGE -XR) 750 mg, Oral, Daily with breakfast   pantoprazole  (PROTONIX ) 40 MG tablet TAKE ONE TABLET BY MOUTH DAILY (NEED TO MAKE APPOINMENT PER MD)   valsartan -hydrochlorothiazide  (DIOVAN -HCT) 160-12.5 MG tablet TAKE ONE TABLET AT BEDTIME. NEEDS TO BE SEEN FOR REFILLS     VITALS:   Vitals:   11/29/24 0938  BP: (!) 149/81  Pulse: 83  SpO2: 95%  Weight: 210 lb (95.3 kg)     Neurological Exam     11/29/2024   10:00 AM 06/14/2022    3:00 PM  Montreal Cognitive Assessment   Visuospatial/ Executive (0/5) 1 3  Naming (0/3) 2 3  Attention: Read list of digits (0/2) 2 2  Attention: Read list of letters (0/1) 1 1  Attention: Serial 7 subtraction starting at 100 (0/3) 1 2  Language: Repeat phrase (0/2) 0 2  Language : Fluency (0/1) 0 0  Abstraction (0/2) 0 1  Delayed Recall (0/5) 2 2  Orientation (0/6) 5 6  Total 14 22  Adjusted Score (based on education) 14 22       03/10/2018   10:44 AM 09/24/2016    2:07 PM  MMSE - Mini Mental State Exam  Not completed: -- --       Orientation:  Alert and oriented to person, place and not to time . No aphasia or dysarthria. Fund of knowledge is reduced.  Recent and remote memory impaired.  Attention and concentration are reduced .  Able to name objects and unable to repeat phrases.  Delayed recall 2 /5 .  Cranial nerves: There is good facial symmetry. Extraocular muscles are intact and visual fields are full to confrontational testing. Speech is fluent and clear, hypophonia noted. No tongue deviation. Hearing is intact to conversational tone.   Tone: Tone is good throughout.  No cogwheeling noted Sensation: Sensation is intact to light touch.  Vibration is intact at the bilateral big toe.  Coordination: The patient has no difficulty with RAM's or FNF bilaterally. Normal finger to nose  Motor: Strength is 5/5 in the bilateral upper and lower extremities. There is no pronator drift. There are no fasciculations noted. DTR's: Deep tendon reflexes are 2/4 bilaterally. Gait and Station: The patient is able to ambulate without difficulty. Gait is cautious and wider based. Stride length is normal.  Movement examination: Tone: There is normal tone in the bilateral upper extremities.  The tone in the lower extremities is normal.  Abnormal movements: He has minimal intention tremor in today's visit right greater than left, no fasciculations or asterixis Coordination:  There is no decremation with RAM's,   Gait and Station: The patient has some difficulty arising out of a deep-seated chair without the use of the hands. The patient's stride length is normal,  negative pull test.  Thank you for allowing us  the opportunity to participate in the care of this nice patient. Please do not hesitate to contact us  for any questions or concerns.   Total time spent on today's visit was 60 minutes dedicated to this patient today, preparing to see patient, examining the patient, ordering tests and/or medications and counseling the patient, documenting clinical information in the EHR or other health record, independently interpreting results and communicating results to the patient/family, discussing treatment and goals, answering patient's questions and coordinating care.  Cc:  Micheal Wolm ORN, MD  Camie Sevin 11/29/2024 10:30 AM                 [1]  Allergies Allergen Reactions   Lipitor [Atorvastatin  Calcium ] Other (See Comments)   Penicillins Other (See Comments)    Reaction=bleeding,diarrhea   Statins Other (See Comments)    Myalgia     "

## 2024-11-29 ENCOUNTER — Other Ambulatory Visit

## 2024-11-29 ENCOUNTER — Ambulatory Visit: Admitting: Physician Assistant

## 2024-11-29 ENCOUNTER — Encounter: Payer: Self-pay | Admitting: Physician Assistant

## 2024-11-29 VITALS — BP 149/81 | HR 83 | Wt 210.0 lb

## 2024-11-29 DIAGNOSIS — R413 Other amnesia: Secondary | ICD-10-CM

## 2024-11-29 DIAGNOSIS — R269 Unspecified abnormalities of gait and mobility: Secondary | ICD-10-CM

## 2024-11-29 NOTE — Patient Instructions (Addendum)
 It was a pleasure to see you today at our office.   Recommendations:   MRI of the brain, the radiology office will call you to arrange you appointment  [phone] Check labs today  [address] Referral to physical and vestibular therapy at [facility] Follow up in 3 months Recommend visiting the website :  Dementia Success Path to better understand some behaviors related to memory loss.  Consider no further driving     [email or web address]   RECOMMENDATIONS FOR ALL PATIENTS WITH MEMORY PROBLEMS: 1. Continue to exercise (Recommend 30 minutes of walking everyday, or 3 hours every week) 2. Increase social interactions - continue going to [place] and enjoy social gatherings with friends and family 3. Eat healthy, avoid fried foods and eat more fruits and vegetables 4. Maintain adequate blood pressure, blood sugar, and blood cholesterol level. Reducing the risk of stroke and cardiovascular disease also helps promoting better memory. 5. Avoid stressful situations. Live a simple life and avoid aggravations. Organize your time and prepare for the next day in anticipation. 6. Sleep well, avoid any interruptions of sleep and avoid any distractions in the bedroom that may interfere with adequate sleep quality 7. Avoid sugar, avoid sweets as there is a strong link between excessive sugar intake, diabetes, and cognitive impairment We discussed the Mediterranean diet, which has been shown to help patients reduce the risk of progressive memory disorders and reduces cardiovascular risk. This includes eating fish, eat fruits and green leafy vegetables, nuts like almonds and hazelnuts, walnuts, and also use olive oil. Avoid fast foods and fried foods as much as possible. Avoid sweets and sugar as sugar use has been linked to worsening of memory function.  There is always a concern of gradual progression of memory problems. If this is the case,  then we may need to adjust level of care according to patient needs. Support, both to the patient and caregiver, should then be put into place.       DRIVING: Regarding driving, in patients with progressive memory problems, driving will be impaired. We advise to have someone else do the driving if trouble finding directions or if minor accidents are reported. Independent driving assessment is available to determine safety of driving.   If you are interested in the driving assessment, you can contact the following:  [employer] in [place] [phone]  [facility] [phone]  [facility] [phone]  [facility] [phone] or [phone]   FALL PRECAUTIONS: Be cautious when walking. Scan the area for obstacles that may increase the risk of trips and falls. When getting up in the mornings, sit up at the edge of the bed for a few minutes before getting out of bed. Consider elevating the bed at the head end to avoid drop of blood pressure when getting up. Walk always in a well-lit room (use night lights in the walls). Avoid area rugs or power cords from appliances in the middle of the walkways. Use a walker or a cane if necessary and consider physical therapy for balance exercise. Get your eyesight checked regularly.  FINANCIAL OVERSIGHT: Supervision, especially oversight when making financial decisions or transactions is also recommended.  HOME SAFETY: Consider the safety of the kitchen when operating appliances like stoves, microwave oven, and blender. Consider having supervision and share cooking responsibilities until no longer able to participate in those. Accidents with firearms and other hazards in the house should be identified and addressed as well.   ABILITY TO BE LEFT ALONE:  If patient is unable to contact 911 operator, consider using LifeLine, or when the need is there, arrange for someone to stay with patients. Smoking is a  fire hazard, consider supervision or cessation. Risk of wandering should be assessed by caregiver and if detected at any point, supervision and safe proof recommendations should be instituted.  MEDICATION SUPERVISION: Inability to self-administer medication needs to be constantly addressed. Implement a mechanism to ensure safe administration of the medications.      Mediterranean Diet A Mediterranean diet refers to food and lifestyle choices that are based on the traditions of countries located on the [employer]. This way of eating has been shown to help prevent certain conditions and improve outcomes for people who have chronic diseases, like kidney disease and heart disease. What are tips for following this plan? Lifestyle  Cook and eat meals together with your family, when possible. Drink enough fluid to keep your urine clear or pale yellow. Be physically active every day. This includes: Aerobic exercise like running or swimming. Leisure activities like gardening, walking, or housework. Get 7-8 hours of sleep each night. If recommended by your health care provider, drink red wine in moderation. This means 1 glass a day for nonpregnant women and 2 glasses a day for men. A glass of wine equals 5 oz (150 mL). Reading food labels  Check the serving size of packaged foods. For foods such as rice and pasta, the serving size refers to the amount of cooked product, not dry. Check the total fat in packaged foods. Avoid foods that have saturated fat or trans fats. Check the ingredients list for added sugars, such as corn syrup. Shopping  At the grocery store, buy most of your food from the areas near the walls of the store. This includes: Fresh fruits and vegetables (produce). Grains, beans, nuts, and seeds. Some of these may be available in unpackaged forms or large amounts (in bulk). Fresh seafood. Poultry and eggs. Low-fat dairy products. Buy whole ingredients instead of prepackaged  foods. Buy fresh fruits and vegetables in-season from local farmers markets. Buy frozen fruits and vegetables in resealable bags. If you do not have access to quality fresh seafood, buy precooked frozen shrimp or canned fish, such as tuna, salmon, or sardines. Buy small amounts of raw or cooked vegetables, salads, or olives from the deli or salad bar at your store. Stock your pantry so you always have certain foods on hand, such as olive oil, canned tuna, canned tomatoes, rice, pasta, and beans. Cooking  Cook foods with extra-virgin olive oil instead of using butter or other vegetable oils. Have meat as a side dish, and have vegetables or grains as your main dish. This means having meat in small portions or adding small amounts of meat to foods like pasta or stew. Use beans or vegetables instead of meat in common dishes like chili or lasagna. Experiment with different cooking methods. Try roasting or broiling vegetables instead of steaming or sauteing them. Add frozen vegetables to soups, stews, pasta, or rice. Add nuts or seeds for added healthy fat at each meal. You can add these to yogurt, salads, or vegetable dishes. Marinate fish or vegetables using olive oil, lemon juice, garlic, and fresh herbs. Meal planning  Plan to eat 1 vegetarian meal one day each week. Try to work up to 2 vegetarian meals, if possible. Eat seafood 2 or more times a week. Have healthy snacks readily available, such as: Vegetable sticks with hummus. Greek yogurt. Fruit and  nut trail mix. Eat balanced meals throughout the week. This includes: Fruit: 2-3 servings a day Vegetables: 4-5 servings a day Low-fat dairy: 2 servings a day Fish, poultry, or lean meat: 1 serving a day Beans and legumes: 2 or more servings a week Nuts and seeds: 1-2 servings a day Whole grains: 6-8 servings a day Extra-virgin olive oil: 3-4 servings a day Limit red meat and sweets to only a few servings a month What are my food  choices? Mediterranean diet Recommended Grains: Whole-grain pasta. Brown rice. Bulgar wheat. Polenta. Couscous. Whole-wheat bread. [place] Madeira. Vegetables: Artichokes. Beets. Broccoli. Cabbage. Carrots. Eggplant. Green beans. Chard. Kale. Spinach. Onions. Leeks. Peas. Squash. Tomatoes. Peppers. Radishes. Fruits: Apples. Apricots. Avocado. Berries. Bananas. Cherries. Dates. Figs. Grapes. Lemons. Melon. Oranges. Peaches. Plums. Pomegranate. Meats and other protein foods: Beans. Almonds. Sunflower seeds. Pine nuts. Peanuts. Cod. Salmon. Scallops. Shrimp. Tuna. Tilapia. Clams. Oysters. Eggs. Dairy: Low-fat milk. Cheese. Greek yogurt. Beverages: Water . Red wine. Herbal tea. Fats and oils: Extra virgin olive oil. Avocado oil. Grape seed oil. Sweets and desserts: Greek yogurt with honey. Baked apples. Poached pears. Trail mix. Seasoning and other foods: Basil. Cilantro. Coriander. Cumin. Mint. Parsley. Sage. Rosemary. Tarragon. Garlic. Oregano. Thyme. Pepper. Balsalmic vinegar. Tahini. Hummus. Tomato sauce. Olives. Mushrooms. Limit these Grains: Prepackaged pasta or rice dishes. Prepackaged cereal with added sugar. Vegetables: Deep fried potatoes (french fries). Fruits: Fruit canned in syrup. Meats and other protein foods: Beef. Pork. Lamb. Poultry with skin. Hot dogs. [place]. Dairy: Ice cream. Sour cream. Whole milk. Beverages: Juice. Sugar-sweetened soft drinks. Beer. Liquor and spirits. Fats and oils: Butter. Canola oil. Vegetable oil. Beef fat (tallow). Lard. Sweets and desserts: Cookies. Cakes. Pies. Candy. Seasoning and other foods: Mayonnaise. Premade sauces and marinades. The items listed may not be a complete list. Talk with your dietitian about what dietary choices are right for you. Summary The Mediterranean diet includes both food and lifestyle choices. Eat a variety of fresh fruits and vegetables, beans, nuts, seeds, and whole grains. Limit the amount of red meat and sweets that  you eat. Talk with your health care provider about whether it is safe for you to drink red wine in moderation. This means 1 glass a day for nonpregnant women and 2 glasses a day for men. A glass of wine equals 5 oz (150 mL). This information is not intended to replace advice given to you by your health care provider. Make sure you discuss any questions you have with your health care provider. Document Released: 07/11/2016 Document Revised: 08/13/2016 Document Reviewed: 07/11/2016 Elsevier Interactive Patient Education  2017 [employer].

## 2024-11-30 ENCOUNTER — Ambulatory Visit: Payer: Self-pay | Admitting: Physician Assistant

## 2024-11-30 LAB — VITAMIN B12: Vitamin B-12: 436 pg/mL (ref 200–1100)

## 2024-11-30 LAB — TSH: TSH: 0.9 m[IU]/L (ref 0.40–4.50)

## 2024-12-07 ENCOUNTER — Other Ambulatory Visit: Payer: Self-pay

## 2024-12-07 ENCOUNTER — Encounter: Payer: Self-pay | Admitting: Physical Therapy

## 2024-12-07 ENCOUNTER — Ambulatory Visit: Attending: Physician Assistant | Admitting: Physical Therapy

## 2024-12-07 DIAGNOSIS — R2681 Unsteadiness on feet: Secondary | ICD-10-CM | POA: Insufficient documentation

## 2024-12-07 DIAGNOSIS — R269 Unspecified abnormalities of gait and mobility: Secondary | ICD-10-CM | POA: Diagnosis not present

## 2024-12-07 DIAGNOSIS — M6281 Muscle weakness (generalized): Secondary | ICD-10-CM | POA: Diagnosis present

## 2024-12-07 DIAGNOSIS — R42 Dizziness and giddiness: Secondary | ICD-10-CM | POA: Diagnosis present

## 2024-12-07 NOTE — Therapy (Signed)
 " OUTPATIENT PHYSICAL THERAPY VESTIBULAR EVALUATION     Patient Name: Clifford Gibson MRN: [id number] DOB:11-Sep-1946, 79 y.o., male Today's Date: 12/07/2024  END OF SESSION:  PT End of Session - 12/07/24 1636     Visit Number 1    Authorization Type [employer]    PT Start Time 1346    PT Stop Time 1433    PT Time Calculation (min) 47 min          Past Medical History:  Diagnosis Date   Acute serous otitis media 02/22/2010   Arthritis    Diabetes mellitus without complication (HCC)    ERECTILE DYSFUNCTION 04/25/2009   GERD 03/24/2009   HYPERLIPIDEMIA 03/28/2010   HYPERTENSION 03/24/2009   OBESITY, MODERATE 04/25/2009   PONV (postoperative nausea and vomiting)    Past Surgical History:  Procedure Laterality Date   COLONOSCOPY N/A 06/21/2015   Procedure: COLONOSCOPY;  Surgeon: Claudis RAYMOND Rivet, MD;  Location: [facility];  Service: Endoscopy;  Laterality: N/A;  930 - moved to 7/20 @ 7:30   NASAL SEPTUM SURGERY  1974   TONSILLECTOMY     3 yrs. old   TOTAL KNEE ARTHROPLASTY  06/11/2012   Procedure: TOTAL KNEE ARTHROPLASTY;  Surgeon: Reyes JAYSON Billing, MD;  Location: [facility];  Service: Orthopedics;  Laterality: Right;   Patient Active Problem List   Diagnosis Date Noted   Mild cognitive impairment 06/14/2022   Rhinorrhea 06/14/2022   Type 2 diabetes mellitus, controlled (HCC) 07/04/2015   Hyperglycemia 02/02/2015   Allergic rhinitis 05/14/2013   Hyperlipemia 03/28/2010   ACUTE SEROUS OTITIS MEDIA 02/22/2010   OBESITY, MODERATE 04/25/2009   ERECTILE DYSFUNCTION 04/25/2009   Essential hypertension 03/24/2009   GERD 03/24/2009    PCP: Micheal Wolm ORN, MD REFERRING PROVIDER: Dina Camie BRAVO, PA-C   REFERRING DIAG: R26.9 (ICD-10-CM) - Gait abnormality   THERAPY DIAG:  Unsteadiness on feet  Muscle weakness (generalized)  Dizziness and giddiness  ONSET DATE: 3 years ago -- feels it started with cholesterol meds  Rationale for Evaluation and Treatment:  Rehabilitation  SUBJECTIVE:   SUBJECTIVE STATEMENT: Pt states he has a history of vertigo for several years. Has been trying to avoid turning his head quickly. Mostly feels it with quick head turns. It has caused falls. Makes it feel like the room is spinning. Feels it most when he's upright. Has had canalith repositioning maneuvers performed a couple of years ago but didn't feel like it helped. Staying still makes it feel better.  Pt accompanied by: significant other  PERTINENT HISTORY: R TKA, L knee OA  PAIN:  Are you having pain? Chronic knee pain  PRECAUTIONS: Fall  RED FLAGS: None   WEIGHT BEARING RESTRICTIONS: No  FALLS: Has patient fallen in last 6 months? Yes. Number of falls 4 -- mostly caused with quick head turns. Last fall 2-3 weeks ago  LIVING ENVIRONMENT: Lives with: lives with their spouse Lives in: House/apartment Stairs: Basement stairs ~12 Has following equipment at home: Single point cane and Walker - 2 wheeled (uses it sometimes); shower chairs  PLOF: Independent  PATIENT GOALS: Decrease pain, improve movement, improve strength, less difficulty with home activities  OBJECTIVE:  Note: Objective measures were completed at Evaluation unless otherwise noted.  DIAGNOSTIC FINDINGS: n/a  COGNITION: Overall cognitive status: Within functional limits for tasks assessed   SENSATION: WFL  EDEMA:  Mild swelling in feet  POSTURE:  rounded shoulders, forward head, and R lateral neck flexion  Cervical ROM:  Active A/PROM (deg) eval  Flexion   Extension   Right lateral flexion   Left lateral flexion   Right rotation   Left rotation   (Blank rows = not tested)  STRENGTH: See 5x STS  LOWER EXTREMITY MMT:   MMT Right eval Left eval  Hip flexion    Hip abduction    Hip adduction    Hip internal rotation    Hip external rotation    Knee flexion    Knee extension    Ankle dorsiflexion    Ankle plantarflexion    Ankle inversion    Ankle  eversion    (Blank rows = not tested)  BED MOBILITY:  SBA  TRANSFERS: Assistive device utilized: Single point cane  Sit to stand: SBA Stand to sit: SBA Chair to chair: SBA Floor: did not assess   GAIT: Gait pattern: step through pattern, decreased step length- Right, decreased step length- Left, shuffling, lateral lean- Right, and lateral lean- Left Distance walked: Into clinic Assistive device utilized: Single point cane Level of assistance: Modified independence Comments:    VESTIBULAR ASSESSMENT:  GENERAL OBSERVATION: Slow with all movements   SYMPTOM BEHAVIOR:  Subjective history: See above  Non-Vestibular symptoms: None  Type of dizziness: Unsteady with head/body turns and World moves  Frequency: with head movement  Duration: 1-2 sec  Aggravating factors: Induced by motion: turning body quickly and turning head quickly  Relieving factors: head stationary and slow movements  Progression of symptoms: unchanged  OCULOMOTOR EXAM:  Ocular Alignment: normal R lateral neck flexion at baseline  Ocular ROM: No Limitations  Spontaneous Nystagmus: absent  Gaze-Induced Nystagmus: right beating with right gaze  Smooth Pursuits: intact and slightly hypomobile  Saccades: hypometric/undershoots and slow  Cover-cross-cover test: Normal  FRENZEL - FIXATION SUPRESSED: Unable to assess  VESTIBULAR - OCULAR REFLEX:   Slow VOR: Positive Bilaterally  VOR Cancellation: Corrective Saccades  Head-Impulse Test: HIT Right: positive HIT Left: positive  Dynamic Visual Acuity: Not able to be assessed   POSITIONAL TESTING: Right Sidelying: no nystagmus Left Sidelying: no nystagmus  MOTION SENSITIVITY:  Did not assess  FUNCTIONAL GAIT:  5 times sit to stand: 19.21 sec with UE support mCTSIB: Condition 1: 30 sec, condition 2: 30 sec, condition 3: 30 sec, condition 4: 5 sec Mini Bestest: 14  [facility] PT Assessment - 12/07/24 0001       Standardized Balance Assessment    Standardized Balance Assessment Mini-BESTest;Timed Up and Go Test      Mini-BESTest   Sit To Stand Moderate: Comes to stand WITH use of hands on first attempt.    Rise to Toes Normal: Stable for 3 s with maximum height.    Stand on one leg (left) Moderate: < 20 s    Stand on one leg (right) Moderate: < 20 s    Stand on one leg - lowest score 1    Compensatory Stepping Correction - Forward Normal: Recovers independently with a single, large step (second realignement is allowed).    Compensatory Stepping Correction - Backward No step, OR would fall if not caught, OR falls spontaneously.    Compensatory Stepping Correction - Left Lateral Moderate: Several steps to recover equilibrium    Compensatory Stepping Correction - Right Lateral Normal: Recovers independently with 1 step (crossover or lateral OK)    Stepping Corredtion Lateral - lowest score 1    Stance - Feet together, eyes open, firm surface  Normal: 30s    Stance - Feet together, eyes closed,  foam surface  Moderate: < 30s    Incline - Eyes Closed Severe: Unable    Change in Gait Speed Moderate: Unable to change walking speed or signs of imbalance    Walk with head turns - Horizontal Severe: performs head turns with imbalance    Walk with pivot turns Moderate:Turns with feet close SLOW (>4 steps) with good balance.    Step over obstacles Moderate: Steps over box but touches box OR displays cautious behavior by slowing gait.    Timed UP & GO with Dual Task Moderate: Dual Task affects either counting OR walking (>10%) when compared to the TUG without Dual Task.    Mini-BEST total score 14      Timed Up and Go Test   Normal TUG (seconds) 17    Cognitive TUG (seconds) 33.06                                                                                                                                    TREATMENT DATE: 12/07/24 Self care: see education  PATIENT EDUCATION: Education details: Exam findings, POC, initial HEP,  vestibular rehab/balance Person educated: Patient Education method: Explanation, Demonstration, and Handouts Education comprehension: verbalized understanding, returned demonstration, and needs further education  HOME EXERCISE PROGRAM:  GOALS: Goals reviewed with patient? Yes  SHORT TERM GOALS: Target date: 01/04/2025   Pt will be ind with initial HEP Baseline: Goal status: INITIAL  2.  Pt will demo improved 5x STS to </=15 sec with UE support as needed Baseline:  Goal status: INITIAL  3.  Pt will have improved TUG to </=14 sec to demo reduced fall risk Baseline:  Goal status: INITIAL  4.  Pt will be able to perform VOR x 1 at 60 bpm for improved gaze stabilization Baseline:  Goal status: INITIAL   LONG TERM GOALS: Target date: 02/01/2025   Pt will be ind with management and progression of HEP Baseline:  Goal status: INITIAL  2.  Pt will have improved Mini BESTest to >/=23/28 to demo decreased fall risk for his age group Baseline:  Goal status: INITIAL  3.  Pt will have improved 5x STS to </=14 sec without UE support Baseline:  Goal status: INITIAL  4.  Pt will demo improved vestibular integration with standing balance by standing feet together eyes closed on foam for 30 sec Baseline:  Goal status: INITIAL   ASSESSMENT:  CLINICAL IMPRESSION: Patient is a 79 y.o. M who was seen today for physical therapy evaluation and treatment for gait abnormalities/vestibular issues. Neurology office visit mentions potential imaging with DaTscan to rule out possible Parkinson's. Assessment is significant for very poor VOR and vestibular function affecting mobility and balance. Pt is a high fall risk based on his MiniBESTest and 5x STS. Pt will benefit from PT to improve on these deficits and reduce falls.   OBJECTIVE IMPAIRMENTS: Abnormal gait, decreased activity tolerance, decreased balance, decreased coordination, decreased  endurance, decreased mobility, difficulty walking,  decreased ROM, decreased strength, increased fascial restrictions, increased muscle spasms, improper body mechanics, postural dysfunction, and pain.   ACTIVITY LIMITATIONS: lifting, standing, squatting, stairs, transfers, and locomotion level  PARTICIPATION LIMITATIONS: cleaning, laundry, driving, shopping, and community activity  PERSONAL FACTORS: Age, Fitness, Past/current experiences, and Time since onset of injury/illness/exacerbation are also affecting patient's functional outcome.   REHAB POTENTIAL: Good  CLINICAL DECISION MAKING: Evolving/moderate complexity  EVALUATION COMPLEXITY: Moderate   PLAN:  PT FREQUENCY: 2x/week  PT DURATION: 8 weeks  PLANNED INTERVENTIONS: [id number]- PT Re-evaluation, 97750- Physical Performance Testing, 97110-Therapeutic exercises, 97530- Therapeutic activity, [id number]- Neuromuscular re-education, 97535- Self Care, [id number]- Manual therapy, [id number]- Gait training, [id number]- Aquatic Therapy, [phone]- Ionotophoresis 4mg /ml Dexamethasone , 79439 (1-2 muscles), 20561 (3+ muscles)- Dry Needling, Patient/Family education, Balance training, Stair training, Taping, Cryotherapy, and Moist heat  PLAN FOR NEXT SESSION: Initiate gaze stabilization/oculomotor exercises. Work on programmer, applications with balance and reactive balance/stepping strategies.    Vennela Jutte April Ma L Charvez [place], PT 12/07/2024, 4:38 PM  "

## 2024-12-10 ENCOUNTER — Other Ambulatory Visit: Payer: Self-pay | Admitting: Family Medicine

## 2024-12-10 DIAGNOSIS — I10 Essential (primary) hypertension: Secondary | ICD-10-CM

## 2024-12-13 ENCOUNTER — Ambulatory Visit: Admitting: Physical Therapy

## 2024-12-13 ENCOUNTER — Encounter: Payer: Self-pay | Admitting: Physician Assistant

## 2024-12-13 DIAGNOSIS — R2681 Unsteadiness on feet: Secondary | ICD-10-CM | POA: Diagnosis not present

## 2024-12-13 DIAGNOSIS — R42 Dizziness and giddiness: Secondary | ICD-10-CM

## 2024-12-13 DIAGNOSIS — M6281 Muscle weakness (generalized): Secondary | ICD-10-CM

## 2024-12-13 NOTE — Therapy (Signed)
 " OUTPATIENT PHYSICAL THERAPY    Patient Name: Clifford Gibson MRN: [id number] DOB:[birth year], 79 y.o., male Today's Date: 12/13/2024  END OF SESSION:  PT End of Session - 12/13/24 1304     Visit Number 2    Number of Visits 11    Authorization Type [employer]    Authorization Time Period 10 visits approved 12/07/24-03/07/25    Authorization - Number of Visits 10    PT Start Time 1300    PT Stop Time 1340    PT Time Calculation (min) 40 min    Activity Tolerance Patient tolerated treatment well           Past Medical History:  Diagnosis Date   Acute serous otitis media 02/22/2010   Arthritis    Diabetes mellitus without complication (HCC)    ERECTILE DYSFUNCTION 04/25/2009   GERD 03/24/2009   HYPERLIPIDEMIA 03/28/2010   HYPERTENSION 03/24/2009   OBESITY, MODERATE 04/25/2009   PONV (postoperative nausea and vomiting)    Past Surgical History:  Procedure Laterality Date   COLONOSCOPY N/A 06/21/2015   Procedure: COLONOSCOPY;  Surgeon: Claudis RAYMOND Rivet, MD;  Location: [facility];  Service: Endoscopy;  Laterality: N/A;  930 - moved to 7/20 @ 7:30   NASAL SEPTUM SURGERY  1974   TONSILLECTOMY     3 yrs. old   TOTAL KNEE ARTHROPLASTY  06/11/2012   Procedure: TOTAL KNEE ARTHROPLASTY;  Surgeon: Reyes JAYSON Billing, MD;  Location: [facility];  Service: Orthopedics;  Laterality: Right;   Patient Active Problem List   Diagnosis Date Noted   Mild cognitive impairment 06/14/2022   Rhinorrhea 06/14/2022   Type 2 diabetes mellitus, controlled (HCC) 07/04/2015   Hyperglycemia 02/02/2015   Allergic rhinitis 05/14/2013   Hyperlipemia 03/28/2010   ACUTE SEROUS OTITIS MEDIA 02/22/2010   OBESITY, MODERATE 04/25/2009   ERECTILE DYSFUNCTION 04/25/2009   Essential hypertension 03/24/2009   GERD 03/24/2009    PCP: Micheal Wolm ORN, MD REFERRING PROVIDER: Wertman, Sara E, PA-C   REFERRING DIAG: R26.9 (ICD-10-CM) - Gait abnormality   THERAPY DIAG:  No diagnosis found.  ONSET DATE: 3  years ago -- feels it started with cholesterol meds  Rationale for Evaluation and Treatment: Rehabilitation  SUBJECTIVE:   SUBJECTIVE STATEMENT: Pt reports nothing new or different since eval.  Pt accompanied by: significant other  PERTINENT HISTORY: R TKA, L knee OA  PAIN:  Are you having pain? Chronic knee pain  PRECAUTIONS: Fall  RED FLAGS: None   WEIGHT BEARING RESTRICTIONS: No  FALLS: Has patient fallen in last 6 months? Yes. Number of falls 4 -- mostly caused with quick head turns. Last fall 2-3 weeks ago  LIVING ENVIRONMENT: Lives with: lives with their spouse Lives in: House/apartment Stairs: Basement stairs ~12 Has following equipment at home: Single point cane and Walker - 2 wheeled (uses it sometimes); shower chairs  PLOF: Independent  PATIENT GOALS: Decrease pain, improve movement, improve strength, less difficulty with home activities  OBJECTIVE:  Note: Objective measures were completed at Evaluation unless otherwise noted.  DIAGNOSTIC FINDINGS: n/a  COGNITION: Overall cognitive status: Within functional limits for tasks assessed   SENSATION: WFL  EDEMA:  Mild swelling in feet  POSTURE:  rounded shoulders, forward head, and R lateral neck flexion  Cervical ROM:    Active A/PROM (deg) eval  Flexion   Extension   Right lateral flexion   Left lateral flexion   Right rotation   Left rotation   (Blank rows = not tested)  STRENGTH: See 5x STS  LOWER EXTREMITY MMT:   MMT Right eval Left eval  Hip flexion    Hip abduction    Hip adduction    Hip internal rotation    Hip external rotation    Knee flexion    Knee extension    Ankle dorsiflexion    Ankle plantarflexion    Ankle inversion    Ankle eversion    (Blank rows = not tested)  BED MOBILITY:  SBA  TRANSFERS: Assistive device utilized: Single point cane  Sit to stand: SBA Stand to sit: SBA Chair to chair: SBA Floor: did not assess   GAIT: Gait pattern: step through  pattern, decreased step length- Right, decreased step length- Left, shuffling, lateral lean- Right, and lateral lean- Left Distance walked: Into clinic Assistive device utilized: Single point cane Level of assistance: Modified independence Comments:    VESTIBULAR ASSESSMENT:  GENERAL OBSERVATION: Slow with all movements   SYMPTOM BEHAVIOR:  Subjective history: See above  Non-Vestibular symptoms: None  Type of dizziness: Unsteady with head/body turns and World moves  Frequency: with head movement  Duration: 1-2 sec  Aggravating factors: Induced by motion: turning body quickly and turning head quickly  Relieving factors: head stationary and slow movements  Progression of symptoms: unchanged  OCULOMOTOR EXAM:  Ocular Alignment: normal R lateral neck flexion at baseline  Ocular ROM: No Limitations  Spontaneous Nystagmus: absent  Gaze-Induced Nystagmus: right beating with right gaze  Smooth Pursuits: intact and slightly hypomobile  Saccades: hypometric/undershoots and slow  Cover-cross-cover test: Normal  FRENZEL - FIXATION SUPRESSED: Unable to assess  VESTIBULAR - OCULAR REFLEX:   Slow VOR: Positive Bilaterally  VOR Cancellation: Corrective Saccades  Head-Impulse Test: HIT Right: positive HIT Left: positive  Dynamic Visual Acuity: Not able to be assessed   POSITIONAL TESTING: Right Sidelying: no nystagmus Left Sidelying: no nystagmus  MOTION SENSITIVITY:  Did not assess  FUNCTIONAL GAIT:  5 times sit to stand: 19.21 sec with UE support mCTSIB: Condition 1: 30 sec, condition 2: 30 sec, condition 3: 30 sec, condition 4: 5 sec Mini Bestest: 14                                                                                                                              TREATMENT DATE:  12/13/24 Nustep L5 x 10 min UEs/LEs Seated: Smooth pursuit horizontal and vertical 2x30 Saccades horizontal and vertical 2x30 VOR x1 horizontal and vertical; 2x30 at 90  BPM Corrective saccades horizontal 2x30 with increased PT cueing VOR cancellation horizontal and vertical 2x10 each Sit<>stand with eyes looking at target down and then up x10   12/07/24 Self care: see education  PATIENT EDUCATION: Education details: Exam findings, POC, initial HEP, vestibular rehab/balance Person educated: Patient Education method: Explanation, Demonstration, and Handouts Education comprehension: verbalized understanding, returned demonstration, and needs further education  HOME EXERCISE PROGRAM: Access Code: [email or web address] URL: [email or web address] Date: 12/13/2024 Prepared by: Reef Achterberg April Marie Shakevia Sarris  Exercises - Seated Horizontal Smooth Pursuit  - 1 x daily - 7 x weekly - 2 sets - 30 sec hold - Seated Vertical Smooth Pursuit  - 1 x daily - 7 x weekly - 2 sets - 30 sec hold - Seated Horizontal Saccades  - 1 x daily - 7 x weekly - 2 sets - 30 sec hold - Seated Vertical Saccades  - 1 x daily - 7 x weekly - 2 sets - 30 sec hold - Seated Gaze Stabilization with Head Rotation  - 1 x daily - 7 x weekly - 2 sets - 30 sec hold - Seated Gaze Stabilization with Head Nod  - 1 x daily - 7 x weekly - 3 sets - 30 sec hold  GOALS: Goals reviewed with patient? Yes  SHORT TERM GOALS: Target date: 01/04/2025   Pt will be ind with initial HEP Baseline: Goal status: INITIAL  2.  Pt will demo improved 5x STS to </=15 sec with UE support as needed Baseline:  Goal status: INITIAL  3.  Pt will have improved TUG to </=14 sec to demo reduced fall risk Baseline:  Goal status: INITIAL  4.  Pt will be able to perform VOR x 1 at 60 bpm for improved gaze stabilization Baseline:  Goal status: INITIAL   LONG TERM GOALS: Target date: 02/01/2025   Pt will be ind with management and progression of HEP Baseline:  Goal status: INITIAL  2.  Pt will have improved Mini BESTest to >/=23/28 to demo decreased fall risk for his age group Baseline:  Goal status: INITIAL  3.   Pt will have improved 5x STS to </=14 sec without UE support Baseline:  Goal status: INITIAL  4.  Pt will demo improved vestibular integration with standing balance by standing feet together eyes closed on foam for 30 sec Baseline:  Goal status: INITIAL   ASSESSMENT:  CLINICAL IMPRESSION: Treatment focused on initiating vestibular and gaze stabilization exercises. Difficulty with quick and fast eye movement with smooth tracking.   OBJECTIVE IMPAIRMENTS: Abnormal gait, decreased activity tolerance, decreased balance, decreased coordination, decreased endurance, decreased mobility, difficulty walking, decreased ROM, decreased strength, increased fascial restrictions, increased muscle spasms, improper body mechanics, postural dysfunction, and pain.   ACTIVITY LIMITATIONS: lifting, standing, squatting, stairs, transfers, and locomotion level  PARTICIPATION LIMITATIONS: cleaning, laundry, driving, shopping, and community activity  PERSONAL FACTORS: Age, Fitness, Past/current experiences, and Time since onset of injury/illness/exacerbation are also affecting patient's functional outcome.   REHAB POTENTIAL: Good  CLINICAL DECISION MAKING: Evolving/moderate complexity  EVALUATION COMPLEXITY: Moderate   PLAN:  PT FREQUENCY: 2x/week  PT DURATION: 8 weeks  PLANNED INTERVENTIONS: 97164- PT Re-evaluation, 97750- Physical Performance Testing, 97110-Therapeutic exercises, 97530- Therapeutic activity, [id number]- Neuromuscular re-education, 97535- Self Care, [id number]- Manual therapy, [id number]- Gait training, [phone]- Aquatic Therapy, [phone]- Ionotophoresis 4mg /ml Dexamethasone , 79439 (1-2 muscles), 20561 (3+ muscles)- Dry Needling, Patient/Family education, Balance training, Stair training, Taping, Cryotherapy, and Moist heat  PLAN FOR NEXT SESSION: Initiate gaze stabilization/oculomotor exercises. Work on programmer, applications with balance and reactive balance/stepping strategies.    Yentl Verge April Ma L  Delfina Schreurs, PT 12/13/2024, 1:04 PM  "

## 2024-12-16 ENCOUNTER — Ambulatory Visit: Admitting: Physical Therapy

## 2024-12-16 DIAGNOSIS — M6281 Muscle weakness (generalized): Secondary | ICD-10-CM

## 2024-12-16 DIAGNOSIS — R2681 Unsteadiness on feet: Secondary | ICD-10-CM | POA: Diagnosis not present

## 2024-12-16 DIAGNOSIS — R42 Dizziness and giddiness: Secondary | ICD-10-CM

## 2024-12-16 NOTE — Therapy (Signed)
 " OUTPATIENT PHYSICAL THERAPY    Patient Name: Clifford Gibson MRN: [id number] DOB:03/15/1946, 79 y.o., male Today's Date: 12/16/2024  END OF SESSION:  PT End of Session - 12/16/24 1258     Visit Number 3    Number of Visits 11    Authorization Type [employer]    Authorization Time Period 10 visits approved 12/07/24-03/07/25    Authorization - Number of Visits 10    PT Start Time 1300    PT Stop Time 1340    PT Time Calculation (min) 40 min    Activity Tolerance Patient tolerated treatment well          Past Medical History:  Diagnosis Date   Acute serous otitis media 02/22/2010   Arthritis    Diabetes mellitus without complication (HCC)    ERECTILE DYSFUNCTION 04/25/2009   GERD 03/24/2009   HYPERLIPIDEMIA 03/28/2010   HYPERTENSION 03/24/2009   OBESITY, MODERATE 04/25/2009   PONV (postoperative nausea and vomiting)    Past Surgical History:  Procedure Laterality Date   COLONOSCOPY N/A 06/21/2015   Procedure: COLONOSCOPY;  Surgeon: Claudis RAYMOND Rivet, MD;  Location: [facility];  Service: Endoscopy;  Laterality: N/A;  930 - moved to 7/20 @ 7:30   NASAL SEPTUM SURGERY  1974   TONSILLECTOMY     3 yrs. old   TOTAL KNEE ARTHROPLASTY  06/11/2012   Procedure: TOTAL KNEE ARTHROPLASTY;  Surgeon: Reyes JAYSON Billing, MD;  Location: [facility];  Service: Orthopedics;  Laterality: Right;   Patient Active Problem List   Diagnosis Date Noted   Mild cognitive impairment 06/14/2022   Rhinorrhea 06/14/2022   Type 2 diabetes mellitus, controlled (HCC) 07/04/2015   Hyperglycemia 02/02/2015   Allergic rhinitis 05/14/2013   Hyperlipemia 03/28/2010   ACUTE SEROUS OTITIS MEDIA 02/22/2010   OBESITY, MODERATE 04/25/2009   ERECTILE DYSFUNCTION 04/25/2009   Essential hypertension 03/24/2009   GERD 03/24/2009    PCP: Micheal Wolm ORN, MD REFERRING PROVIDER: Dina Camie BRAVO, PA-C   REFERRING DIAG: R26.9 (ICD-10-CM) - Gait abnormality   THERAPY DIAG:  Unsteadiness on feet  Muscle weakness  (generalized)  Dizziness and giddiness  ONSET DATE: 3 years ago -- feels it started with cholesterol meds  Rationale for Evaluation and Treatment: Rehabilitation  SUBJECTIVE:   SUBJECTIVE STATEMENT: Pt states he's been working on his HEP.  Pt accompanied by: significant other  PERTINENT HISTORY: R TKA, L knee OA  PAIN:  Are you having pain? Chronic knee pain  PRECAUTIONS: Fall  RED FLAGS: None   WEIGHT BEARING RESTRICTIONS: No  FALLS: Has patient fallen in last 6 months? Yes. Number of falls 4 -- mostly caused with quick head turns. Last fall 2-3 weeks ago  LIVING ENVIRONMENT: Lives with: lives with their spouse Lives in: House/apartment Stairs: Basement stairs ~12 Has following equipment at home: Single point cane and Walker - 2 wheeled (uses it sometimes); shower chairs  PLOF: Independent  PATIENT GOALS: Decrease pain, improve movement, improve strength, less difficulty with home activities  OBJECTIVE:  Note: Objective measures were completed at Evaluation unless otherwise noted.  DIAGNOSTIC FINDINGS: n/a  COGNITION: Overall cognitive status: Within functional limits for tasks assessed   SENSATION: WFL  EDEMA:  Mild swelling in feet  POSTURE:  rounded shoulders, forward head, and R lateral neck flexion  Cervical ROM:    Active A/PROM (deg) eval  Flexion   Extension   Right lateral flexion   Left lateral flexion   Right rotation   Left rotation   (  Blank rows = not tested)  STRENGTH: See 5x STS  LOWER EXTREMITY MMT:   MMT Right eval Left eval  Hip flexion    Hip abduction    Hip adduction    Hip internal rotation    Hip external rotation    Knee flexion    Knee extension    Ankle dorsiflexion    Ankle plantarflexion    Ankle inversion    Ankle eversion    (Blank rows = not tested)  BED MOBILITY:  SBA  TRANSFERS: Assistive device utilized: Single point cane  Sit to stand: SBA Stand to sit: SBA Chair to chair: SBA Floor: did  not assess   GAIT: Gait pattern: step through pattern, decreased step length- Right, decreased step length- Left, shuffling, lateral lean- Right, and lateral lean- Left Distance walked: Into clinic Assistive device utilized: Single point cane Level of assistance: Modified independence Comments:    VESTIBULAR ASSESSMENT:  GENERAL OBSERVATION: Slow with all movements   SYMPTOM BEHAVIOR:  Subjective history: See above  Non-Vestibular symptoms: None  Type of dizziness: Unsteady with head/body turns and World moves  Frequency: with head movement  Duration: 1-2 sec  Aggravating factors: Induced by motion: turning body quickly and turning head quickly  Relieving factors: head stationary and slow movements  Progression of symptoms: unchanged  OCULOMOTOR EXAM:  Ocular Alignment: normal R lateral neck flexion at baseline  Ocular ROM: No Limitations  Spontaneous Nystagmus: absent  Gaze-Induced Nystagmus: right beating with right gaze  Smooth Pursuits: intact and slightly hypomobile  Saccades: hypometric/undershoots and slow  Cover-cross-cover test: Normal  FRENZEL - FIXATION SUPRESSED: Unable to assess  VESTIBULAR - OCULAR REFLEX:   Slow VOR: Positive Bilaterally  VOR Cancellation: Corrective Saccades  Head-Impulse Test: HIT Right: positive HIT Left: positive  Dynamic Visual Acuity: Not able to be assessed   POSITIONAL TESTING: Right Sidelying: no nystagmus Left Sidelying: no nystagmus  MOTION SENSITIVITY:  Did not assess  FUNCTIONAL GAIT:  5 times sit to stand: 19.21 sec with UE support mCTSIB: Condition 1: 30 sec, condition 2: 30 sec, condition 3: 30 sec, condition 4: 5 sec Mini Bestest: 14                                                                                                                              TREATMENT DATE:  12/16/24 Nustep L5 x 10 min UEs/LEs cues to keep >70 SPM Seated and then standing smooth pursuit horizontal and vertical x30  each Seated and then standing horizontal and vertical x30 each Seated and then standing VOR x1 horizontal and vertical; 2x30 at 100 BPM Standing body rotation with target moving L<>R with body Standing taking a bow with target moving up/down with body  Seated and then standing corrective saccades R&L x30 each (nystagmust most notable with L head rotation)  12/13/24 Nustep L5 x 10 min UEs/LEs Seated: Smooth pursuit horizontal and vertical 2x30 Saccades horizontal and vertical 2x30 VOR x1 horizontal  and vertical; 2x30 at 90 BPM Corrective saccades horizontal 2x30 with increased PT cueing VOR cancellation horizontal and vertical 2x10 each Sit<>stand with eyes looking at target down and then up x10   12/07/24 Self care: see education  PATIENT EDUCATION: Education details: Exam findings, POC, initial HEP, vestibular rehab/balance Person educated: Patient Education method: Explanation, Demonstration, and Handouts Education comprehension: verbalized understanding, returned demonstration, and needs further education  HOME EXERCISE PROGRAM: Access Code: [email or web address] URL: [email or web address] Date: 12/13/2024 Prepared by: Clifford Gibson  Exercises - Seated Horizontal Smooth Pursuit  - 1 x daily - 7 x weekly - 2 sets - 30 sec hold - Seated Vertical Smooth Pursuit  - 1 x daily - 7 x weekly - 2 sets - 30 sec hold - Seated Horizontal Saccades  - 1 x daily - 7 x weekly - 2 sets - 30 sec hold - Seated Vertical Saccades  - 1 x daily - 7 x weekly - 2 sets - 30 sec hold - Seated Gaze Stabilization with Head Rotation  - 1 x daily - 7 x weekly - 2 sets - 30 sec hold - Seated Gaze Stabilization with Head Nod  - 1 x daily - 7 x weekly - 3 sets - 30 sec hold  GOALS: Goals reviewed with patient? Yes  SHORT TERM GOALS: Target date: 01/04/2025   Pt will be ind with initial HEP Baseline: Goal status: INITIAL  2.  Pt will demo improved 5x STS to </=15 sec with UE support  as needed Baseline:  Goal status: INITIAL  3.  Pt will have improved TUG to </=14 sec to demo reduced fall risk Baseline:  Goal status: INITIAL  4.  Pt will be able to perform VOR x 1 at 60 bpm for improved gaze stabilization Baseline:  Goal status: INITIAL   LONG TERM GOALS: Target date: 02/01/2025   Pt will be ind with management and progression of HEP Baseline:  Goal status: INITIAL  2.  Pt will have improved Mini BESTest to >/=23/28 to demo decreased fall risk for his age group Baseline:  Goal status: INITIAL  3.  Pt will have improved 5x STS to </=14 sec without UE support Baseline:  Goal status: INITIAL  4.  Pt will demo improved vestibular integration with standing balance by standing feet together eyes closed on foam for 30 sec Baseline:  Goal status: INITIAL   ASSESSMENT:  CLINICAL IMPRESSION: Reviewed HEP -- can tell pt has been doing them at home with some increased oculomotor movement noted. Pt able to increase head turns and head nods but only able to tolerate up to 100 bpm with VOR and requires cueing to keep his head movements a little bigger. Able to progress pt to practicing exercises in standing.   OBJECTIVE IMPAIRMENTS: Abnormal gait, decreased activity tolerance, decreased balance, decreased coordination, decreased endurance, decreased mobility, difficulty walking, decreased ROM, decreased strength, increased fascial restrictions, increased muscle spasms, improper body mechanics, postural dysfunction, and pain.   ACTIVITY LIMITATIONS: lifting, standing, squatting, stairs, transfers, and locomotion level  PARTICIPATION LIMITATIONS: cleaning, laundry, driving, shopping, and community activity  PERSONAL FACTORS: Age, Fitness, Past/current experiences, and Time since onset of injury/illness/exacerbation are also affecting patient's functional outcome.   REHAB POTENTIAL: Good  CLINICAL DECISION MAKING: Evolving/moderate complexity  EVALUATION COMPLEXITY:  Moderate   PLAN:  PT FREQUENCY: 2x/week  PT DURATION: 8 weeks  PLANNED INTERVENTIONS: 97164- PT Re-evaluation, 97750- Physical Performance Testing, 97110-Therapeutic exercises, 97530- Therapeutic activity, [id number]- Neuromuscular re-education, 97535- Self Care,  02859- Manual therapy, [id number]- Gait training, 02886- Aquatic Therapy, [phone]- Ionotophoresis 4mg /ml Dexamethasone , 79439 (1-2 muscles), 20561 (3+ muscles)- Dry Needling, Patient/Family education, Balance training, Stair training, Taping, Cryotherapy, and Moist heat  PLAN FOR NEXT SESSION: Initiate gaze stabilization/oculomotor exercises. Work on programmer, applications with balance and reactive balance/stepping strategies.    Ladell Lea April Ma L Laquisha Northcraft, PT 12/16/2024, 12:58 PM  "

## 2024-12-18 ENCOUNTER — Ambulatory Visit
Admission: RE | Admit: 2024-12-18 | Discharge: 2024-12-18 | Disposition: A | Source: Ambulatory Visit | Attending: Physician Assistant | Admitting: Physician Assistant

## 2024-12-20 ENCOUNTER — Encounter: Payer: Self-pay | Admitting: Physical Therapy

## 2024-12-20 ENCOUNTER — Ambulatory Visit: Admitting: Physical Therapy

## 2024-12-20 DIAGNOSIS — R2681 Unsteadiness on feet: Secondary | ICD-10-CM

## 2024-12-20 DIAGNOSIS — M6281 Muscle weakness (generalized): Secondary | ICD-10-CM

## 2024-12-20 DIAGNOSIS — R42 Dizziness and giddiness: Secondary | ICD-10-CM

## 2024-12-20 NOTE — Therapy (Signed)
 " OUTPATIENT PHYSICAL THERAPY    Patient Name: Clifford Gibson MRN: [id number] DOB:[birth year], 79 y.o., male Today's Date: 12/20/2024  END OF SESSION:  PT End of Session - 12/20/24 1259     Visit Number 4    Number of Visits 11    Authorization Type [employer]    Authorization Time Period 10 visits approved 12/07/24-03/07/25    Authorization - Number of Visits 10    PT Start Time 1300    PT Stop Time 1340    PT Time Calculation (min) 40 min    Activity Tolerance Patient tolerated treatment well          Past Medical History:  Diagnosis Date   Acute serous otitis media 02/22/2010   Arthritis    Diabetes mellitus without complication (HCC)    ERECTILE DYSFUNCTION 04/25/2009   GERD 03/24/2009   HYPERLIPIDEMIA 03/28/2010   HYPERTENSION 03/24/2009   OBESITY, MODERATE 04/25/2009   PONV (postoperative nausea and vomiting)    Past Surgical History:  Procedure Laterality Date   COLONOSCOPY N/A 06/21/2015   Procedure: COLONOSCOPY;  Surgeon: Claudis RAYMOND Rivet, MD;  Location: [facility];  Service: Endoscopy;  Laterality: N/A;  930 - moved to 7/20 @ 7:30   NASAL SEPTUM SURGERY  1974   TONSILLECTOMY     3 yrs. old   TOTAL KNEE ARTHROPLASTY  06/11/2012   Procedure: TOTAL KNEE ARTHROPLASTY;  Surgeon: Reyes JAYSON Billing, MD;  Location: [facility];  Service: Orthopedics;  Laterality: Right;   Patient Active Problem List   Diagnosis Date Noted   Mild cognitive impairment 06/14/2022   Rhinorrhea 06/14/2022   Type 2 diabetes mellitus, controlled (HCC) 07/04/2015   Hyperglycemia 02/02/2015   Allergic rhinitis 05/14/2013   Hyperlipemia 03/28/2010   ACUTE SEROUS OTITIS MEDIA 02/22/2010   OBESITY, MODERATE 04/25/2009   ERECTILE DYSFUNCTION 04/25/2009   Essential hypertension 03/24/2009   GERD 03/24/2009    PCP: Micheal Wolm ORN, MD REFERRING PROVIDER: Dina Camie BRAVO, PA-C   REFERRING DIAG: R26.9 (ICD-10-CM) - Gait abnormality   THERAPY DIAG:  Unsteadiness on feet  Muscle weakness  (generalized)  Dizziness and giddiness  ONSET DATE: 3 years ago -- feels it started with cholesterol meds  Rationale for Evaluation and Treatment: Rehabilitation  SUBJECTIVE:   SUBJECTIVE STATEMENT: Pt reports no new complaints.  Pt accompanied by: significant other  PERTINENT HISTORY: R TKA, L knee OA  PAIN:  Are you having pain? Chronic knee pain  PRECAUTIONS: Fall  RED FLAGS: None   WEIGHT BEARING RESTRICTIONS: No  FALLS: Has patient fallen in last 6 months? Yes. Number of falls 4 -- mostly caused with quick head turns. Last fall 2-3 weeks ago  LIVING ENVIRONMENT: Lives with: lives with their spouse Lives in: House/apartment Stairs: Basement stairs ~12 Has following equipment at home: Single point cane and Walker - 2 wheeled (uses it sometimes); shower chairs  PLOF: Independent  PATIENT GOALS: Decrease pain, improve movement, improve strength, less difficulty with home activities  OBJECTIVE:  Note: Objective measures were completed at Evaluation unless otherwise noted.  DIAGNOSTIC FINDINGS: n/a  EDEMA:  Mild swelling in feet  POSTURE:  rounded shoulders, forward head, and R lateral neck flexion  Cervical ROM:    Active A/PROM (deg) eval  Flexion   Extension   Right lateral flexion   Left lateral flexion   Right rotation   Left rotation   (Blank rows = not tested)  STRENGTH: See 5x STS  LOWER EXTREMITY MMT:   MMT  Right eval Left eval  Hip flexion    Hip abduction    Hip adduction    Hip internal rotation    Hip external rotation    Knee flexion    Knee extension    Ankle dorsiflexion    Ankle plantarflexion    Ankle inversion    Ankle eversion    (Blank rows = not tested)  BED MOBILITY:  SBA  TRANSFERS: Assistive device utilized: Single point cane  Sit to stand: SBA Stand to sit: SBA Chair to chair: SBA Floor: did not assess   GAIT: Gait pattern: step through pattern, decreased step length- Right, decreased step length-  Left, shuffling, lateral lean- Right, and lateral lean- Left Distance walked: Into clinic Assistive device utilized: Single point cane Level of assistance: Modified independence Comments:    VESTIBULAR ASSESSMENT:  GENERAL OBSERVATION: Slow with all movements   SYMPTOM BEHAVIOR:  Subjective history: See above  Non-Vestibular symptoms: None  Type of dizziness: Unsteady with head/body turns and World moves  Frequency: with head movement  Duration: 1-2 sec  Aggravating factors: Induced by motion: turning body quickly and turning head quickly  Relieving factors: head stationary and slow movements  Progression of symptoms: unchanged  OCULOMOTOR EXAM:  Ocular Alignment: normal R lateral neck flexion at baseline  Ocular ROM: No Limitations  Spontaneous Nystagmus: absent  Gaze-Induced Nystagmus: right beating with right gaze  Smooth Pursuits: intact and slightly hypomobile  Saccades: hypometric/undershoots and slow  Cover-cross-cover test: Normal  FRENZEL - FIXATION SUPRESSED: Unable to assess  VESTIBULAR - OCULAR REFLEX:   Slow VOR: Positive Bilaterally  VOR Cancellation: Corrective Saccades  Head-Impulse Test: HIT Right: positive HIT Left: positive  Dynamic Visual Acuity: Not able to be assessed   POSITIONAL TESTING: Right Sidelying: no nystagmus Left Sidelying: no nystagmus  MOTION SENSITIVITY:  Did not assess  FUNCTIONAL GAIT:  5 times sit to stand: 19.21 sec with UE support mCTSIB: Condition 1: 30 sec, condition 2: 30 sec, condition 3: 30 sec, condition 4: 5 sec Mini Bestest: 14                                                                                                                              TREATMENT DATE:  12/20/24 Nustep L5 x10 min UEs/LEs >70 SPM Standing smooth pursuit horizontal and vertical 100 bpm 2x30 each Standing horizontal and vertical saccades 2x30 Standing and seated VOR x1 horizontal and vertical 2x30 focusing on increasing neck  ROM Sitting eyes closed big head nods and head turns 2x30 Standing body rotation with target moving L<>R Standing feet apart eyes closed 2x30 Standing feet apart eyes closed head nods and head turns 2x30  12/16/24 Nustep L5 x 10 min UEs/LEs cues to keep >70 SPM Seated and then standing smooth pursuit horizontal and vertical x30 each Seated and then standing horizontal and vertical x30 each Seated and then standing VOR x1 horizontal and vertical; 2x30 at 100 BPM Standing body rotation  with target moving L<>R with body Standing taking a bow with target moving up/down with body  Seated and then standing corrective saccades R&L x30 each (nystagmust most notable with L head rotation)  12/13/24 Nustep L5 x 10 min UEs/LEs Seated: Smooth pursuit horizontal and vertical 2x30 Saccades horizontal and vertical 2x30 VOR x1 horizontal and vertical; 2x30 at 90 BPM Corrective saccades horizontal 2x30 with increased PT cueing VOR cancellation horizontal and vertical 2x10 each Sit<>stand with eyes looking at target down and then up x10   12/07/24 Self care: see education  PATIENT EDUCATION: Education details: Exam findings, POC, initial HEP, vestibular rehab/balance Person educated: Patient Education method: Explanation, Demonstration, and Handouts Education comprehension: verbalized understanding, returned demonstration, and needs further education  HOME EXERCISE PROGRAM: Access Code: [email or web address] URL: [email or web address] Date: 12/20/2024 Prepared by: Georgeanna Radziewicz April Marie Khori Rosevear  Exercises - Standing Feet Together Smooth Pursuit  - 1 x daily - 7 x weekly - 2 sets - 30 sec hold - Standing Horizontal Saccades  - 1 x daily - 7 x weekly - 2 sets - 30 sec hold - Standing Vertical Saccades  - 1 x daily - 7 x weekly - 2 sets - 30 sec hold - Standing Gaze Stabilization with Head Rotation  - 1 x daily - 7 x weekly - 2 sets - 30 sec hold - Standing Gaze Stabilization with Head Nod   - 1 x daily - 7 x weekly - 2 sets - 30 sec hold - Corner Balance Feet Apart: Eyes Closed With Head Turns  - 1 x daily - 7 x weekly - 2 sets - 30 sec hold  GOALS: Goals reviewed with patient? Yes  SHORT TERM GOALS: Target date: 01/04/2025   Pt will be ind with initial HEP Baseline: Goal status: INITIAL  2.  Pt will demo improved 5x STS to </=15 sec with UE support as needed Baseline:  Goal status: INITIAL  3.  Pt will have improved TUG to </=14 sec to demo reduced fall risk Baseline:  Goal status: INITIAL  4.  Pt will be able to perform VOR x 1 at 60 bpm for improved gaze stabilization Baseline:  Goal status: INITIAL   LONG TERM GOALS: Target date: 02/01/2025   Pt will be ind with management and progression of HEP Baseline:  Goal status: INITIAL  2.  Pt will have improved Mini BESTest to >/=23/28 to demo decreased fall risk for his age group Baseline:  Goal status: INITIAL  3.  Pt will have improved 5x STS to </=14 sec without UE support Baseline:  Goal status: INITIAL  4.  Pt will demo improved vestibular integration with standing balance by standing feet together eyes closed on foam for 30 sec Baseline:  Goal status: INITIAL   ASSESSMENT:  CLINICAL IMPRESSION: Continued to work on improving oculomotor control and endurance -- can see more corrective saccades with increased time. Working on improving gaze stabilization with bigger head turns. Requires cueing to turn head/neck vs body. Progressed pt to all standing exercises.   OBJECTIVE IMPAIRMENTS: Abnormal gait, decreased activity tolerance, decreased balance, decreased coordination, decreased endurance, decreased mobility, difficulty walking, decreased ROM, decreased strength, increased fascial restrictions, increased muscle spasms, improper body mechanics, postural dysfunction, and pain.   ACTIVITY LIMITATIONS: lifting, standing, squatting, stairs, transfers, and locomotion level  PARTICIPATION LIMITATIONS:  cleaning, laundry, driving, shopping, and community activity  PERSONAL FACTORS: Age, Fitness, Past/current experiences, and Time since onset of injury/illness/exacerbation are also affecting patient's functional outcome.   REHAB POTENTIAL:  Good  CLINICAL DECISION MAKING: Evolving/moderate complexity  EVALUATION COMPLEXITY: Moderate   PLAN:  PT FREQUENCY: 2x/week  PT DURATION: 8 weeks  PLANNED INTERVENTIONS: 97164- PT Re-evaluation, 97750- Physical Performance Testing, 97110-Therapeutic exercises, 97530- Therapeutic activity, [id number]- Neuromuscular re-education, 97535- Self Care, [id number]- Manual therapy, [id number]- Gait training, [phone]- Aquatic Therapy, [phone]- Ionotophoresis 4mg /ml Dexamethasone , 79439 (1-2 muscles), 20561 (3+ muscles)- Dry Needling, Patient/Family education, Balance training, Stair training, Taping, Cryotherapy, and Moist heat  PLAN FOR NEXT SESSION: Initiate gaze stabilization/oculomotor exercises. Work on programmer, applications with balance and reactive balance/stepping strategies.    Gionna Polak April Ma L Airyonna Franklyn, PT 12/20/2024, 1:05 PM  "

## 2024-12-23 ENCOUNTER — Encounter: Payer: Self-pay | Admitting: Physical Therapy

## 2024-12-23 ENCOUNTER — Ambulatory Visit: Admitting: Physical Therapy

## 2024-12-23 DIAGNOSIS — R42 Dizziness and giddiness: Secondary | ICD-10-CM

## 2024-12-23 DIAGNOSIS — M6281 Muscle weakness (generalized): Secondary | ICD-10-CM

## 2024-12-23 DIAGNOSIS — R2681 Unsteadiness on feet: Secondary | ICD-10-CM | POA: Diagnosis not present

## 2024-12-23 NOTE — Therapy (Signed)
 " OUTPATIENT PHYSICAL THERAPY    Patient Name: Clifford Gibson MRN: [id number] DOB:07-01-1946, 79 y.o., male Today's Date: 12/23/2024  END OF SESSION:  PT End of Session - 12/23/24 1303     Visit Number 5    Number of Visits 11    Authorization Type [employer]    Authorization Time Period 10 visits approved 12/07/24-03/07/25    Authorization - Number of Visits 10    PT Start Time 1300    PT Stop Time 1340    PT Time Calculation (min) 40 min    Activity Tolerance Patient tolerated treatment well          Past Medical History:  Diagnosis Date   Acute serous otitis media 02/22/2010   Arthritis    Diabetes mellitus without complication (HCC)    ERECTILE DYSFUNCTION 04/25/2009   GERD 03/24/2009   HYPERLIPIDEMIA 03/28/2010   HYPERTENSION 03/24/2009   OBESITY, MODERATE 04/25/2009   PONV (postoperative nausea and vomiting)    Past Surgical History:  Procedure Laterality Date   COLONOSCOPY N/A 06/21/2015   Procedure: COLONOSCOPY;  Surgeon: Claudis RAYMOND Rivet, MD;  Location: [facility];  Service: Endoscopy;  Laterality: N/A;  930 - moved to 7/20 @ 7:30   NASAL SEPTUM SURGERY  1974   TONSILLECTOMY     3 yrs. old   TOTAL KNEE ARTHROPLASTY  06/11/2012   Procedure: TOTAL KNEE ARTHROPLASTY;  Surgeon: Reyes JAYSON Billing, MD;  Location: [facility];  Service: Orthopedics;  Laterality: Right;   Patient Active Problem List   Diagnosis Date Noted   Mild cognitive impairment 06/14/2022   Rhinorrhea 06/14/2022   Type 2 diabetes mellitus, controlled (HCC) 07/04/2015   Hyperglycemia 02/02/2015   Allergic rhinitis 05/14/2013   Hyperlipemia 03/28/2010   ACUTE SEROUS OTITIS MEDIA 02/22/2010   OBESITY, MODERATE 04/25/2009   ERECTILE DYSFUNCTION 04/25/2009   Essential hypertension 03/24/2009   GERD 03/24/2009    PCP: Micheal Wolm ORN, MD REFERRING PROVIDER: Dina Camie BRAVO, PA-C   REFERRING DIAG: R26.9 (ICD-10-CM) - Gait abnormality   THERAPY DIAG:  Unsteadiness on feet  Muscle weakness  (generalized)  Dizziness and giddiness  ONSET DATE: 3 years ago -- feels it started with cholesterol meds  Rationale for Evaluation and Treatment: Rehabilitation  SUBJECTIVE:   SUBJECTIVE STATEMENT: Pt reports no new complaints.  Pt accompanied by: significant other  PERTINENT HISTORY: R TKA, L knee OA  PAIN:  Are you having pain? Chronic knee pain  PRECAUTIONS: Fall  RED FLAGS: None   WEIGHT BEARING RESTRICTIONS: No  FALLS: Has patient fallen in last 6 months? Yes. Number of falls 4 -- mostly caused with quick head turns. Last fall 2-3 weeks ago  LIVING ENVIRONMENT: Lives with: lives with their spouse Lives in: House/apartment Stairs: Basement stairs ~12 Has following equipment at home: Single point cane and Walker - 2 wheeled (uses it sometimes); shower chairs  PLOF: Independent  PATIENT GOALS: Decrease pain, improve movement, improve strength, less difficulty with home activities  OBJECTIVE:  Note: Objective measures were completed at Evaluation unless otherwise noted.  DIAGNOSTIC FINDINGS: n/a  EDEMA:  Mild swelling in feet  POSTURE:  rounded shoulders, forward head, and R lateral neck flexion  Cervical ROM:    Active A/PROM (deg) eval  Flexion   Extension   Right lateral flexion   Left lateral flexion   Right rotation   Left rotation   (Blank rows = not tested)  STRENGTH: See 5x STS  LOWER EXTREMITY MMT:   MMT  Right eval Left eval  Hip flexion    Hip abduction    Hip adduction    Hip internal rotation    Hip external rotation    Knee flexion    Knee extension    Ankle dorsiflexion    Ankle plantarflexion    Ankle inversion    Ankle eversion    (Blank rows = not tested)  BED MOBILITY:  SBA  TRANSFERS: Assistive device utilized: Single point cane  Sit to stand: SBA Stand to sit: SBA Chair to chair: SBA Floor: did not assess   GAIT: Gait pattern: step through pattern, decreased step length- Right, decreased step length-  Left, shuffling, lateral lean- Right, and lateral lean- Left Distance walked: Into clinic Assistive device utilized: Single point cane Level of assistance: Modified independence Comments:    VESTIBULAR ASSESSMENT:  GENERAL OBSERVATION: Slow with all movements   SYMPTOM BEHAVIOR:  Subjective history: See above  Non-Vestibular symptoms: None  Type of dizziness: Unsteady with head/body turns and World moves  Frequency: with head movement  Duration: 1-2 sec  Aggravating factors: Induced by motion: turning body quickly and turning head quickly  Relieving factors: head stationary and slow movements  Progression of symptoms: unchanged  OCULOMOTOR EXAM:  Ocular Alignment: normal R lateral neck flexion at baseline  Ocular ROM: No Limitations  Spontaneous Nystagmus: absent  Gaze-Induced Nystagmus: right beating with right gaze  Smooth Pursuits: intact and slightly hypomobile  Saccades: hypometric/undershoots and slow  Cover-cross-cover test: Normal  FRENZEL - FIXATION SUPRESSED: Unable to assess  VESTIBULAR - OCULAR REFLEX:   Slow VOR: Positive Bilaterally  VOR Cancellation: Corrective Saccades  Head-Impulse Test: HIT Right: positive HIT Left: positive  Dynamic Visual Acuity: Not able to be assessed   POSITIONAL TESTING: Right Sidelying: no nystagmus Left Sidelying: no nystagmus  MOTION SENSITIVITY:  Did not assess  FUNCTIONAL GAIT:  5 times sit to stand: 19.21 sec with UE support mCTSIB: Condition 1: 30 sec, condition 2: 30 sec, condition 3: 30 sec, condition 4: 5 sec Mini Bestest: 14                                                                                                                              TREATMENT DATE:  12/23/24 Nustep L5 x 15 min UEs/LEs  Standing smooth pursuit horizontal and vertical 100 bpm 2x30 each Standing horizontal and vertical saccades x30 Marching with UE assist horizontal and vertical saccades 2x30 each Standing VOR x 1  horizontal 100 bpm 2x30, 110 bpm x30; vertical 110 bpm 2x30 Standing vestibular ball CW & CCW x10    12/20/24 Nustep L5 x10 min UEs/LEs >70 SPM Standing smooth pursuit horizontal and vertical 100 bpm 2x30 each Standing horizontal and vertical saccades 2x30 Standing and seated VOR x1 horizontal and vertical 2x30 focusing on increasing neck ROM Sitting eyes closed big head nods and head turns 2x30 Standing body rotation with target moving L<>R Standing feet apart eyes closed 2x30 Standing  feet apart eyes closed head nods and head turns 2x30  12/16/24 Nustep L5 x 10 min UEs/LEs cues to keep >70 SPM Seated and then standing smooth pursuit horizontal and vertical x30 each Seated and then standing horizontal and vertical x30 each Seated and then standing VOR x1 horizontal and vertical; 2x30 at 100 BPM Standing body rotation with target moving L<>R with body Standing taking a bow with target moving up/down with body  Seated and then standing corrective saccades R&L x30 each (nystagmust most notable with L head rotation)   PATIENT EDUCATION: Education details: Exam findings, POC, initial HEP, vestibular rehab/balance Person educated: Patient Education method: Explanation, Demonstration, and Handouts Education comprehension: verbalized understanding, returned demonstration, and needs further education  HOME EXERCISE PROGRAM: Access Code: [email or web address] URL: [email or web address] Date: 12/20/2024 Prepared by: Birtha Hatler April Marie Jyquan Kenley  Exercises - Standing Feet Together Smooth Pursuit  - 1 x daily - 7 x weekly - 2 sets - 30 sec hold - Standing Horizontal Saccades  - 1 x daily - 7 x weekly - 2 sets - 30 sec hold - Standing Vertical Saccades  - 1 x daily - 7 x weekly - 2 sets - 30 sec hold - Standing Gaze Stabilization with Head Rotation  - 1 x daily - 7 x weekly - 2 sets - 30 sec hold - Standing Gaze Stabilization with Head Nod  - 1 x daily - 7 x weekly - 2 sets - 30  sec hold - Corner Balance Feet Apart: Eyes Closed With Head Turns  - 1 x daily - 7 x weekly - 2 sets - 30 sec hold  GOALS: Goals reviewed with patient? Yes  SHORT TERM GOALS: Target date: 01/04/2025   Pt will be ind with initial HEP Baseline: Goal status: INITIAL  2.  Pt will demo improved 5x STS to </=15 sec with UE support as needed Baseline:  Goal status: INITIAL  3.  Pt will have improved TUG to </=14 sec to demo reduced fall risk Baseline:  Goal status: INITIAL  4.  Pt will be able to perform VOR x 1 at 60 bpm for improved gaze stabilization Baseline:  Goal status: INITIAL   LONG TERM GOALS: Target date: 02/01/2025   Pt will be ind with management and progression of HEP Baseline:  Goal status: INITIAL  2.  Pt will have improved Mini BESTest to >/=23/28 to demo decreased fall risk for his age group Baseline:  Goal status: INITIAL  3.  Pt will have improved 5x STS to </=14 sec without UE support Baseline:  Goal status: INITIAL  4.  Pt will demo improved vestibular integration with standing balance by standing feet together eyes closed on foam for 30 sec Baseline:  Goal status: INITIAL   ASSESSMENT:  CLINICAL IMPRESSION: Progressing pt's gaze stabilization and oculomotor exercises. Added marching to add dynamic balance with his other vestibular exercises. Requires cueing to increase and maintain smooth head/neck movement.   OBJECTIVE IMPAIRMENTS: Abnormal gait, decreased activity tolerance, decreased balance, decreased coordination, decreased endurance, decreased mobility, difficulty walking, decreased ROM, decreased strength, increased fascial restrictions, increased muscle spasms, improper body mechanics, postural dysfunction, and pain.   ACTIVITY LIMITATIONS: lifting, standing, squatting, stairs, transfers, and locomotion level  PARTICIPATION LIMITATIONS: cleaning, laundry, driving, shopping, and community activity  PERSONAL FACTORS: Age, Fitness,  Past/current experiences, and Time since onset of injury/illness/exacerbation are also affecting patient's functional outcome.   REHAB POTENTIAL: Good  CLINICAL DECISION MAKING: Evolving/moderate complexity  EVALUATION COMPLEXITY: Moderate   PLAN:  PT FREQUENCY: 2x/week  PT DURATION: 8 weeks  PLANNED INTERVENTIONS: 97164- PT Re-evaluation, 97750- Physical Performance Testing, 97110-Therapeutic exercises, 97530- Therapeutic activity, [id number]- Neuromuscular re-education, 97535- Self Care, 02859- Manual therapy, [id number]- Gait training, [phone]- Aquatic Therapy, [phone]- Ionotophoresis 4mg /ml Dexamethasone , 79439 (1-2 muscles), 20561 (3+ muscles)- Dry Needling, Patient/Family education, Balance training, Stair training, Taping, Cryotherapy, and Moist heat  PLAN FOR NEXT SESSION: Initiate gaze stabilization/oculomotor exercises. Work on programmer, applications with balance and reactive balance/stepping strategies.    Brigido Mera April Ma L Azazel Franze, PT 12/23/2024, 1:13 PM  "

## 2024-12-27 ENCOUNTER — Ambulatory Visit: Admitting: Physical Therapy

## 2024-12-29 ENCOUNTER — Other Ambulatory Visit: Payer: Self-pay | Admitting: Family Medicine

## 2024-12-30 ENCOUNTER — Ambulatory Visit: Admitting: *Deleted

## 2025-01-10 ENCOUNTER — Ambulatory Visit: Attending: Physician Assistant | Admitting: Physical Therapy

## 2025-01-12 ENCOUNTER — Ambulatory Visit

## 2025-01-17 ENCOUNTER — Ambulatory Visit: Admitting: Physical Therapy

## 2025-01-20 ENCOUNTER — Ambulatory Visit

## 2025-01-24 ENCOUNTER — Ambulatory Visit: Admitting: Physical Therapy

## 2025-02-02 ENCOUNTER — Ambulatory Visit: Payer: Medicare PPO

## 2025-02-28 ENCOUNTER — Ambulatory Visit: Payer: Self-pay | Admitting: Physician Assistant
